# Patient Record
Sex: Male | Born: 1940 | Hispanic: No | Marital: Married | Smoking: Former smoker
Health system: Southern US, Community
[De-identification: ages and names within clinical notes are randomized; demographics above are authoritative.]

## PROBLEM LIST (undated history)

## (undated) DIAGNOSIS — M479 Spondylosis, unspecified: Secondary | ICD-10-CM

## (undated) DIAGNOSIS — K5900 Constipation, unspecified: Secondary | ICD-10-CM

## (undated) DIAGNOSIS — I10 Essential (primary) hypertension: Secondary | ICD-10-CM

## (undated) DIAGNOSIS — E7800 Pure hypercholesterolemia, unspecified: Secondary | ICD-10-CM

## (undated) DIAGNOSIS — Z9289 Personal history of other medical treatment: Secondary | ICD-10-CM

## (undated) DIAGNOSIS — R030 Elevated blood-pressure reading, without diagnosis of hypertension: Secondary | ICD-10-CM

## (undated) HISTORY — PX: OTHER SURGICAL HISTORY: SHX169

## (undated) HISTORY — DX: Pure hypercholesterolemia, unspecified: E78.00

## (undated) HISTORY — DX: Elevated blood-pressure reading, without diagnosis of hypertension: R03.0

## (undated) HISTORY — DX: Personal history of other medical treatment: Z92.89

---

## 2002-03-26 ENCOUNTER — Ambulatory Visit (HOSPITAL_COMMUNITY): Admission: RE | Admit: 2002-03-26 | Discharge: 2002-03-26 | Payer: Self-pay | Admitting: *Deleted

## 2005-08-14 ENCOUNTER — Ambulatory Visit (HOSPITAL_COMMUNITY): Admission: RE | Admit: 2005-08-14 | Discharge: 2005-08-14 | Payer: Self-pay | Admitting: Urology

## 2005-10-08 ENCOUNTER — Inpatient Hospital Stay (HOSPITAL_COMMUNITY): Admission: RE | Admit: 2005-10-08 | Discharge: 2005-10-09 | Payer: Self-pay | Admitting: Urology

## 2010-10-25 ENCOUNTER — Ambulatory Visit (HOSPITAL_BASED_OUTPATIENT_CLINIC_OR_DEPARTMENT_OTHER)
Admission: RE | Admit: 2010-10-25 | Discharge: 2010-10-25 | Disposition: A | Discharge status: Home / Self Care | Payer: Medicare Other | Source: Ambulatory Visit | Attending: Orthopedic Surgery | Admitting: Orthopedic Surgery

## 2010-10-25 ENCOUNTER — Other Ambulatory Visit: Payer: Self-pay | Admitting: Orthopedic Surgery

## 2010-10-25 DIAGNOSIS — Z01812 Encounter for preprocedural laboratory examination: Secondary | ICD-10-CM | POA: Insufficient documentation

## 2010-10-25 DIAGNOSIS — X58XXXA Exposure to other specified factors, initial encounter: Secondary | ICD-10-CM | POA: Insufficient documentation

## 2010-10-25 DIAGNOSIS — Y929 Unspecified place or not applicable: Secondary | ICD-10-CM | POA: Insufficient documentation

## 2010-10-25 DIAGNOSIS — IMO0002 Reserved for concepts with insufficient information to code with codable children: Secondary | ICD-10-CM | POA: Insufficient documentation

## 2010-10-25 DIAGNOSIS — D2110 Benign neoplasm of connective and other soft tissue of unspecified upper limb, including shoulder: Secondary | ICD-10-CM | POA: Insufficient documentation

## 2010-10-25 DIAGNOSIS — S65509A Unspecified injury of blood vessel of unspecified finger, initial encounter: Secondary | ICD-10-CM | POA: Insufficient documentation

## 2010-10-25 DIAGNOSIS — S61209A Unspecified open wound of unspecified finger without damage to nail, initial encounter: Secondary | ICD-10-CM | POA: Insufficient documentation

## 2010-10-25 LAB — POCT HEMOGLOBIN-HEMACUE: Hemoglobin: 15.4 g/dL (ref 13.0–17.0)

## 2010-12-05 NOTE — Op Note (Signed)
NAMEANTION, ANDRES                 ACCOUNT NO.:  [id number]  MEDICAL RECORD NO.:  [id number]  LOCATION:                                 FACILITY:  PHYSICIAN:  Cindee Salt, M.D.            DATE OF BIRTH:  DATE OF PROCEDURE:  10/25/2010 DATE OF DISCHARGE:                              OPERATIVE REPORT   PREOPERATIVE DIAGNOSIS:  Laceration digital artery nerve, right index finger.  POSTOPERATIVE DIAGNOSIS:  Laceration digital artery nerve, right index finger.  OPERATION:  Excision of neuroma, repair of radial digital nerve with conduit, repair of radial digital artery, right index finger.  SURGEON:  Cindee Salt, MD  ANESTHESIA:  General with local infiltration.  ANESTHESIOLOGIST:  Bedelia Person, MD  HISTORY:  The patient is a 70 year old male with a history of a laceration to the palmar aspect metacarpophalangeal joint of the right index finger.  He complains of numbness and tingling on the radial aspect of the digit.  He is admitted now for exploration, repair, possible conduit grafting.  Pre, peri, and postoperative course have been discussed along with risks and complications.  He is aware that there is no guarantee with surgery, possibility of infection, recurrence injury to arteries, nerves, tendons, incomplete relief of symptoms, dystrophy.  In the preoperative area, the patient is seen.  The extremity marked by both the patient and surgeon.  Antibiotic given.  DESCRIPTION OF PROCEDURE:  The patient was brought to the operating room where a general anesthetic was carried out without difficulty.  He was prepped using ChloraPrep, supine position with right arm free.  3-minute dry time was allowed.  Time-out taken confirming the patient and procedure.  The limb was exsanguinated with an Esmarch bandage. Tourniquet placed high and the arm was then plated to 250 mmHg.  A volar Brunner incision was made, carried down through subcutaneous tissue. Bleeders were electrocauterized  with bipolar.  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  The radial digital artery was noted to be lacerated.  The dorsal sensory branch of the nerve had branched, this was protected.  The ulnar digital nerve was intact. Flexor tendons appeared intact.  The operative microscope was brought into position.  The nerve was then traced distally and proximally identifying both stumps.  The neuroma was then excised by grasping this with a cardboard cover from a suture pack.  A 15-blade was used to transect the nerve multiple times to take this back with minimal dissection to fascicles.  This was done over a tongue depressor to protect the underlying structures.  The distal nerve was similarly cut back to normal fascicles removing any scar.  The ends of the artery were cut back to normal intima.  This was then irrigated, dilated, and a repair performed at the back wall first technique with interrupted 9-0 nylon sutures.  A significant gap was present in the nerve on resection of the neuroma despite mobilization of the nerve.  A 3-mm conduit was measured.  This was fit proximally well, distally left a gap in the conduit.  The conduit was then placed, cut short to minimize the gap and  conduit placement.  The proximal end was sutured in with a horizontal mattress suture.  This filled the conduit well.  Conduit was then irrigated with saline.  The distal end was then sutured in similarly with horizontal mattress suture bringing it into the conduit.  A fat pad plug was then placed distally to decrease the space present between the actual nerve and conduit.  The wound was copiously irrigated with saline.  The skin then closed with interrupted of 5-0 Vicryl Rapide sutures.  A sterile compressive dressing of dorsal splint forearm based was placed maintaining the finger in a flexed position.  On deflation of the tourniquet, all fingers immediately pinked, and he was taken to the recovery  room for observation in a satisfactory condition.  He will be discharged home to return to the [facility] [place] in 1 week on Vicodin.          ______________________________ Cindee Salt, M.D.     GK/MEDQ  D:  10/25/2010  T:  10/25/2010  Job:  [id number]  Electronically Signed by Cindee Salt M.D. on 12/05/2010 04:38:34 PM

## 2011-03-30 DIAGNOSIS — E785 Hyperlipidemia, unspecified: Secondary | ICD-10-CM | POA: Diagnosis not present

## 2011-03-30 DIAGNOSIS — Z125 Encounter for screening for malignant neoplasm of prostate: Secondary | ICD-10-CM | POA: Diagnosis not present

## 2011-03-30 DIAGNOSIS — R7301 Impaired fasting glucose: Secondary | ICD-10-CM | POA: Diagnosis not present

## 2011-04-06 DIAGNOSIS — G252 Other specified forms of tremor: Secondary | ICD-10-CM | POA: Diagnosis not present

## 2011-04-06 DIAGNOSIS — G250 Essential tremor: Secondary | ICD-10-CM | POA: Diagnosis not present

## 2011-04-06 DIAGNOSIS — M459 Ankylosing spondylitis of unspecified sites in spine: Secondary | ICD-10-CM | POA: Diagnosis not present

## 2011-04-06 DIAGNOSIS — E785 Hyperlipidemia, unspecified: Secondary | ICD-10-CM | POA: Diagnosis not present

## 2011-04-06 DIAGNOSIS — Z23 Encounter for immunization: Secondary | ICD-10-CM | POA: Diagnosis not present

## 2011-04-06 DIAGNOSIS — R7301 Impaired fasting glucose: Secondary | ICD-10-CM | POA: Diagnosis not present

## 2011-07-04 DIAGNOSIS — M1990 Unspecified osteoarthritis, unspecified site: Secondary | ICD-10-CM | POA: Diagnosis not present

## 2011-07-04 DIAGNOSIS — M25549 Pain in joints of unspecified hand: Secondary | ICD-10-CM | POA: Diagnosis not present

## 2011-07-04 DIAGNOSIS — M79609 Pain in unspecified limb: Secondary | ICD-10-CM | POA: Diagnosis not present

## 2011-07-04 DIAGNOSIS — M19079 Primary osteoarthritis, unspecified ankle and foot: Secondary | ICD-10-CM | POA: Diagnosis not present

## 2011-07-04 DIAGNOSIS — M19049 Primary osteoarthritis, unspecified hand: Secondary | ICD-10-CM | POA: Diagnosis not present

## 2011-07-04 DIAGNOSIS — M459 Ankylosing spondylitis of unspecified sites in spine: Secondary | ICD-10-CM | POA: Diagnosis not present

## 2011-10-04 DIAGNOSIS — Z961 Presence of intraocular lens: Secondary | ICD-10-CM | POA: Diagnosis not present

## 2011-10-04 DIAGNOSIS — R7301 Impaired fasting glucose: Secondary | ICD-10-CM | POA: Diagnosis not present

## 2011-10-04 DIAGNOSIS — E785 Hyperlipidemia, unspecified: Secondary | ICD-10-CM | POA: Diagnosis not present

## 2011-11-01 DIAGNOSIS — G252 Other specified forms of tremor: Secondary | ICD-10-CM | POA: Diagnosis not present

## 2011-11-01 DIAGNOSIS — G250 Essential tremor: Secondary | ICD-10-CM | POA: Diagnosis not present

## 2011-11-01 DIAGNOSIS — E785 Hyperlipidemia, unspecified: Secondary | ICD-10-CM | POA: Diagnosis not present

## 2011-11-01 DIAGNOSIS — R7301 Impaired fasting glucose: Secondary | ICD-10-CM | POA: Diagnosis not present

## 2011-11-01 DIAGNOSIS — N182 Chronic kidney disease, stage 2 (mild): Secondary | ICD-10-CM | POA: Diagnosis not present

## 2011-11-12 DIAGNOSIS — M159 Polyosteoarthritis, unspecified: Secondary | ICD-10-CM | POA: Diagnosis not present

## 2011-11-12 DIAGNOSIS — M459 Ankylosing spondylitis of unspecified sites in spine: Secondary | ICD-10-CM | POA: Diagnosis not present

## 2011-11-16 DIAGNOSIS — C61 Malignant neoplasm of prostate: Secondary | ICD-10-CM | POA: Diagnosis not present

## 2011-11-23 DIAGNOSIS — C61 Malignant neoplasm of prostate: Secondary | ICD-10-CM | POA: Diagnosis not present

## 2011-11-23 DIAGNOSIS — N529 Male erectile dysfunction, unspecified: Secondary | ICD-10-CM | POA: Diagnosis not present

## 2012-01-16 DIAGNOSIS — Z23 Encounter for immunization: Secondary | ICD-10-CM | POA: Diagnosis not present

## 2012-01-16 DIAGNOSIS — L821 Other seborrheic keratosis: Secondary | ICD-10-CM | POA: Diagnosis not present

## 2012-01-16 DIAGNOSIS — D485 Neoplasm of uncertain behavior of skin: Secondary | ICD-10-CM | POA: Diagnosis not present

## 2012-01-16 DIAGNOSIS — L723 Sebaceous cyst: Secondary | ICD-10-CM | POA: Diagnosis not present

## 2012-02-11 DIAGNOSIS — L570 Actinic keratosis: Secondary | ICD-10-CM | POA: Diagnosis not present

## 2012-02-11 DIAGNOSIS — D239 Other benign neoplasm of skin, unspecified: Secondary | ICD-10-CM | POA: Diagnosis not present

## 2012-02-11 DIAGNOSIS — L820 Inflamed seborrheic keratosis: Secondary | ICD-10-CM | POA: Diagnosis not present

## 2012-04-21 DIAGNOSIS — E785 Hyperlipidemia, unspecified: Secondary | ICD-10-CM | POA: Diagnosis not present

## 2012-04-21 DIAGNOSIS — R7301 Impaired fasting glucose: Secondary | ICD-10-CM | POA: Diagnosis not present

## 2012-04-21 DIAGNOSIS — Z125 Encounter for screening for malignant neoplasm of prostate: Secondary | ICD-10-CM | POA: Diagnosis not present

## 2012-04-29 DIAGNOSIS — G250 Essential tremor: Secondary | ICD-10-CM | POA: Diagnosis not present

## 2012-04-29 DIAGNOSIS — R7301 Impaired fasting glucose: Secondary | ICD-10-CM | POA: Diagnosis not present

## 2012-04-29 DIAGNOSIS — N182 Chronic kidney disease, stage 2 (mild): Secondary | ICD-10-CM | POA: Diagnosis not present

## 2012-04-29 DIAGNOSIS — E785 Hyperlipidemia, unspecified: Secondary | ICD-10-CM | POA: Diagnosis not present

## 2012-09-16 DIAGNOSIS — M19029 Primary osteoarthritis, unspecified elbow: Secondary | ICD-10-CM | POA: Diagnosis not present

## 2012-09-16 DIAGNOSIS — M25529 Pain in unspecified elbow: Secondary | ICD-10-CM | POA: Diagnosis not present

## 2012-09-24 DIAGNOSIS — M25529 Pain in unspecified elbow: Secondary | ICD-10-CM | POA: Diagnosis not present

## 2012-09-24 DIAGNOSIS — M459 Ankylosing spondylitis of unspecified sites in spine: Secondary | ICD-10-CM | POA: Diagnosis not present

## 2012-09-24 DIAGNOSIS — M159 Polyosteoarthritis, unspecified: Secondary | ICD-10-CM | POA: Diagnosis not present

## 2012-10-24 DIAGNOSIS — R7301 Impaired fasting glucose: Secondary | ICD-10-CM | POA: Diagnosis not present

## 2012-10-24 DIAGNOSIS — E785 Hyperlipidemia, unspecified: Secondary | ICD-10-CM | POA: Diagnosis not present

## 2012-11-06 DIAGNOSIS — Z961 Presence of intraocular lens: Secondary | ICD-10-CM | POA: Diagnosis not present

## 2012-11-06 DIAGNOSIS — R7301 Impaired fasting glucose: Secondary | ICD-10-CM | POA: Diagnosis not present

## 2012-11-06 DIAGNOSIS — H43819 Vitreous degeneration, unspecified eye: Secondary | ICD-10-CM | POA: Diagnosis not present

## 2012-11-06 DIAGNOSIS — H04129 Dry eye syndrome of unspecified lacrimal gland: Secondary | ICD-10-CM | POA: Diagnosis not present

## 2012-11-06 DIAGNOSIS — N182 Chronic kidney disease, stage 2 (mild): Secondary | ICD-10-CM | POA: Diagnosis not present

## 2012-11-06 DIAGNOSIS — G250 Essential tremor: Secondary | ICD-10-CM | POA: Diagnosis not present

## 2012-11-06 DIAGNOSIS — E785 Hyperlipidemia, unspecified: Secondary | ICD-10-CM | POA: Diagnosis not present

## 2012-11-19 DIAGNOSIS — M25529 Pain in unspecified elbow: Secondary | ICD-10-CM | POA: Diagnosis not present

## 2012-11-19 DIAGNOSIS — M19029 Primary osteoarthritis, unspecified elbow: Secondary | ICD-10-CM | POA: Diagnosis not present

## 2012-12-01 DIAGNOSIS — M25529 Pain in unspecified elbow: Secondary | ICD-10-CM | POA: Diagnosis not present

## 2012-12-08 DIAGNOSIS — M25529 Pain in unspecified elbow: Secondary | ICD-10-CM | POA: Diagnosis not present

## 2013-01-09 DIAGNOSIS — Z23 Encounter for immunization: Secondary | ICD-10-CM | POA: Diagnosis not present

## 2013-01-21 DIAGNOSIS — K219 Gastro-esophageal reflux disease without esophagitis: Secondary | ICD-10-CM | POA: Diagnosis not present

## 2013-01-21 DIAGNOSIS — Z1211 Encounter for screening for malignant neoplasm of colon: Secondary | ICD-10-CM | POA: Diagnosis not present

## 2013-02-10 DIAGNOSIS — L570 Actinic keratosis: Secondary | ICD-10-CM | POA: Diagnosis not present

## 2013-02-19 DIAGNOSIS — Z1211 Encounter for screening for malignant neoplasm of colon: Secondary | ICD-10-CM | POA: Diagnosis not present

## 2013-02-19 DIAGNOSIS — K6389 Other specified diseases of intestine: Secondary | ICD-10-CM | POA: Diagnosis not present

## 2013-02-19 DIAGNOSIS — K5000 Crohn's disease of small intestine without complications: Secondary | ICD-10-CM | POA: Diagnosis not present

## 2013-02-19 DIAGNOSIS — K5730 Diverticulosis of large intestine without perforation or abscess without bleeding: Secondary | ICD-10-CM | POA: Diagnosis not present

## 2013-02-19 DIAGNOSIS — K5289 Other specified noninfective gastroenteritis and colitis: Secondary | ICD-10-CM | POA: Diagnosis not present

## 2013-02-19 DIAGNOSIS — K649 Unspecified hemorrhoids: Secondary | ICD-10-CM | POA: Diagnosis not present

## 2013-03-10 DIAGNOSIS — H8110 Benign paroxysmal vertigo, unspecified ear: Secondary | ICD-10-CM | POA: Diagnosis not present

## 2013-04-03 DIAGNOSIS — R42 Dizziness and giddiness: Secondary | ICD-10-CM | POA: Diagnosis not present

## 2013-04-03 DIAGNOSIS — I69998 Other sequelae following unspecified cerebrovascular disease: Secondary | ICD-10-CM | POA: Diagnosis not present

## 2013-04-23 DIAGNOSIS — L570 Actinic keratosis: Secondary | ICD-10-CM | POA: Diagnosis not present

## 2013-06-25 DIAGNOSIS — E785 Hyperlipidemia, unspecified: Secondary | ICD-10-CM | POA: Diagnosis not present

## 2013-06-25 DIAGNOSIS — R7301 Impaired fasting glucose: Secondary | ICD-10-CM | POA: Diagnosis not present

## 2013-06-25 DIAGNOSIS — Z125 Encounter for screening for malignant neoplasm of prostate: Secondary | ICD-10-CM | POA: Diagnosis not present

## 2013-07-02 DIAGNOSIS — E785 Hyperlipidemia, unspecified: Secondary | ICD-10-CM | POA: Diagnosis not present

## 2013-07-02 DIAGNOSIS — R7301 Impaired fasting glucose: Secondary | ICD-10-CM | POA: Diagnosis not present

## 2013-07-02 DIAGNOSIS — N182 Chronic kidney disease, stage 2 (mild): Secondary | ICD-10-CM | POA: Diagnosis not present

## 2013-07-02 DIAGNOSIS — G250 Essential tremor: Secondary | ICD-10-CM | POA: Diagnosis not present

## 2013-09-24 DIAGNOSIS — M459 Ankylosing spondylitis of unspecified sites in spine: Secondary | ICD-10-CM | POA: Diagnosis not present

## 2013-09-24 DIAGNOSIS — M159 Polyosteoarthritis, unspecified: Secondary | ICD-10-CM | POA: Diagnosis not present

## 2013-09-24 DIAGNOSIS — M25529 Pain in unspecified elbow: Secondary | ICD-10-CM | POA: Diagnosis not present

## 2013-11-13 DIAGNOSIS — C61 Malignant neoplasm of prostate: Secondary | ICD-10-CM | POA: Diagnosis not present

## 2013-11-20 DIAGNOSIS — N529 Male erectile dysfunction, unspecified: Secondary | ICD-10-CM | POA: Diagnosis not present

## 2013-11-20 DIAGNOSIS — C61 Malignant neoplasm of prostate: Secondary | ICD-10-CM | POA: Diagnosis not present

## 2014-01-01 DIAGNOSIS — E785 Hyperlipidemia, unspecified: Secondary | ICD-10-CM | POA: Diagnosis not present

## 2014-01-01 DIAGNOSIS — R7301 Impaired fasting glucose: Secondary | ICD-10-CM | POA: Diagnosis not present

## 2014-01-08 DIAGNOSIS — E785 Hyperlipidemia, unspecified: Secondary | ICD-10-CM | POA: Diagnosis not present

## 2014-01-08 DIAGNOSIS — Z23 Encounter for immunization: Secondary | ICD-10-CM | POA: Diagnosis not present

## 2014-01-08 DIAGNOSIS — R7301 Impaired fasting glucose: Secondary | ICD-10-CM | POA: Diagnosis not present

## 2014-01-08 DIAGNOSIS — G250 Essential tremor: Secondary | ICD-10-CM | POA: Diagnosis not present

## 2014-01-08 DIAGNOSIS — N182 Chronic kidney disease, stage 2 (mild): Secondary | ICD-10-CM | POA: Diagnosis not present

## 2014-02-09 DIAGNOSIS — L821 Other seborrheic keratosis: Secondary | ICD-10-CM | POA: Diagnosis not present

## 2014-02-09 DIAGNOSIS — L570 Actinic keratosis: Secondary | ICD-10-CM | POA: Diagnosis not present

## 2014-03-03 DIAGNOSIS — L570 Actinic keratosis: Secondary | ICD-10-CM | POA: Diagnosis not present

## 2014-07-02 DIAGNOSIS — Z0000 Encounter for general adult medical examination without abnormal findings: Secondary | ICD-10-CM | POA: Diagnosis not present

## 2014-07-02 DIAGNOSIS — Z23 Encounter for immunization: Secondary | ICD-10-CM | POA: Diagnosis not present

## 2014-07-02 DIAGNOSIS — R7309 Other abnormal glucose: Secondary | ICD-10-CM | POA: Diagnosis not present

## 2014-07-02 DIAGNOSIS — Z125 Encounter for screening for malignant neoplasm of prostate: Secondary | ICD-10-CM | POA: Diagnosis not present

## 2014-07-02 DIAGNOSIS — E785 Hyperlipidemia, unspecified: Secondary | ICD-10-CM | POA: Diagnosis not present

## 2014-07-02 DIAGNOSIS — E663 Overweight: Secondary | ICD-10-CM | POA: Diagnosis not present

## 2014-07-02 DIAGNOSIS — R7301 Impaired fasting glucose: Secondary | ICD-10-CM | POA: Diagnosis not present

## 2014-07-09 DIAGNOSIS — R7309 Other abnormal glucose: Secondary | ICD-10-CM | POA: Diagnosis not present

## 2014-07-09 DIAGNOSIS — N182 Chronic kidney disease, stage 2 (mild): Secondary | ICD-10-CM | POA: Diagnosis not present

## 2014-07-09 DIAGNOSIS — E785 Hyperlipidemia, unspecified: Secondary | ICD-10-CM | POA: Diagnosis not present

## 2014-07-09 DIAGNOSIS — G250 Essential tremor: Secondary | ICD-10-CM | POA: Diagnosis not present

## 2014-09-13 DIAGNOSIS — M5136 Other intervertebral disc degeneration, lumbar region: Secondary | ICD-10-CM | POA: Diagnosis not present

## 2014-09-13 DIAGNOSIS — M1712 Unilateral primary osteoarthritis, left knee: Secondary | ICD-10-CM | POA: Diagnosis not present

## 2014-09-24 DIAGNOSIS — M150 Primary generalized (osteo)arthritis: Secondary | ICD-10-CM | POA: Diagnosis not present

## 2014-09-24 DIAGNOSIS — M450 Ankylosing spondylitis of multiple sites in spine: Secondary | ICD-10-CM | POA: Diagnosis not present

## 2014-12-29 DIAGNOSIS — Z23 Encounter for immunization: Secondary | ICD-10-CM | POA: Diagnosis not present

## 2015-01-07 DIAGNOSIS — E785 Hyperlipidemia, unspecified: Secondary | ICD-10-CM | POA: Diagnosis not present

## 2015-01-07 DIAGNOSIS — R7309 Other abnormal glucose: Secondary | ICD-10-CM | POA: Diagnosis not present

## 2015-01-07 DIAGNOSIS — Z8546 Personal history of malignant neoplasm of prostate: Secondary | ICD-10-CM | POA: Diagnosis not present

## 2015-01-12 DIAGNOSIS — L821 Other seborrheic keratosis: Secondary | ICD-10-CM | POA: Diagnosis not present

## 2015-01-12 DIAGNOSIS — D1801 Hemangioma of skin and subcutaneous tissue: Secondary | ICD-10-CM | POA: Diagnosis not present

## 2015-01-14 DIAGNOSIS — N182 Chronic kidney disease, stage 2 (mild): Secondary | ICD-10-CM | POA: Diagnosis not present

## 2015-01-14 DIAGNOSIS — E785 Hyperlipidemia, unspecified: Secondary | ICD-10-CM | POA: Diagnosis not present

## 2015-01-14 DIAGNOSIS — G250 Essential tremor: Secondary | ICD-10-CM | POA: Diagnosis not present

## 2015-01-14 DIAGNOSIS — R7309 Other abnormal glucose: Secondary | ICD-10-CM | POA: Diagnosis not present

## 2015-07-14 DIAGNOSIS — Z125 Encounter for screening for malignant neoplasm of prostate: Secondary | ICD-10-CM | POA: Diagnosis not present

## 2015-07-14 DIAGNOSIS — E785 Hyperlipidemia, unspecified: Secondary | ICD-10-CM | POA: Diagnosis not present

## 2015-07-14 DIAGNOSIS — R7309 Other abnormal glucose: Secondary | ICD-10-CM | POA: Diagnosis not present

## 2016-04-04 ENCOUNTER — Encounter (HOSPITAL_COMMUNITY): Payer: Self-pay

## 2016-04-04 ENCOUNTER — Emergency Department (HOSPITAL_COMMUNITY)
Admission: EM | Admit: 2016-04-04 | Discharge: 2016-04-04 | Disposition: A | Discharge status: Home / Self Care | Payer: Medicare Other | Attending: Emergency Medicine | Admitting: Emergency Medicine

## 2016-04-04 DIAGNOSIS — Z79899 Other long term (current) drug therapy: Secondary | ICD-10-CM | POA: Insufficient documentation

## 2016-04-04 DIAGNOSIS — R251 Tremor, unspecified: Secondary | ICD-10-CM | POA: Diagnosis present

## 2016-04-04 DIAGNOSIS — T50905A Adverse effect of unspecified drugs, medicaments and biological substances, initial encounter: Secondary | ICD-10-CM | POA: Diagnosis not present

## 2016-04-04 DIAGNOSIS — T7840XA Allergy, unspecified, initial encounter: Secondary | ICD-10-CM

## 2016-04-04 NOTE — ED Triage Notes (Signed)
Patient transported via [facility] due to a reaction to a Remicade transfusion he was receiving. This is his 3rd infusion of the medication. About 20-30  minutes into infusion he began having flushing and having shortness of breath. The transfusion was immediately discontinued and the patient was given 50mg  of Benadryl. Patient very hypertensive with systolic BP of over [id number]. EMS called and by the time they arrived the patient was asymptomatic. BP 166/92, 80HR, 94% RA.

## 2016-04-04 NOTE — ED Provider Notes (Signed)
[facility] DEPT Provider Note   CSN: [id number] Arrival date & time: 04/04/16  1551     History   Chief Complaint Chief Complaint  Patient presents with  . Allergic Reaction    HPI Marsel Bazen is a 76 y.o. male.  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 Amil Amen and started having flushing and the sensation of throat swelling and SOB. Denied N/V/D. This was his 3rd infusion and started approximately 20-30 minutes after the infusion started and quickly resolved once the infusion was stopped. He then became shaky all over and was hypertensive to 200s and given 50 benadryl Iv. EMS was called and by the time they arrived, he was asymptomatic. He states he feels normal at this time and his wife states he looks and is acting normal.   History reviewed. No pertinent past medical history.  There are no active problems to display for this patient.   History reviewed. No pertinent surgical history.     Home Medications    Prior to Admission medications   Medication Sig Start Date End Date Taking? Authorizing Provider  acetaminophen (TYLENOL) 500 MG tablet Take 500-1,000 mg by mouth every 6 (six) hours as needed for headache.   Yes Historical Provider, MD  Multiple Vitamins-Minerals (ONE-A-DAY MENS 50+ ADVANTAGE) TABS Take 1 tablet by mouth daily.   Yes Historical Provider, MD  naproxen (NAPROSYN) 500 MG tablet Take 125 mg by mouth daily as needed (for swollen joint pain).  03/07/16  Yes Historical Provider, MD  simvastatin (ZOCOR) 40 MG tablet Take 40 mg by mouth daily. 01/09/16  Yes Historical Provider, MD    Family History No family history on file.  Social History Social History  Substance Use Topics  . Smoking status: Not on file  . Smokeless tobacco: Not on file  . Alcohol use Not on file     Allergies   Remicade [infliximab]   Review of  Systems Review of Systems  Constitutional: Negative for chills and fever.  HENT: Negative for ear pain and sore throat.   Eyes: Negative for pain and visual disturbance.  Respiratory: Negative for cough and shortness of breath.   Cardiovascular: Negative for chest pain and palpitations.  Gastrointestinal: Negative for abdominal pain and vomiting.  Genitourinary: Negative for dysuria and hematuria.  Musculoskeletal: Negative for arthralgias and back pain.  Skin: Negative for color change and rash.  Neurological: Negative for seizures and syncope.  All other systems reviewed and are negative.    Physical Exam Updated Vital Signs BP 139/86   Pulse 76   Ht 6\' 2"  (1.88 m)   Wt 90.4 kg   SpO2 93%   BMI 25.60 kg/m   Physical Exam  Constitutional: He appears well-developed and well-nourished.  HENT:  Head: Normocephalic and atraumatic.  Eyes: Conjunctivae are normal.  Neck: Normal range of motion. Neck supple.  Cardiovascular: Normal rate and regular rhythm.   No murmur heard. Pulmonary/Chest: Effort normal and breath sounds normal. No accessory muscle usage. No tachypnea. No respiratory distress. He has no decreased breath sounds. He has no wheezes.  Abdominal: Soft. He exhibits no distension. There is no tenderness.  Musculoskeletal: He exhibits no edema.  Neurological: He is alert.  Skin: Skin is warm and dry. Capillary refill takes less than 2 seconds. No rash noted.  Psychiatric: He has a normal mood and affect.  Nursing note and vitals reviewed.  ED Treatments / Results  Labs (all labs ordered are listed, but only abnormal results are displayed) Labs Reviewed - No data to display  EKG  EKG Interpretation None       Radiology No results found.  Procedures Procedures (including critical care time)  Medications Ordered in ED Medications - No data to display   Initial Impression / Assessment and Plan / ED Course  I have reviewed the triage vital signs  and the nursing notes.  Pertinent labs & imaging results that were available during my care of the patient were reviewed by me and considered in my medical decision making (see chart for details).    76 year old male presenting after an allergic reaction to a Remicade infusion. He states that he became flushed and felt like his throat was swelling 20 minutes after the infusion started and it went away. He was given 50 mg IV Benadryl. When EMS arrived he was a synthetic. On arrival here he is afebrile and hemodynamically stable. His exam is unremarkable. He is still a symptomatically this time. No indication for further medicines such as steroids or epinephrine. Will observe for 3 hours to ensure no return of symptoms.  Patient has not had any return of symptoms. He has not had any nausea, vomiting, shortness of breath, sensation of throat swelling, flushing or hives. He is instructed to follow up with his primary doctor and with the rheumatologist to consider a different type medication. He was informed to return to the emergency department if he had return of his symptoms.  Patient care discussed and supervised by my attending, Dr. Ralene Bathe. Drucie Ip, MD   Final Clinical Impressions(s) / ED Diagnoses   Final diagnoses:  Allergic reaction, initial encounter    New Prescriptions New Prescriptions   No medications on file     Cala Kruckenberg [place] Josseline Reddin, MD 04/04/16 Bosie Helper    Quintella Reichert, MD 04/10/16 1002

## 2016-04-04 NOTE — Discharge Instructions (Signed)
Please return to the ED if you have the sensation of your throat swelling, shortness of breath or vomiting.

## 2016-08-11 ENCOUNTER — Encounter (HOSPITAL_COMMUNITY): Payer: Self-pay | Admitting: Emergency Medicine

## 2016-08-11 ENCOUNTER — Emergency Department (HOSPITAL_COMMUNITY)
Admission: EM | Admit: 2016-08-11 | Discharge: 2016-08-11 | Disposition: A | Discharge status: Home / Self Care | Payer: Medicare Other | Attending: Emergency Medicine | Admitting: Emergency Medicine

## 2016-08-11 ENCOUNTER — Emergency Department (HOSPITAL_COMMUNITY): Payer: Medicare Other

## 2016-08-11 DIAGNOSIS — Z79899 Other long term (current) drug therapy: Secondary | ICD-10-CM | POA: Diagnosis not present

## 2016-08-11 DIAGNOSIS — R1032 Left lower quadrant pain: Secondary | ICD-10-CM | POA: Diagnosis present

## 2016-08-11 DIAGNOSIS — I10 Essential (primary) hypertension: Secondary | ICD-10-CM | POA: Insufficient documentation

## 2016-08-11 DIAGNOSIS — R109 Unspecified abdominal pain: Secondary | ICD-10-CM

## 2016-08-11 HISTORY — DX: Essential (primary) hypertension: I10

## 2016-08-11 HISTORY — DX: Spondylosis, unspecified: M47.9

## 2016-08-11 LAB — COMPREHENSIVE METABOLIC PANEL
ALK PHOS: 131 U/L — AB (ref 38–126)
ALT: 28 U/L (ref 17–63)
AST: 28 U/L (ref 15–41)
Albumin: 4 g/dL (ref 3.5–5.0)
Anion gap: 11 (ref 5–15)
BUN: 14 mg/dL (ref 6–20)
CALCIUM: 8.8 mg/dL — AB (ref 8.9–10.3)
CO2: 22 mmol/L (ref 22–32)
CREATININE: 0.85 mg/dL (ref 0.61–1.24)
Chloride: 102 mmol/L (ref 101–111)
Glucose, Bld: 103 mg/dL — ABNORMAL HIGH (ref 65–99)
Potassium: 4.1 mmol/L (ref 3.5–5.1)
Sodium: 135 mmol/L (ref 135–145)
Total Bilirubin: 0.9 mg/dL (ref 0.3–1.2)
Total Protein: 7.5 g/dL (ref 6.5–8.1)

## 2016-08-11 LAB — CBC WITH DIFFERENTIAL/PLATELET
Basophils Absolute: 0 10*3/uL (ref 0.0–0.1)
Basophils Relative: 0 %
Eosinophils Absolute: 0.2 10*3/uL (ref 0.0–0.7)
Eosinophils Relative: 3 %
HEMATOCRIT: 48.3 % (ref 39.0–52.0)
HEMOGLOBIN: 16.8 g/dL (ref 13.0–17.0)
LYMPHS ABS: 2.4 10*3/uL (ref 0.7–4.0)
LYMPHS PCT: 25 %
MCH: 33.4 pg (ref 26.0–34.0)
MCHC: 34.8 g/dL (ref 30.0–36.0)
MCV: 96 fL (ref 78.0–100.0)
Monocytes Absolute: 1 10*3/uL (ref 0.1–1.0)
Monocytes Relative: 11 %
NEUTROS PCT: 61 %
Neutro Abs: 5.7 10*3/uL (ref 1.7–7.7)
Platelets: 315 10*3/uL (ref 150–400)
RBC: 5.03 MIL/uL (ref 4.22–5.81)
RDW: 14.3 % (ref 11.5–15.5)
WBC: 9.4 10*3/uL (ref 4.0–10.5)

## 2016-08-11 LAB — URINALYSIS, ROUTINE W REFLEX MICROSCOPIC
Bilirubin Urine: NEGATIVE
Glucose, UA: NEGATIVE mg/dL
Ketones, ur: 5 mg/dL — AB
Leukocytes, UA: NEGATIVE
NITRITE: NEGATIVE
Protein, ur: >=300 mg/dL — AB
SPECIFIC GRAVITY, URINE: 1.026 (ref 1.005–1.030)
pH: 5 (ref 5.0–8.0)

## 2016-08-11 MED ORDER — ONDANSETRON 4 MG PO TBDP: 4.0000 mg | ORAL_TABLET | Freq: Four times a day (QID) | ORAL | 0 refills | Status: DC | PRN

## 2016-08-11 MED ORDER — SODIUM CHLORIDE 0.9 % IV BOLUS (SEPSIS)
1000.0000 mL | Freq: Once | INTRAVENOUS | Status: AC
  Administered 2016-08-11: 1000 mL via INTRAVENOUS

## 2016-08-11 MED ORDER — DOCUSATE SODIUM 100 MG PO CAPS: 100.0000 mg | ORAL_CAPSULE | Freq: Two times a day (BID) | ORAL | 0 refills | Status: AC

## 2016-08-11 MED ORDER — ONDANSETRON HCL 4 MG/2ML IJ SOLN
4.0000 mg | Freq: Once | INTRAMUSCULAR | Status: AC
  Administered 2016-08-11: 4 mg via INTRAVENOUS
  Filled 2016-08-11: qty 2

## 2016-08-11 MED ORDER — MORPHINE SULFATE (PF) 4 MG/ML IV SOLN
4.0000 mg | Freq: Once | INTRAVENOUS | Status: AC
  Administered 2016-08-11: 4 mg via INTRAVENOUS
  Filled 2016-08-11: qty 1

## 2016-08-11 MED ORDER — HYDROCODONE-ACETAMINOPHEN 5-325 MG PO TABS: 1.0000 | ORAL_TABLET | Freq: Four times a day (QID) | ORAL | 0 refills | Status: DC | PRN

## 2016-08-11 NOTE — ED Provider Notes (Signed)
TIME SEEN: 2:52 AM  By signing my name below, I, Margit Banda, attest that this documentation has been prepared under the direction and in the presence of Giuseppina Quinones, Delice Bison, DO. Electronically Signed: Margit Banda, ED Scribe. 08/11/16. 2:57 AM.  CHIEF COMPLAINT: Flank Pain  HPI: Eduardo Burch is a 76 y.o. male with a PMHx of HTN and spondylosis, who presents to the Emergency Department complaining of constant mild to moderate sharp and achy left flank pain for the last two days. No radiation of pain. No aggravating or relieving factors. Not worse with movement or palpation. Pt reports he is unable to get comfortable because of pain. He has had prostate surgery. No back surgery. No injections in his back. Pt denies nausea, vomiting, fever, dysuria, hematuria, testicle pain, testicle swelling, blood in stool, black tarry stool, numbness, focal weakness, urinary retention and bladder or bowel incontinence. He reports he thinks this is a kidney stone. Has never had a kidney stone before.  ROS: See HPI Constitutional: no fever  Eyes: no drainage  ENT: no runny nose   Cardiovascular:  no chest pain  Resp: no SOB  GI: no vomiting GU: no dysuria Integumentary: no rash  Allergy: no hives  Musculoskeletal: no leg swelling  Neurological: no slurred speech ROS otherwise negative  PAST MEDICAL HISTORY/PAST SURGICAL HISTORY:  Past Medical History:  Diagnosis Date  . Hypertension   . Spondylosis     MEDICATIONS:  Prior to Admission medications   Medication Sig Start Date End Date Taking? Authorizing Provider  acetaminophen (TYLENOL) 500 MG tablet Take 500-1,000 mg by mouth every 6 (six) hours as needed for headache.    [provider]  Multiple Vitamins-Minerals (ONE-A-DAY MENS 50+ ADVANTAGE) TABS Take 1 tablet by mouth daily.    [provider]  naproxen (NAPROSYN) 500 MG tablet Take 125 mg by mouth daily as needed (for swollen joint pain).  03/07/16   [provider]   simvastatin (ZOCOR) 40 MG tablet Take 40 mg by mouth daily. 01/09/16   [provider]    ALLERGIES:  Allergies  Allergen Reactions  . Remicade [Infliximab] Anaphylaxis    SOCIAL HISTORY:  Social History  Substance Use Topics  . Smoking status: Not on file  . Smokeless tobacco: Not on file  . Alcohol use Not on file    FAMILY HISTORY: No family history on file.  EXAM: BP (!) 182/101 (BP Location: Left Arm)   Pulse 96   Temp 97.5 F (36.4 C) (Oral)   Resp 18   Ht 6\' 1"  (1.854 m)   Wt 199 lb (90.3 kg)   SpO2 96%   BMI 26.25 kg/m   CONSTITUTIONAL: Alert and oriented and responds appropriately to questions. Well-appearing; well-nourished; Elderly HEAD: Normocephalic EYES: Conjunctivae clear, pupils appear equal, EOMI ENT: normal nose; moist mucous membranes NECK: Supple, no meningismus, no nuchal rigidity, no LAD  CARD: RRR; S1 and S2 appreciated; no murmurs, no clicks, no rubs, no gallops RESP: Normal chest excursion without splinting or tachypnea; breath sounds clear and equal bilaterally; no wheezes, no rhonchi, no rales, no hypoxia or respiratory distress, speaking full sentences ABD/GI: Normal bowel sounds; non-distended; soft, non-tender, no rebound, no guarding, no peritoneal signs, no hepatosplenomegaly BACK:  The back appears normal and is non-tender to palpation, there is no CVA tenderness, no midline tenderness, step-off or deformity.  EXT: Normal ROM in all joints; non-tender to palpation; no edema; normal capillary refill; no cyanosis, no calf tenderness or swelling  SKIN: Normal color for age and race; warm; no rash NEURO: Moves all extremities equally, Sensation to light touch intact diffusely, cranial nerves II through XII intact, normal speech, normal gait, no saddle anesthesia PSYCH: The patient's mood and manner are appropriate. Grooming and personal hygiene are appropriate.  MEDICAL DECISION MAKING: Patient here with left lower back pain. Not  reproducible with palpation. No focal neurologic deficits. No back injury. He is concerned this could be a kidney stone. Discussed with him the differential includes kidney stones, pyelonephritis, UTI, musculoskeletal pain. We'll obtain labs, urine and CT of his abdomen and pelvis. We'll give IV fluids, pain and nausea medicine.  ED PROGRESS: Patient's labs unremarkable. Urine shows no obvious sign of infection or significant amount of blood. CT scan shows perinephric stranding but no hydronephrosis, ureterolithiasis, nephrolithiasis, obvious sign of pyelonephritis. Discuss with him that this could represent urinary tract infection versus a passed kidney stone. He states he did urinate a large amount just prior to arrival and now thinks that he could've passed a stone. He reports feeling much better. At this time I do not feel he needs to be started on antibiotics but I will send urine culture. We'll discharge with prescriptions of Vicodin and Zofran for symptom medical relief at home and have advised him to follow-up with his primary care physician. Discussed return precautions. Patient is comfortable with this plan.  At this time, I do not feel there is any life-threatening condition present. I have reviewed and discussed all results (EKG, imaging, lab, urine as appropriate) and exam findings with patient/family. I have reviewed nursing notes and appropriate previous records.  I feel the patient is safe to be discharged home without further emergent workup and can continue workup as an outpatient as needed. Discussed usual and customary return precautions. Patient/family verbalize understanding and are comfortable with this plan.  Outpatient follow-up has been provided if needed. All questions have been answered.  I personally performed the services described in this documentation, which was scribed in my presence. The recorded information has been reviewed and is accurate.     Jakota Manthei, Delice Bison,  DO 08/11/16 [phone]

## 2016-08-11 NOTE — ED Notes (Signed)
Patient transported to CT 

## 2016-08-11 NOTE — ED Triage Notes (Signed)
Pt reports bilateral flank pain (worse on right) starting 2 days ago. Denies urinary S/S, N/V.

## 2016-08-11 NOTE — Discharge Instructions (Signed)
You were seen in the emergency department for left back pain. This could be a passed kidney stone or an early urinary tract infection. We have sent a urine culture. If it grows bacteria, you will be contacted and started on antibiotics. If your pain continues, please follow-up with your primary care physician. If you develop fever 100.4 higher, vomiting and cannot stop, have increased pain, please return to the hospital.

## 2016-08-12 LAB — URINE CULTURE

## 2016-08-15 DIAGNOSIS — I10 Essential (primary) hypertension: Secondary | ICD-10-CM | POA: Insufficient documentation

## 2016-08-15 NOTE — Progress Notes (Signed)
Cardiology Office Note    Date:  08/16/2016   ID:  Eduardo Burch, DOB [birth year], MRN [id number]  PCP:  Lavone Orn, MD  Cardiologist: Eduardo Grooms, MD   Chief Complaint  Patient presents with  . Advice Only    Hypertension    History of Present Illness:  Eduardo Burch is a 76 y.o. male referred for consultation by Dr.Rupashree Varadarajan concerning elevated blood pressure, with no prior history of hypertension.  The patient is accompanied by his wife. I am his sister-in-law's cardiologists. He is referred because of labile blood pressure elevation, mostly noted when measurements are made on his home device. He is concerned that they fluctuate quite wildly with values as high as 185/100 mmHg. Some of the recordings have been made during episodes of back pain which is been an issue lately. The etiology is unclear.  He has ankylosing spondylitis and first became aware of blood pressure issues after an infusion of Remicade was associated with a reaction that included headache, erythema, and severe blood pressure elevation. This was during his last treatment in January. The medication has subsequently been discontinued. He was given Benadryl immediately. I don't have details on the exact interpretation of the response to the medication. His wife states "anaphylaxis". It would be unusual to have hypertension as part of an anaphylactic reaction. Emergency room note for the urgent visit ED after Remicade infusion:  "76yo M with a reported history of rheumatoid arthritis and ankylosing spondylitis who was receiving a remicade infusion at [facility] with Dr. Amil Amen and started having flushing and the sensation of throat swelling and SOB. Denied N/V/D. This was his 3rd infusion and started approximately 20-30 minutes after the infusion started and quickly resolved once the infusion was stopped. He then became shaky all over and was hypertensive to 200s and given 50 benadryl Iv. EMS  was called and by the time they arrived, he was asymptomatic. He states he feels normal at this time and his wife states he looks and is acting normal. "  The patient has a tremor. He and his wife admitted to daily drinking greater than 2 drinks but endorse discontinuation as of 2 weeks ago. Stopped smoking cigarettes greater than 30 years ago.  Past Medical History:  Diagnosis Date  . Elevated blood pressure reading   . 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 Hypercholesterolemia   . Hypertension   . Spondylosis     Past Surgical History:  Procedure Laterality Date  . PROATATECTOMY      Current Medications: Outpatient Medications Prior to Visit  Medication Sig Dispense Refill  . acetaminophen (TYLENOL) 500 MG tablet Take 500-1,000 mg by mouth every 6 (six) hours as needed for headache.    . docusate sodium (COLACE) 100 MG capsule Take 1 capsule (100 mg total) by mouth every 12 (twelve) hours. 60 capsule 0  . Glucosamine-Chondroit-Vit C-Mn (GLUCOSAMINE 1500 COMPLEX) CAPS Take 1 capsule by mouth daily.     . Multiple Vitamins-Minerals (ONE-A-DAY MENS 50+ ADVANTAGE) TABS Take 1 tablet by mouth daily.    . naproxen (NAPROSYN) 500 MG tablet Take 125 mg by mouth daily as needed (for swollen joint pain).     . ondansetron (ZOFRAN ODT) 4 MG disintegrating tablet Take 1 tablet (4 mg total) by mouth every 6 (six) hours as needed for nausea or vomiting. 20 tablet 0  . simvastatin (ZOCOR) 40 MG tablet Take 40 mg by mouth daily.    Marland Kitchen  HYDROcodone-acetaminophen (NORCO/VICODIN) 5-325 MG tablet Take 1-2 tablets by mouth every 6 (six) hours as needed. (Patient not taking: Reported on 08/16/2016) 15 tablet 0   No facility-administered medications prior to visit.      Allergies:   Remicade [infliximab]   Social History   Social History  . Marital status: Married    Spouse name: N/A  . Number of children: N/A  . Years of education: N/A   Social History Main Topics  .  Smoking status: Former Smoker    Types: Cigarettes  . Smokeless tobacco: Never Used  . Alcohol use None  . Drug use: Unknown  . Sexual activity: Not Asked   Other Topics Concern  . None   Social History Narrative  . None     Family History:  The patient's family history includes Lung cancer in his mother; Spondylitis in his father.   ROS:   Please see the history of present illness.    Recent daily alcohol use. Long-standing history of spondylitis with the family history of such (father). Lower back discomfort that he equates with spondylitis and/or a kidney problem.  All other systems reviewed and are negative.   PHYSICAL EXAM:   VS:  BP 140/86 (BP Location: Right Arm)   Pulse (!) 106   Ht 6\' 2"  (1.88 m)   Wt 197 lb 1.9 oz (89.4 kg)   BMI 25.31 kg/m    GEN: Well nourished, well developed, in no acute distress . Generally anxious appearing HEENT: normal  Neck: no JVD, carotid bruits, or masses Cardiac: Tachycardia with RRR; no murmurs, rubs, or gallops,no edema . Respiratory:  clear to auscultation bilaterally, normal work of breathing GI: soft, nontender, nondistended, + BS MS: no deformity or atrophy  Skin: warm and dry, no rash Neuro:  Alert and Oriented x 3, Strength and sensation are intact. Noticeable tremor. Psych: euthymic mood, full affect  Wt Readings from Last 3 Encounters:  08/16/16 197 lb 1.9 oz (89.4 kg)  08/11/16 199 lb (90.3 kg)  04/04/16 199 lb 6.4 oz (90.4 kg)      Studies/Labs Reviewed:   EKG:  EKG  Sinus tachycardia, left axis deviation, Q waves in V2 through the 5. Probably insignificant.  Recent Labs: 08/11/2016: ALT 28; BUN 14; Creatinine, Ser 0.85; Hemoglobin 16.8; Platelets 315; Potassium 4.1; Sodium 135   Lipid Panel No results found for: CHOL, TRIG, HDL, CHOLHDL, VLDL, LDLCALC, LDLDIRECT  Additional studies/ records that were reviewed today include:  Reviewed records from [place] with recent hemoglobin of 16.7,  Constipation has been  an issue. Recent urinalysis revealed trace blood in the urine. A troponin I was 0.04.   ASSESSMENT:    1. Hypertension, essential   2. Tachycardia   3. Ankylosing spondylitis, unspecified site of spine ([place])   4. Prostate cancer ([place])      PLAN:  In order of problems listed above:  1. Elevated blood pressure although not significantly elevated today. Lability of pressures could be related to situational problems at the time the recordings are done. I've instructed him to continue to monitor blood pressures one to 2 hours after awakening and in the evening. Supply blood pressure recordings to once. 2-D Doppler echocardiogram will be done to assess integrity of the heart and to rule out left ventricular hypertrophy. No specific therapy is warranted at this time. I did discuss reducing salt in the diet, eliminating alcohol, and not over measuring the blood pressure. 2. Not sure why he has tachycardia. A TSH  will be done. The echo also help to exclude the possibility of systolic dysfunction that is unrecognized. No murmurs present and I therefore doubt the possibility of aortic valve disease related to his ankylosing spondylitis. 3. Not discussed 4. Not discussed    Patient appears to be quite anxious and along with his tremor gives me some concern about the possibility of alcohol overuse. Have recommended continued blood pressure recordings, abstinence from alcohol, 2-D Doppler echocardiogram, TSH, and further clinical follow-up based upon findings from the data base the we are establishing. May need therapy for blood pressure. Beta blocker therapy may be reasonable given his rapid heart rate. No specific therapy is started at this time.  Medication Adjustments/Labs and Tests Ordered: Current medicines are reviewed at length with the patient today.  Concerns regarding medicines are outlined above.  Medication changes, Labs and Tests ordered today are listed in the Patient Instructions  below. Patient Instructions  Medication Instructions:  None  Labwork: TSH and CMET today  Testing/Procedures: Your physician has requested that you have an echocardiogram. Echocardiography is a painless test that uses sound waves to create images of your heart. It provides your doctor with information about the size and shape of your heart and how well your heart's chambers and valves are working. This procedure takes approximately one hour. There are no restrictions for this procedure.   Follow-Up: Your physician recommends that you schedule a follow-up appointment as needed with Dr. Tamala Julian.    Any Other Special Instructions Will Be Listed Below (If Applicable).  Monitor your blood pressure twice daily for 7-10 days and call with those BP readings.  We would like for your blood pressure to be 140/90 or less.     If you need a refill on your cardiac medications before your next appointment, please call your pharmacy.      Signed, Eduardo Grooms, MD  08/16/2016 12:30 PM    [facility] [address], [place], [place]  [address] Phone: [phone]; Fax: [phone]

## 2016-08-16 ENCOUNTER — Encounter (INDEPENDENT_AMBULATORY_CARE_PROVIDER_SITE_OTHER): Payer: Self-pay

## 2016-08-16 ENCOUNTER — Encounter: Payer: Self-pay | Admitting: Interventional Cardiology

## 2016-08-16 ENCOUNTER — Ambulatory Visit (INDEPENDENT_AMBULATORY_CARE_PROVIDER_SITE_OTHER): Payer: Medicare Other | Admitting: Interventional Cardiology

## 2016-08-16 VITALS — BP 140/86 | HR 106 | Ht 74.0 in | Wt 197.1 lb

## 2016-08-16 DIAGNOSIS — I10 Essential (primary) hypertension: Secondary | ICD-10-CM | POA: Diagnosis not present

## 2016-08-16 DIAGNOSIS — C61 Malignant neoplasm of prostate: Secondary | ICD-10-CM | POA: Diagnosis not present

## 2016-08-16 DIAGNOSIS — R000 Tachycardia, unspecified: Secondary | ICD-10-CM | POA: Diagnosis not present

## 2016-08-16 DIAGNOSIS — M459 Ankylosing spondylitis of unspecified sites in spine: Secondary | ICD-10-CM

## 2016-08-16 LAB — COMPREHENSIVE METABOLIC PANEL
A/G RATIO: 1.6 (ref 1.2–2.2)
ALT: 40 IU/L (ref 0–44)
AST: 32 IU/L (ref 0–40)
Albumin: 4.6 g/dL (ref 3.5–4.8)
Alkaline Phosphatase: 148 IU/L — ABNORMAL HIGH (ref 39–117)
BILIRUBIN TOTAL: 0.7 mg/dL (ref 0.0–1.2)
BUN / CREAT RATIO: 18 (ref 10–24)
BUN: 20 mg/dL (ref 8–27)
CALCIUM: 9.5 mg/dL (ref 8.6–10.2)
CHLORIDE: 98 mmol/L (ref 96–106)
CO2: 21 mmol/L (ref 20–29)
Creatinine, Ser: 1.14 mg/dL (ref 0.76–1.27)
GFR, EST AFRICAN AMERICAN: 71 mL/min/{1.73_m2} (ref >59)
GFR, EST NON AFRICAN AMERICAN: 62 mL/min/{1.73_m2} (ref >59)
GLOBULIN, TOTAL: 2.8 g/dL (ref 1.5–4.5)
Glucose: 127 mg/dL — ABNORMAL HIGH (ref 65–99)
POTASSIUM: 4.6 mmol/L (ref 3.5–5.2)
SODIUM: 139 mmol/L (ref 134–144)
TOTAL PROTEIN: 7.4 g/dL (ref 6.0–8.5)

## 2016-08-16 LAB — TSH: TSH: 2.53 u[IU]/mL (ref 0.450–4.500)

## 2016-08-16 NOTE — Patient Instructions (Signed)
Medication Instructions:  None  Labwork: TSH and CMET today  Testing/Procedures: Your physician has requested that you have an echocardiogram. Echocardiography is a painless test that uses sound waves to create images of your heart. It provides your doctor with information about the size and shape of your heart and how well your heart's chambers and valves are working. This procedure takes approximately one hour. There are no restrictions for this procedure.   Follow-Up: Your physician recommends that you schedule a follow-up appointment as needed with Dr. Tamala Julian.    Any Other Special Instructions Will Be Listed Below (If Applicable).  Monitor your blood pressure twice daily for 7-10 days and call with those BP readings.  We would like for your blood pressure to be 140/90 or less.     If you need a refill on your cardiac medications before your next appointment, please call your pharmacy.

## 2016-08-20 ENCOUNTER — Other Ambulatory Visit (HOSPITAL_COMMUNITY): Payer: Self-pay | Admitting: Internal Medicine

## 2016-08-20 DIAGNOSIS — C61 Malignant neoplasm of prostate: Secondary | ICD-10-CM

## 2016-08-20 DIAGNOSIS — M545 Low back pain: Secondary | ICD-10-CM

## 2016-08-23 ENCOUNTER — Encounter (HOSPITAL_COMMUNITY)
Admission: RE | Admit: 2016-08-23 | Discharge: 2016-08-23 | Disposition: A | Discharge status: Home / Self Care | Payer: Medicare Other | Source: Ambulatory Visit | Attending: Internal Medicine | Admitting: Internal Medicine

## 2016-08-23 DIAGNOSIS — C61 Malignant neoplasm of prostate: Secondary | ICD-10-CM | POA: Diagnosis present

## 2016-08-23 DIAGNOSIS — M545 Low back pain: Secondary | ICD-10-CM | POA: Diagnosis not present

## 2016-08-23 MED ORDER — TECHNETIUM TC 99M MEDRONATE IV KIT
25.0000 | PACK | Freq: Once | INTRAVENOUS | Status: AC | PRN
  Administered 2016-08-23: 25 via INTRAVENOUS

## 2016-08-24 ENCOUNTER — Ambulatory Visit (HOSPITAL_COMMUNITY): Payer: Medicare Other | Attending: Cardiovascular Disease

## 2016-08-24 ENCOUNTER — Other Ambulatory Visit: Payer: Self-pay

## 2016-08-24 DIAGNOSIS — R000 Tachycardia, unspecified: Secondary | ICD-10-CM | POA: Diagnosis not present

## 2016-08-24 DIAGNOSIS — I351 Nonrheumatic aortic (valve) insufficiency: Secondary | ICD-10-CM | POA: Insufficient documentation

## 2016-08-27 ENCOUNTER — Telehealth: Payer: Self-pay | Admitting: Interventional Cardiology

## 2016-08-27 NOTE — Telephone Encounter (Signed)
Walk In pt Form-BP Readings dropped off placed in [facility] doc box/KM

## 2016-08-28 ENCOUNTER — Telehealth: Payer: Self-pay | Admitting: Interventional Cardiology

## 2016-08-28 NOTE — Telephone Encounter (Signed)
°  Follow Up ° ° °Pt is returning call from yesterday. Please call. °

## 2016-08-29 ENCOUNTER — Telehealth: Payer: Self-pay | Admitting: Interventional Cardiology

## 2016-08-29 MED ORDER — METOPROLOL SUCCINATE ER 25 MG PO TB24: 25.0000 mg | ORAL_TABLET | Freq: Every day | ORAL | 3 refills | Status: DC

## 2016-08-29 NOTE — Telephone Encounter (Signed)
Spoke with pt and went over echo results and recommendations per Dr. Tamala Julian.  Pt verbalized understanding and was in agreement with this plan.  Scheduled pt to see Dr. Tamala Julian 10/12/16.

## 2016-08-29 NOTE — Telephone Encounter (Signed)
°  New Prob   Pt has some questions regarding metoprolol succinate (TOPROL XL) 25 MG 24 hr tablet. Requesting to speak to a nurse.

## 2016-08-29 NOTE — Telephone Encounter (Signed)
Follow up   Pt calling again for the [employer]. Please call

## 2016-08-29 NOTE — Telephone Encounter (Signed)
Follow up  ° ° ° °Pt is returning call to Jennifer. °

## 2016-08-29 NOTE — Telephone Encounter (Signed)
Spoke with pt and he was concerned about starting Metoprolol because of the side effects.  Spoke with pt about side effects and importance of taking the medication.  Pt concerned about anaphylactic reaction and being out of town.  Pt states that he will be in town the next couple of days.  Informed pt to go ahead and start the Metoprolol and to call if he has any kind of reaction.  Advised to go to ER if anaphylactic reaction.  Pt verbalized understanding and was in agreement with this plan.

## 2016-08-31 ENCOUNTER — Other Ambulatory Visit: Payer: Self-pay | Admitting: Internal Medicine

## 2016-08-31 DIAGNOSIS — R935 Abnormal findings on diagnostic imaging of other abdominal regions, including retroperitoneum: Secondary | ICD-10-CM

## 2016-08-31 DIAGNOSIS — S32020A Wedge compression fracture of second lumbar vertebra, initial encounter for closed fracture: Secondary | ICD-10-CM

## 2016-09-07 ENCOUNTER — Ambulatory Visit
Admission: RE | Admit: 2016-09-07 | Discharge: 2016-09-07 | Disposition: A | Discharge status: Home / Self Care | Payer: Medicare Other | Source: Ambulatory Visit | Attending: Internal Medicine | Admitting: Internal Medicine

## 2016-09-07 DIAGNOSIS — S32020A Wedge compression fracture of second lumbar vertebra, initial encounter for closed fracture: Secondary | ICD-10-CM

## 2016-09-10 ENCOUNTER — Other Ambulatory Visit (HOSPITAL_COMMUNITY): Payer: Self-pay | Admitting: Internal Medicine

## 2016-09-10 DIAGNOSIS — R935 Abnormal findings on diagnostic imaging of other abdominal regions, including retroperitoneum: Secondary | ICD-10-CM

## 2016-09-12 ENCOUNTER — Ambulatory Visit (HOSPITAL_COMMUNITY)
Admission: RE | Admit: 2016-09-12 | Discharge: 2016-09-12 | Disposition: A | Discharge status: Home / Self Care | Payer: Medicare Other | Source: Ambulatory Visit | Attending: Internal Medicine | Admitting: Internal Medicine

## 2016-09-12 DIAGNOSIS — Z9079 Acquired absence of other genital organ(s): Secondary | ICD-10-CM | POA: Insufficient documentation

## 2016-09-12 DIAGNOSIS — M881 Osteitis deformans of vertebrae: Secondary | ICD-10-CM | POA: Diagnosis not present

## 2016-09-12 DIAGNOSIS — K5730 Diverticulosis of large intestine without perforation or abscess without bleeding: Secondary | ICD-10-CM | POA: Insufficient documentation

## 2016-09-12 DIAGNOSIS — M8568 Other cyst of bone, other site: Secondary | ICD-10-CM | POA: Diagnosis not present

## 2016-09-12 DIAGNOSIS — R935 Abnormal findings on diagnostic imaging of other abdominal regions, including retroperitoneum: Secondary | ICD-10-CM

## 2016-09-12 MED ORDER — GADOBENATE DIMEGLUMINE 529 MG/ML IV SOLN
20.0000 mL | Freq: Once | INTRAVENOUS | Status: AC | PRN
  Administered 2016-09-12: 18 mL via INTRAVENOUS

## 2016-09-15 ENCOUNTER — Other Ambulatory Visit: Payer: Medicare Other

## 2016-09-24 ENCOUNTER — Other Ambulatory Visit: Payer: Self-pay | Admitting: Internal Medicine

## 2016-09-24 ENCOUNTER — Ambulatory Visit
Admission: RE | Admit: 2016-09-24 | Discharge: 2016-09-24 | Disposition: A | Discharge status: Home / Self Care | Payer: Medicare Other | Source: Ambulatory Visit | Attending: Internal Medicine | Admitting: Internal Medicine

## 2016-09-24 DIAGNOSIS — R29898 Other symptoms and signs involving the musculoskeletal system: Secondary | ICD-10-CM

## 2016-09-24 DIAGNOSIS — M545 Low back pain: Secondary | ICD-10-CM

## 2016-09-24 MED ORDER — GADOBENATE DIMEGLUMINE 529 MG/ML IV SOLN
15.0000 mL | Freq: Once | INTRAVENOUS | Status: AC | PRN
  Administered 2016-09-24: 15 mL via INTRAVENOUS

## 2016-09-28 ENCOUNTER — Inpatient Hospital Stay (HOSPITAL_COMMUNITY)
Admission: EM | Admit: 2016-09-28 | Discharge: 2016-10-03 | DRG: 095 | Disposition: A | Discharge status: Home / Self Care | Payer: Medicare Other | Attending: Internal Medicine | Admitting: Internal Medicine

## 2016-09-28 ENCOUNTER — Other Ambulatory Visit (HOSPITAL_COMMUNITY): Payer: Self-pay

## 2016-09-28 ENCOUNTER — Emergency Department (HOSPITAL_COMMUNITY): Payer: Medicare Other

## 2016-09-28 ENCOUNTER — Encounter (HOSPITAL_COMMUNITY): Payer: Self-pay | Admitting: Family Medicine

## 2016-09-28 DIAGNOSIS — E7800 Pure hypercholesterolemia, unspecified: Secondary | ICD-10-CM | POA: Diagnosis present

## 2016-09-28 DIAGNOSIS — E119 Type 2 diabetes mellitus without complications: Secondary | ICD-10-CM | POA: Diagnosis present

## 2016-09-28 DIAGNOSIS — Z801 Family history of malignant neoplasm of trachea, bronchus and lung: Secondary | ICD-10-CM

## 2016-09-28 DIAGNOSIS — E871 Hypo-osmolality and hyponatremia: Secondary | ICD-10-CM

## 2016-09-28 DIAGNOSIS — K5903 Drug induced constipation: Secondary | ICD-10-CM | POA: Diagnosis present

## 2016-09-28 DIAGNOSIS — G610 Guillain-Barre syndrome: Secondary | ICD-10-CM

## 2016-09-28 DIAGNOSIS — E785 Hyperlipidemia, unspecified: Secondary | ICD-10-CM | POA: Diagnosis present

## 2016-09-28 DIAGNOSIS — Z8546 Personal history of malignant neoplasm of prostate: Secondary | ICD-10-CM | POA: Diagnosis not present

## 2016-09-28 DIAGNOSIS — Z888 Allergy status to other drugs, medicaments and biological substances status: Secondary | ICD-10-CM

## 2016-09-28 DIAGNOSIS — K5909 Other constipation: Secondary | ICD-10-CM | POA: Diagnosis present

## 2016-09-28 DIAGNOSIS — R29898 Other symptoms and signs involving the musculoskeletal system: Secondary | ICD-10-CM | POA: Diagnosis not present

## 2016-09-28 DIAGNOSIS — I10 Essential (primary) hypertension: Secondary | ICD-10-CM | POA: Diagnosis present

## 2016-09-28 DIAGNOSIS — G373 Acute transverse myelitis in demyelinating disease of central nervous system: Secondary | ICD-10-CM

## 2016-09-28 DIAGNOSIS — M479 Spondylosis, unspecified: Secondary | ICD-10-CM | POA: Diagnosis present

## 2016-09-28 DIAGNOSIS — R251 Tremor, unspecified: Secondary | ICD-10-CM | POA: Diagnosis present

## 2016-09-28 DIAGNOSIS — R531 Weakness: Secondary | ICD-10-CM

## 2016-09-28 DIAGNOSIS — M069 Rheumatoid arthritis, unspecified: Secondary | ICD-10-CM | POA: Diagnosis present

## 2016-09-28 DIAGNOSIS — Z87891 Personal history of nicotine dependence: Secondary | ICD-10-CM

## 2016-09-28 DIAGNOSIS — M889 Osteitis deformans of unspecified bone: Secondary | ICD-10-CM | POA: Diagnosis present

## 2016-09-28 DIAGNOSIS — K5900 Constipation, unspecified: Secondary | ICD-10-CM | POA: Diagnosis not present

## 2016-09-28 HISTORY — DX: Constipation, unspecified: K59.00

## 2016-09-28 LAB — CBC
HEMATOCRIT: 48.8 % (ref 39.0–52.0)
HEMOGLOBIN: 16.9 g/dL (ref 13.0–17.0)
MCH: 32.3 pg (ref 26.0–34.0)
MCHC: 34.6 g/dL (ref 30.0–36.0)
MCV: 93.3 fL (ref 78.0–100.0)
Platelets: 451 10*3/uL — ABNORMAL HIGH (ref 150–400)
RBC: 5.23 MIL/uL (ref 4.22–5.81)
RDW: 13.7 % (ref 11.5–15.5)
WBC: 8.1 10*3/uL (ref 4.0–10.5)

## 2016-09-28 LAB — CSF CELL COUNT WITH DIFFERENTIAL
RBC Count, CSF: 2 /mm3 — ABNORMAL HIGH
Tube #: 3
WBC, CSF: 3 /mm3 (ref 0–5)

## 2016-09-28 LAB — BASIC METABOLIC PANEL WITH GFR
Anion gap: 8 (ref 5–15)
BUN: 23 mg/dL — ABNORMAL HIGH (ref 6–20)
CO2: 24 mmol/L (ref 22–32)
Calcium: 8.9 mg/dL (ref 8.9–10.3)
Chloride: 106 mmol/L (ref 101–111)
Creatinine, Ser: 0.82 mg/dL (ref 0.61–1.24)
GFR calc Af Amer: >60 mL/min
GFR calc non Af Amer: >60 mL/min
Glucose, Bld: 96 mg/dL (ref 65–99)
Potassium: 4.5 mmol/L (ref 3.5–5.1)
Sodium: 138 mmol/L (ref 135–145)

## 2016-09-28 LAB — GLUCOSE, CSF: Glucose, CSF: 55 mg/dL (ref 40–70)

## 2016-09-28 LAB — PROTEIN, CSF: Total  Protein, CSF: 215 mg/dL — ABNORMAL HIGH (ref 15–45)

## 2016-09-28 LAB — PROTIME-INR
INR: 0.94
Prothrombin Time: 12.5 s (ref 11.4–15.2)

## 2016-09-28 MED ORDER — HYDRALAZINE HCL 20 MG/ML IJ SOLN: 10.0000 mg | Freq: Three times a day (TID) | INTRAMUSCULAR | Status: DC | PRN

## 2016-09-28 MED ORDER — IMMUNE GLOBULIN (HUMAN) 5 GM/50ML IV SOLN
400.0000 mg/kg | INTRAVENOUS | Status: AC
  Administered 2016-09-28 – 2016-10-02 (×5): 35 g via INTRAVENOUS
  Filled 2016-09-28 (×5): qty 50

## 2016-09-28 MED ORDER — DOCUSATE SODIUM 100 MG PO CAPS
100.0000 mg | ORAL_CAPSULE | Freq: Two times a day (BID) | ORAL | Status: DC
  Administered 2016-09-28 – 2016-10-03 (×8): 100 mg via ORAL
  Filled 2016-09-28 (×8): qty 1

## 2016-09-28 MED ORDER — LIDOCAINE-EPINEPHRINE (PF) 2 %-1:200000 IJ SOLN
20.0000 mL | Freq: Once | INTRAMUSCULAR | Status: AC
  Administered 2016-09-28: 20 mL

## 2016-09-28 MED ORDER — METOPROLOL SUCCINATE ER 25 MG PO TB24
25.0000 mg | ORAL_TABLET | Freq: Every day | ORAL | Status: DC
  Administered 2016-09-29 – 2016-10-03 (×5): 25 mg via ORAL
  Filled 2016-09-28 (×5): qty 1

## 2016-09-28 MED ORDER — LIDOCAINE HCL (PF) 1 % IJ SOLN
INTRAMUSCULAR | Status: AC
  Filled 2016-09-28: qty 5

## 2016-09-28 MED ORDER — ONDANSETRON 4 MG PO TBDP: 4.0000 mg | ORAL_TABLET | Freq: Four times a day (QID) | ORAL | Status: DC | PRN

## 2016-09-28 MED ORDER — HEPARIN SODIUM (PORCINE) 5000 UNIT/ML IJ SOLN
5000.0000 [IU] | Freq: Three times a day (TID) | INTRAMUSCULAR | Status: DC
Start: 2016-09-28 — End: 2016-10-03
  Administered 2016-09-28 – 2016-10-02 (×11): 5000 [IU] via SUBCUTANEOUS
  Filled 2016-09-28 (×12): qty 1

## 2016-09-28 MED ORDER — LIDOCAINE HCL (PF) 1 % IJ SOLN
5.0000 mL | Freq: Once | INTRAMUSCULAR | Status: AC
  Administered 2016-09-28: 15 mL via INTRADERMAL

## 2016-09-28 MED ORDER — LIDOCAINE HCL (PF) 1 % IJ SOLN
INTRAMUSCULAR | Status: AC
  Filled 2016-09-28: qty 10

## 2016-09-28 MED ORDER — ACETAMINOPHEN 650 MG RE SUPP: 650.0000 mg | Freq: Four times a day (QID) | RECTAL | Status: DC | PRN

## 2016-09-28 MED ORDER — ACETAMINOPHEN 325 MG PO TABS
650.0000 mg | ORAL_TABLET | Freq: Four times a day (QID) | ORAL | Status: DC | PRN
  Administered 2016-09-28 – 2016-10-03 (×8): 650 mg via ORAL
  Filled 2016-09-28 (×8): qty 2

## 2016-09-28 MED ORDER — ACETAMINOPHEN 325 MG PO TABS
650.0000 mg | ORAL_TABLET | Freq: Four times a day (QID) | ORAL | Status: DC | PRN
  Administered 2016-09-28: 650 mg via ORAL
  Filled 2016-09-28: qty 2

## 2016-09-28 NOTE — ED Notes (Addendum)
Called lab to inquire whats needed from added labs .Marland Kitchen They are researching and will call back

## 2016-09-28 NOTE — ED Triage Notes (Signed)
Pt sent by MD Jola Baptist for concern for [name]. Pt reports weakness and loss of balance for 4 weeks, leg pain bilaterally for 6 weeks. Oxycodone for pain not effective.

## 2016-09-28 NOTE — ED Provider Notes (Signed)
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 On exam the patient doesn't fact have right greater than left lower extremity weakness but bilateral lower shotty weakness makes it difficult for him to walk. He has decreased if any absent reflexes at the patellar tendons. He has normal speech, he does have a mild tremor.  I was asked by the neurologist to perform the lumbar puncture for further clarification and diagnostic purposes, unfortunately was unsuccessful and he was sent for fluoroscopy. The patient will be admitted to the hospital for further evaluation.   Physical Exam  BP (!) 172/95   Pulse 77   Temp 97.6 F (36.4 C) (Oral)   Resp 17   SpO2 99%   Physical Exam  ED Course  .Lumbar Puncture Date/Time: 09/28/2016 11:10 AM Performed by: Noemi Chapel Authorized by: Noemi Chapel   Consent:    Consent obtained:  Written   Consent given by:  Patient   Risks discussed:  Bleeding, infection, pain, headache and repeat procedure   Alternatives discussed:  No treatment, delayed treatment and alternative treatment Pre-procedure details:    Procedure purpose:  Diagnostic   Preparation: Patient was prepped and draped in usual sterile fashion   Anesthesia (see MAR for exact dosages):    Anesthesia method:  Local infiltration   Local anesthetic:  Lidocaine 1% WITH epi Procedure details:    Lumbar space:  L3-L4 interspace   Patient position:  Sitting   Needle gauge:  20   Needle type:  Spinal needle - Quincke tip   Needle length (in):  3.5   Ultrasound guidance: no     Number of attempts:  2   Total volume (ml):  0 Post-procedure:    Puncture site:  Adhesive bandage applied and direct pressure applied   Patient tolerance of procedure:  Tolerated well, no immediate complications Comments:     No fluid obtained - sent for fluoroguided LP    Medical screening  examination/treatment/procedure(s) were conducted as a shared visit with non-physician practitioner(s) and myself.  I personally evaluated the patient during the encounter.  Clinical Impression:   Final diagnoses:  Weakness             Noemi Chapel, MD 09/28/16 1555

## 2016-09-28 NOTE — Progress Notes (Signed)
Eduardo Burch is a 76 y.o. male patient admitted from ED awake, alert - oriented  X 4 - no acute distress noted.  VSS - Blood pressure (!) 187/85, pulse 75, temperature 98.1 F (36.7 C), temperature source Oral, resp. rate 16, height 6' (1.829 m), weight 83.8 kg (184 lb 11.2 oz), SpO2 98 %.    IV in place, occlusive dsg intact without redness.  Orientation to room, and floor completed with information packet given to patient/family.  Patient declined safety video at this time.  Admission INP armband ID verified with patient/family, and in place.   SR up x 2, fall assessment complete, with patient and family able to verbalize understanding of risk associated with falls, and verbalized understanding to call nsg before up out of bed.  Call light within reach, patient able to voice, and demonstrate understanding.  Skin, clean-dry- intact without evidence of bruising, or skin tears.   No evidence of skin break down noted on exam.     Will cont to eval and treat per MD orders.  Luci Bank, RN 09/28/2016 4:56 PM

## 2016-09-28 NOTE — ED Notes (Signed)
Returned to RM 19 from IR. Pt laying flat. Mae x4 freely. Only complaint is hunger. Updated on poc and agrees

## 2016-09-28 NOTE — Consult Note (Addendum)
Neurology Consultation  Reason for Consult: progressive lower extremity tingling and numbness weakness and back pain Referring Physician: Dr. Sabra Heck  CC: progressive weakness in the legs, numbness of legs, back pain-ongoing for 6 weeks  History is obtained from:patient and his wife  HPI: Eduardo Burch is a 75 y.o. male who has a past medical history of hypertension hypercholesterolemia, ankylosing spondylitis who presented to the emergency room upon referral from his primary care physician for evaluation of progressive leg weakness. His problems started 6 weeks ago when he had severe low back pain. He went for evaluation of this and was given pain medication including opiates. Following this low back pain, he started developing some numbness of his buttocks followed by numbness of his outer buttocks and thigh followed bynumbness and weakness of his upper legs. He started noticing that he is having difficulty walking up the stairs because of leg weakness. The problems had been becoming progressively worse. His primary care hadan appointment set up for him to see a neurologistat [place] at some later time but his problems weren't resolving and were getting worse, hence he was recommended to come to the emergency room. Another reason why he was asked to come to the ER was that he felt that for the last few days to week, his voice had been becoming raspy. He denies any preceding illnesses prior to this low backache. No stomach infections. No diarrhea. No upper respiratory infections. Denies any fevers chills shortness of breath nausea vomiting. Denies any bruisability bleeding clotting. Denies any endocrine problems.  Also reports that he has some tremor in his hands and his neck. This has been ongoing for past couple of years. Upon asking if the tremor has any correlation or improvement or worsening with alcohol intake, he did say that he has noticed that if he drinks a glass of wine or alcohol  his tremor gets better. He has in the past been told that this might be benign essential tremor at some point but has been on no treatment for this. He brought tup because of concern for parkinsonism because he had a tremor.Denies any resting pill-rolling type tremor or changes in handwriting.denies any other neurological complaints  ROS: A 14 point ROS was performed and is negative except as noted in the HPI.  Past Medical History:  Diagnosis Date  . Elevated blood pressure reading   . 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 Hypercholesterolemia   . Hypertension   . Spondylosis      Family History  Problem Relation Age of Onset  . Lung cancer Mother   . Spondylitis Father    Social History:   reports that he has quit smoking. His smoking use included Cigarettes. He has never used smokeless tobacco. His alcohol and drug histories are not on file.   Current Facility-Administered Medications:  .  lidocaine (PF) (XYLOCAINE) 1 % injection 5 mL, 5 mL, Intradermal, Once, Noemi Chapel, MD .  lidocaine (PF) (XYLOCAINE) 1 % injection, , , ,   Current Outpatient Prescriptions:  .  acetaminophen (TYLENOL) 500 MG tablet, Take 500-1,000 mg by mouth every 6 (six) hours as needed for headache., Disp: , Rfl:  .  docusate sodium (COLACE) 100 MG capsule, Take 1 capsule (100 mg total) by mouth every 12 (twelve) hours., Disp: 60 capsule, Rfl: 0 .  Glucosamine-Chondroit-Vit C-Mn (GLUCOSAMINE 1500 COMPLEX) CAPS, Take 1 capsule by mouth daily. , Disp: , Rfl:  .  metoprolol succinate (TOPROL  XL) 25 MG 24 hr tablet, Take 1 tablet (25 mg total) by mouth daily., Disp: 90 tablet, Rfl: 3 .  Multiple Vitamins-Minerals (ONE-A-DAY MENS 50+ ADVANTAGE) TABS, Take 1 tablet by mouth daily., Disp: , Rfl:  .  naproxen (NAPROSYN) 500 MG tablet, Take 125 mg by mouth daily as needed (for swollen joint pain). , Disp: , Rfl:  .  ondansetron (ZOFRAN ODT) 4 MG disintegrating tablet, Take 1 tablet (4 mg  total) by mouth every 6 (six) hours as needed for nausea or vomiting., Disp: 20 tablet, Rfl: 0 .  oxyCODONE-acetaminophen (PERCOCET/ROXICET) 5-325 MG tablet, Take 1 tablet by mouth every 6 (six) hours as needed for pain., Disp: , Rfl: 0 .  simvastatin (ZOCOR) 40 MG tablet, Take 40 mg by mouth daily., Disp: , Rfl:    Exam: Current vital signs: BP (!) 179/85 (BP Location: Right Arm)   Pulse 75   Temp 97.6 F (36.4 C) (Oral)   Resp 17   SpO2 100%  Vital signs in last 24 hours: Temp:  [97.6 F (36.4 C)] 97.6 F (36.4 C) (07/27 0901) Pulse Rate:  [75-100] 75 (07/27 0901) Resp:  [17-18] 17 (07/27 0901) BP: (124-179)/(79-85) 179/85 (07/27 0901) SpO2:  [95 %-100 %] 100 % (07/27 0916)  GENERAL: Awake, alert in NAD HEENT: - Normocephalic and atraumatic, dry mm, no LN++, no Thyromegally LUNGS - Clear to auscultation bilaterally with no wheezes CV - S1S2 RRR, no m/r/g, equal pulses bilaterally. ABDOMEN - Soft, nontender, nondistended with normoactive BS Ext: warm, well perfused, intact peripheral pulses, __ edema  NEURO:  Mental Status: AA&Ox3  Language: speech is ___clear__.  Naming, repetition, fluency, and comprehension intact. Cranial Nerves: PERRL__3__mm/brisk. EOMI, visual fields full, no facial asymmetry, facial sensation intact, hearing intact, tongue/uvula/soft palate midline, normal sternocleidomastoid and trapezius muscle strength. No evidence of tongue atrophy or fibrillations Motor: symmetric 4/5 strength in both lower extremities, 5/5 strength in the upper extremities.. On outstretched arms there is noticeable coarse action tremor. Slightly worse on the left than on the right. Tone: is normal and bulk is normal. No cogwheeling Sensation- markedly decreased sensation to light touch, vibration and proprioception in the lower extremities-worse on the right. Intact sensation of the upper extremities bilaterally Coordination: FTN intact bilaterally, no ataxia in BLE Deep tendon  reflexes could not be elicited in the upper or lower extremities. Gait- deferred  Labs CBC    Component Value Date/Time   WBC 8.1 09/28/2016 0620   RBC 5.23 09/28/2016 0620   HGB 16.9 09/28/2016 0620   HCT 48.8 09/28/2016 0620   PLT 451 (H) 09/28/2016 0620   MCV 93.3 09/28/2016 0620   MCH 32.3 09/28/2016 0620   MCHC 34.6 09/28/2016 0620   RDW 13.7 09/28/2016 0620   LYMPHSABS 2.4 08/11/2016 0301   MONOABS 1.0 08/11/2016 0301   EOSABS 0.2 08/11/2016 0301   BASOSABS 0.0 08/11/2016 0301    CMP     Component Value Date/Time   NA 138 09/28/2016 0620   NA 139 08/16/2016 1106   K 4.5 09/28/2016 0620   CL 106 09/28/2016 0620   CO2 24 09/28/2016 0620   GLUCOSE 96 09/28/2016 0620   BUN 23 (H) 09/28/2016 0620   BUN 20 08/16/2016 1106   CREATININE 0.82 09/28/2016 0620   CALCIUM 8.9 09/28/2016 0620   PROT 7.4 08/16/2016 1106   ALBUMIN 4.6 08/16/2016 1106   AST 32 08/16/2016 1106   ALT 40 08/16/2016 1106   ALKPHOS 148 (H) 08/16/2016 1106   BILITOT  0.7 08/16/2016 1106   GFRNONAA >60 09/28/2016 0620   GFRAA >60 09/28/2016 0620   CMP     Component Value Date/Time   NA 138 09/28/2016 0620   NA 139 08/16/2016 1106   K 4.5 09/28/2016 0620   CL 106 09/28/2016 0620   CO2 24 09/28/2016 0620   GLUCOSE 96 09/28/2016 0620   BUN 23 (H) 09/28/2016 0620   BUN 20 08/16/2016 1106   CREATININE 0.82 09/28/2016 0620   CALCIUM 8.9 09/28/2016 0620   PROT 7.4 08/16/2016 1106   ALBUMIN 4.6 08/16/2016 1106   AST 32 08/16/2016 1106   ALT 40 08/16/2016 1106   ALKPHOS 148 (H) 08/16/2016 1106   BILITOT 0.7 08/16/2016 1106   GFRNONAA >60 09/28/2016 0620   GFRAA >60 09/28/2016 0086   Imaging - I have reviewed the images obtained. 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 Lumbar spine spondylosis. noncontrast CT of the head IMPRESSION: Negative for acute hemorrhage. Mild atrophy with asymmetric enlargement of the  left lateral ventricle temporal horn. Left temporal horn enlargement is of uncertain etiology.  Outside labs done recently showed a ESR and CPK. Mild LFT derangement, otherwise normal CMP  Assessment: 76 year old ma presenting for evaluation of progressive leg weakness along with paresthesias of the lower extremities that started about 6 weeks ago preceded by an accompanied by moderate low back pain,who on exam has no cranial nerve deficits, has symmetr bilateral lower extremity weakness, sensory loss on the right lower extremity worse than left lower extremity and areflexia. Top of the differential list would be Guillain-Barr syndrome- AIDP (acute inflammatory demyelinating polyneuropathy) or AMSAN (acute motor and sensory axonal neuropathy).  Given lack of cranial neuropathies and prolonged course - could also  Be CIDP (chronic inflammatory demyelinating polyneuropathy)  Impression: Evaluate for GBS/CIDP/AMSAN Less likely myopathy.  Recommendations: Obtain a spinal tap. Check for protein, cells, CSFGram stain and culture. Discussed the plan with the emergency room doctors. Once the results are available, if there is albumin cytological dissociation, would recommend admission and treatment with 5 days of IVIG. Would recommend checking urinalysis as well. Will eventually need outpatient follow-up with neurology and EMG nerve conduction studies at that point. We will follow   Amie Portland, MD [facility] [phone]  If 7pm to 7am, please call on call as listed on AMION.   ADDENDUM CSF results reviewed. Prt 215, cells 1. Clear Albumino-cytological dissociation.  Admit for IVIG administration. 2g/kg IVIG divided in 5 daily doses. Check NIFs and FVCs q4h. Check antiGM1 antibodies. Will order. CSF Igm IGGG and IgG index. Oligoclonal bands.

## 2016-09-28 NOTE — H&P (Signed)
[facility] History and Physical  Pius Byrom VEL:[id number] DOB: 04-22-1940 DOA: 09/28/2016  Referring physician:  PCP: Lavone Orn, MD   Chief Complaint: "I do started to wobble."  HPI: Eduardo Burch is a 76 y.o. male  with past medical history of prostate cancers diabetes prostatectomy, hypertension, spondylosis presents emergency room with chief complaint of lower extremity weakness. Patient was sent over by his primary care doctor. Patient has had progressively worsening lower extremity weakness over the last 6 weeks. No trauma prior to this. Denies any recent illness prior to this. No past experiences similar to this. Patient liveed a healthy and active lifestyle prior to the last 6 weeks with accompanying weakness. No new medications.  She had about patient's MRI done on the 23rd that was negative.  Review of Systems:  As per HPI otherwise 10 point review of systems negative.    Past Medical History:  Diagnosis Date  . Elevated blood pressure reading   . 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 Hypercholesterolemia   . Hypertension   . Spondylosis    Past Surgical History:  Procedure Laterality Date  . PROATATECTOMY     Social History:  reports that he has quit smoking. His smoking use included Cigarettes. He has never used smokeless tobacco. His alcohol and drug histories are not on file.  Allergies  Allergen Reactions  . Remicade [Infliximab] Anaphylaxis    Family History  Problem Relation Age of Onset  . Lung cancer Mother   . Spondylitis Father      Prior to Admission medications   Medication Sig Start Date End Date Taking? Authorizing Provider  acetaminophen (TYLENOL) 500 MG tablet Take 500-1,000 mg by mouth every 6 (six) hours as needed for headache.   Yes [provider]  docusate sodium (COLACE) 100 MG capsule Take 1 capsule (100 mg total) by mouth every 12 (twelve) hours. 08/11/16  Yes Ward, Delice Bison, DO    Glucosamine-Chondroit-Vit C-Mn (GLUCOSAMINE 1500 COMPLEX) CAPS Take 1 capsule by mouth daily.    Yes [provider]  metoprolol succinate (TOPROL XL) 25 MG 24 hr tablet Take 1 tablet (25 mg total) by mouth daily. 08/29/16  Yes Belva Crome, MD  Multiple Vitamins-Minerals (ONE-A-DAY MENS 50+ ADVANTAGE) TABS Take 1 tablet by mouth daily.   Yes [provider]  naproxen (NAPROSYN) 500 MG tablet Take 125 mg by mouth daily as needed (for swollen joint pain).  03/07/16  Yes [provider]  ondansetron (ZOFRAN ODT) 4 MG disintegrating tablet Take 1 tablet (4 mg total) by mouth every 6 (six) hours as needed for nausea or vomiting. 08/11/16  Yes Ward, Delice Bison, DO  oxyCODONE-acetaminophen (PERCOCET/ROXICET) 5-325 MG tablet Take 1 tablet by mouth every 6 (six) hours as needed for pain. 09/24/16  Yes [provider]  simvastatin (ZOCOR) 40 MG tablet Take 40 mg by mouth daily. 01/09/16  Yes [provider]   Physical Exam: Vitals:   09/28/16 1500 09/28/16 1634 09/28/16 1635 09/28/16 1659  BP: (!) 172/86  (!) 187/85 (!) 178/88  Pulse: 77  75   Resp: (!) 22  16   Temp:   98.1 F (36.7 C)   TempSrc:   Oral   SpO2: 98%  98%   Weight:  83.8 kg (184 lb 11.2 oz)    Height:  6' (1.829 m)      Wt Readings from Last 3 Encounters:  09/28/16 83.8 kg (184 lb 11.2 oz)  08/16/16 89.4 kg (197 lb 1.9 oz)  08/11/16 90.3 kg (199 lb)    General:  Appears calm and comfortable; alert and oriented 3 Eyes:  PERRL, EOMI, normal lids, iris ENT:  grossly normal hearing, lips & tongue Neck:  no LAD, masses or thyromegaly Cardiovascular:  RRR, no m/r/g. No LE edema.  Respiratory:  CTA bilaterally, no w/r/r. Normal respiratory effort. Abdomen:  soft, ntnd Skin:  no rash or induration seen on limited exam Musculoskeletal:  grossly normal tone BUE/BLE, right lower extremity weaker than left, left lower extremity has worsened sedation from the knee to the tip of  toes Psychiatric:  grossly normal mood and affect, speech fluent and appropriate Neurologic:  CN 2-12 grossly intact, moves all extremities in coordinated fashion.          Labs on Admission:  Basic Metabolic Panel:  Recent Labs Lab 09/28/16 0620  NA 138  K 4.5  CL 106  CO2 24  GLUCOSE 96  BUN 23*  CREATININE 0.82  CALCIUM 8.9   Liver Function Tests: No results for input(s): AST, ALT, ALKPHOS, BILITOT, PROT, ALBUMIN in the last 168 hours. No results for input(s): LIPASE, AMYLASE in the last 168 hours. No results for input(s): AMMONIA in the last 168 hours. CBC:  Recent Labs Lab 09/28/16 0620  WBC 8.1  HGB 16.9  HCT 48.8  MCV 93.3  PLT 451*   Cardiac Enzymes: No results for input(s): CKTOTAL, CKMB, CKMBINDEX, TROPONINI in the last 168 hours.  BNP (last 3 results) No results for input(s): BNP in the last 8760 hours.  ProBNP (last 3 results) No results for input(s): PROBNP in the last 8760 hours.   Serum creatinine: 0.82 mg/dL 09/28/16 0620 Estimated creatinine clearance: 82.8 mL/min  CBG: No results for input(s): GLUCAP in the last 168 hours.  Radiological Exams on Admission: Ct Head Wo Contrast  Result Date: 09/28/2016 CLINICAL DATA:  Lower extremity weakness. Concern for Guillain-Barre. EXAM: CT HEAD WITHOUT CONTRAST TECHNIQUE: Contiguous axial images were obtained from the base of the skull through the vertex without intravenous contrast. COMPARISON:  None. FINDINGS: Brain: No evidence for acute hemorrhage, mass lesion, midline shift or large infarct. Evidence for mild atrophy with some low density in the white matter suggesting chronic changes. Left temporal horn of the left lateral ventricle is asymmetrically enlarged compared to the right side. Vascular: No hyperdense vessel or unexpected calcification. Skull: Normal. Negative for fracture or focal lesion. Sinuses/Orbits: Mucosal disease with bone thickening in the right maxillary sinus. Findings are  suggestive for paranasal sinus disease. Other: None. IMPRESSION: Negative for acute hemorrhage. Mild atrophy with asymmetric enlargement of the left lateral ventricle temporal horn. Left temporal horn enlargement is of uncertain etiology. Electronically Signed   By: Markus Daft M.D.   On: 09/28/2016 11:27   Dg Lumbar Puncture Fluoro Guide  Result Date: 09/28/2016 CLINICAL DATA:  progressive leg weakness, concern for guillain barre EXAM: DIAGNOSTIC LUMBAR PUNCTURE UNDER FLUOROSCOPIC GUIDANCE FLUOROSCOPY TIME:  Fluoroscopy Time:  3  Min  42  Sec Radiation Exposure Index (if provided by the fluoroscopic device): na Number of Acquired Spot Images: 3 PROCEDURE: Informed consent was obtained from the patient prior to the procedure, including potential complications of headache, allergy, and pain. With the patient prone, the lower back was prepped with Betadine. 1% Lidocaine was used for local anesthesia. Lumbar puncture was performed at the L3-4 level using a 22 gauge needle with return of clear CSF with an opening pressure of 16 cm water.  Eight ml of CSF were obtained for laboratory studies. The patient tolerated the procedure well and there were no apparent complications. IMPRESSION: Technically successful lumbar puncture Electronically Signed   By: Kerby Moors M.D.   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

## 2016-09-28 NOTE — Progress Notes (Signed)
LP shows albuminocytologic dissociation. IVIG 0.4 g qd x 5 days has been ordered. Discussed with Hospitalist service.   Electronically signed: Dr. Kerney Elbe

## 2016-09-28 NOTE — ED Notes (Signed)
Transported to IR for lumbar puncture

## 2016-09-28 NOTE — ED Notes (Signed)
Dr Sabra Heck attempted LP at bedside without success. Pt tolerated well and understands the next step is IR for procedure.

## 2016-09-28 NOTE — ED Provider Notes (Signed)
[facility] DEPT Provider Note   CSN: [id number] Arrival date & time: 09/28/16  0549     History   Chief Complaint Chief Complaint  Patient presents with  . Weakness    sent by MD for possible Eduardo Burch     HPI Eduardo Burch is a 76 y.o. male.  The history is provided by the patient and medical records.  Weakness    76 year old male with history of hypertension, history of prostate cancer, hyperlipidemia, hypertension, ankylosing spondylitis, rheumatoid arthritis, presenting to the ED for weakness. Patient reports over the last 4-6 weeks he has had progressively worsening weakness of his legs.  States initially it was just catching his balance every now and then, but now he has significant impairments when trying to walk. Reports he has difficulty moving his legs one in front of another and has to consciously think about taking his next steps. States his balance is severely impaired and he has to catch himself with every step. He has not had significant falls.  He denies any weakness of the arms. He does report his voice has been sounding somewhat raspy lately and he has had trouble eating and swallowing pills as he feels that things get "caught" in his throat. He denies any trouble swallowing, no drooling. He denies any feelings of throat swelling or closing. He denies any chest pain or shortness of breath. He has not noticed any weakness in his arms. He denies any preceding illness to his leg weakness. No tick bites.  Not currently on any immune suppressants for his rheumatoid arthritis. He has not had any recent fever or chills. Patient has already had MRI of his lumbar spine and hip without known explanation for his leg weakness.  No bowel or bladder incontinence.  Past Medical History:  Diagnosis Date  . Elevated blood pressure reading   . 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 Hypercholesterolemia   . Hypertension   . Spondylosis     Patient Active  Problem List   Diagnosis Date Noted  . Tachycardia 08/16/2016  . Hypertension, essential 08/15/2016    Past Surgical History:  Procedure Laterality Date  . PROATATECTOMY         Home Medications    Prior to Admission medications   Medication Sig Start Date End Date Taking? Authorizing Provider  acetaminophen (TYLENOL) 500 MG tablet Take 500-1,000 mg by mouth every 6 (six) hours as needed for headache.    [provider]  docusate sodium (COLACE) 100 MG capsule Take 1 capsule (100 mg total) by mouth every 12 (twelve) hours. 08/11/16   Ward, Delice Bison, DO  Glucosamine-Chondroit-Vit C-Mn (GLUCOSAMINE 1500 COMPLEX) CAPS Take 1 capsule by mouth daily.     [provider]  metoprolol succinate (TOPROL XL) 25 MG 24 hr tablet Take 1 tablet (25 mg total) by mouth daily. 08/29/16   Belva Crome, MD  Multiple Vitamins-Minerals (ONE-A-DAY MENS 50+ ADVANTAGE) TABS Take 1 tablet by mouth daily.    [provider]  naproxen (NAPROSYN) 500 MG tablet Take 125 mg by mouth daily as needed (for swollen joint pain).  03/07/16   [provider]  ondansetron (ZOFRAN ODT) 4 MG disintegrating tablet Take 1 tablet (4 mg total) by mouth every 6 (six) hours as needed for nausea or vomiting. 08/11/16   Ward, Delice Bison, DO  simvastatin (ZOCOR) 40 MG tablet Take 40 mg by mouth daily. 01/09/16   [provider]  Family History Family History  Problem Relation Age of Onset  . Lung cancer Mother   . Spondylitis Father     Social History Social History  Substance Use Topics  . Smoking status: Former Smoker    Types: Cigarettes  . Smokeless tobacco: Never Used  . Alcohol use Not on file     Allergies   Remicade [infliximab]   Review of Systems Review of Systems  Neurological: Positive for weakness.  All other systems reviewed and are negative.    Physical Exam Updated Vital Signs BP 124/79 (BP Location: Left Arm)   Pulse 100   Temp 97.6 F (36.4 C)  (Oral)   Resp 18   SpO2 95%   Physical Exam  Constitutional: He is oriented to person, place, and time. He appears well-developed and well-nourished.  HENT:  Head: Normocephalic and atraumatic.  Mouth/Throat: Oropharynx is clear and moist.  Voice is raspy, no swelling or edema noted, no oral lesions  Eyes: Pupils are equal, round, and reactive to light. Conjunctivae and EOM are normal.  Neck: Normal range of motion.  Cardiovascular: Normal rate, regular rhythm and normal heart sounds.   Pulmonary/Chest: Effort normal and breath sounds normal. No respiratory distress. He has no wheezes.  Abdominal: Soft. Bowel sounds are normal. There is no tenderness. There is no rebound.  Musculoskeletal: Normal range of motion.  Neurological: He is alert and oriented to person, place, and time.  AAOx3, weakness of the right leg compared with left, tremors noted when trying to lift it off the bed; arms appear to have normal strength but does have resting tremors of both arms as well as neck (reports present prior to onset of weakness); reflexes are hyporeflexive in the legs; appear normal in the arms; speech is clear and goal oriented; no facial droop  Skin: Skin is warm and dry.  Psychiatric: He has a normal mood and affect.  Nursing note and vitals reviewed.    ED Treatments / Results  Labs (all labs ordered are listed, but only abnormal results are displayed) Labs Reviewed  CBC - Abnormal; Notable for the following:       Result Value   Platelets 451 (*)    All other components within normal limits  BASIC METABOLIC PANEL - Abnormal; Notable for the following:    BUN 23 (*)    All other components within normal limits  PROTEIN, CSF - Abnormal; Notable for the following:    Total  Protein, CSF 215 (*)    All other components within normal limits  CSF CELL COUNT WITH DIFFERENTIAL - Abnormal; Notable for the following:    RBC Count, CSF 2 (*)    All other components within normal limits  CSF  CULTURE  ANAEROBIC CULTURE  FUNGUS CULTURE WITH STAIN  PROTIME-INR  GLUCOSE, CSF  HERPES SIMPLEX VIRUS(HSV) DNA BY PCR  CBC  CREATININE, SERUM  IGG CSF INDEX    EKG  EKG Interpretation None       Radiology No results found.  Procedures Procedures (including critical care time)  Medications Ordered in ED Medications - No data to display   Initial Impression / Assessment and Plan / ED Course  I have reviewed the triage vital signs and the nursing notes.  Pertinent labs & imaging results that were available during my care of the patient were reviewed by me and considered in my medical decision making (see chart for details).  76 year old male here with progressive bilateral lower extremity leg weakness over  the past several weeks. He has had an MRI of his hip and spine which did not reveal any acute etiology. He denies any recent illness or tick bites. Has a history of ankylosing spondylitis as well as rheumatoid arthritis, not currently on any immunomodulators.  PCP referred here with concern for Guillain Barr.  On exam patient does have weakness of the legs, more pronounced on the right. His reflexes are severely diminished. He does have some tremors of the hands as well as the neck and raspy voice. His physical exam findings are clinically concerning for Guillain Barr. Screening labs were sent from triage, overall reassuring.  Will discuss with neurology.  Case discussed with neurology who has evaluated patient. Agrees that Eduardo Burch most likely diagnosis. LP was attempted here in ED but unsuccessful. This was done under fluoroscopy. CSF protein elevated at 215, again concerning for [facility].  Patient will likely need to be started on IVIG, Dr. Rory Percy aware of results and will come down and reassess patient, further others to follow.  Patient has been admitted to medicine service for ongoing care.  Final Clinical Impressions(s) / ED Diagnoses   Final diagnoses:    Weakness    New Prescriptions New Prescriptions   No medications on file     Larene Pickett, PA-C 09/28/16 1514    Noemi Chapel, MD 09/28/16 [phone]

## 2016-09-29 DIAGNOSIS — K5900 Constipation, unspecified: Secondary | ICD-10-CM

## 2016-09-29 DIAGNOSIS — I10 Essential (primary) hypertension: Secondary | ICD-10-CM

## 2016-09-29 LAB — BASIC METABOLIC PANEL
Anion gap: 6 (ref 5–15)
BUN: 19 mg/dL (ref 6–20)
CHLORIDE: 104 mmol/L (ref 101–111)
CO2: 25 mmol/L (ref 22–32)
CREATININE: 0.66 mg/dL (ref 0.61–1.24)
Calcium: 8.3 mg/dL — ABNORMAL LOW (ref 8.9–10.3)
GFR calc Af Amer: >60 mL/min (ref >60)
GFR calc non Af Amer: >60 mL/min (ref >60)
Glucose, Bld: 97 mg/dL (ref 65–99)
POTASSIUM: 4.1 mmol/L (ref 3.5–5.1)
SODIUM: 135 mmol/L (ref 135–145)

## 2016-09-29 LAB — CBC
HEMATOCRIT: 47.9 % (ref 39.0–52.0)
Hemoglobin: 15.9 g/dL (ref 13.0–17.0)
MCH: 31.4 pg (ref 26.0–34.0)
MCHC: 33.2 g/dL (ref 30.0–36.0)
MCV: 94.5 fL (ref 78.0–100.0)
PLATELETS: 380 10*3/uL (ref 150–400)
RBC: 5.07 MIL/uL (ref 4.22–5.81)
RDW: 13.8 % (ref 11.5–15.5)
WBC: 8.7 10*3/uL (ref 4.0–10.5)

## 2016-09-29 MED ORDER — ASPIRIN 81 MG PO CHEW
81.0000 mg | CHEWABLE_TABLET | Freq: Every day | ORAL | Status: DC
  Administered 2016-09-29 – 2016-10-03 (×5): 81 mg via ORAL
  Filled 2016-09-29 (×5): qty 1

## 2016-09-29 NOTE — Progress Notes (Signed)
PROGRESS NOTE    Eduardo Burch  WYO:[id number] DOB: [birth year] DOA: 09/28/2016 PCP: Lavone Orn, MD   Brief Narrative: Eduardo Burch is a 76 y.o. male with a history of prostate cancer status post prostatectomy abdominal hypertension, spondylosis. 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 Neurology on board and have started IVIG.   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 CSF culture with no growth over last 24 hours. -Neurology recommendations: IVIG for 5 days; every 4 hour NIF and vital capacity; aspirin started in the setting of IVIG -Pending labs: All legal clonal bands, IgG CSF index, fungal culture CSF, aerobic culture CSF, HSV PCR CSF  Essential hypertension Blood pressure improved from yesterday. -Continue metoprolol  Constipation -Enema -Mag citrate   DVT prophylaxis: Heparin Code Status: Full code Family Communication: None at bedside Disposition Plan: Discharge in 5 days s/p IVIG and neurology clearance   Consultants:   Neurology  Procedures:   IVIG (7/27>>7/31  Antimicrobials:  None    Subjective: Patient reports no noticeable weakness. Urge to have a bowel movement.  Objective: Vitals:   09/29/16 0207 09/29/16 0435 09/29/16 0534 09/29/16 0827  BP: (!) 186/95  (!) 166/75 (!) 108/91  Pulse: 90  88 98  Resp:   17   Temp: 98.1 F (36.7 C)  98.2 F (36.8 C)   TempSrc: Oral  Oral   SpO2: 99%  99%   Weight:  80.6 kg (177 lb 9.6 oz)    Height:        Intake/Output Summary (Last 24 hours) at 09/29/16 1028 Last data filed at 09/29/16 7741  Gross per 24 hour  Intake              320 ml  Output                0 ml  Net              320 ml   Filed Weights   09/28/16 1634 09/29/16 0435  Weight: 83.8 kg (184 lb 11.2 oz) 80.6 kg (177 lb 9.6 oz)    Examination:  General exam: Appears calm  and comfortable Respiratory system: Clear to auscultation. Respiratory effort normal. Cardiovascular system: S1 & S2 heard, RRR. No murmurs, rubs, gallops or clicks. Gastrointestinal system: Abdomen is nondistended, soft and nontender. No organomegaly or masses felt. Normal bowel sounds heard. Central nervous system: Alert and oriented. 4-5 strength in bilateral lower extremities. 5 out of 5 strength in bilateral upper extremities. Extremities: No edema. No calf tenderness Skin: No cyanosis. No rashes Psychiatry: Judgement and insight appear normal. Mood & affect appropriate.     Data Reviewed: I have personally reviewed following labs and imaging studies  CBC:  Recent Labs Lab 09/28/16 0620 09/29/16 0436  WBC 8.1 8.7  HGB 16.9 15.9  HCT 48.8 47.9  MCV 93.3 94.5  PLT 451* 287   Basic Metabolic Panel:  Recent Labs Lab 09/28/16 0620 09/29/16 0400  NA 138 135  K 4.5 4.1  CL 106 104  CO2 24 25  GLUCOSE 96 97  BUN 23* 19  CREATININE 0.82 0.66  CALCIUM 8.9 8.3*   GFR: Estimated Creatinine Clearance: 84.9 mL/min (by C-G formula based on SCr of 0.66 mg/dL). Liver Function Tests: No results for input(s): AST, ALT, ALKPHOS, BILITOT, PROT, ALBUMIN in the last 168 hours. No results for input(s):  LIPASE, AMYLASE in the last 168 hours. No results for input(s): AMMONIA in the last 168 hours. Coagulation Profile:  Recent Labs Lab 09/28/16 1009  INR 0.94   Cardiac Enzymes: No results for input(s): CKTOTAL, CKMB, CKMBINDEX, TROPONINI in the last 168 hours. BNP (last 3 results) No results for input(s): PROBNP in the last 8760 hours. HbA1C: No results for input(s): HGBA1C in the last 72 hours. CBG: No results for input(s): GLUCAP in the last 168 hours. Lipid Profile: No results for input(s): CHOL, HDL, LDLCALC, TRIG, CHOLHDL, LDLDIRECT in the last 72 hours. Thyroid Function Tests: No results for input(s): TSH, T4TOTAL, FREET4, T3FREE, THYROIDAB in the last 72  hours. Anemia Panel: No results for input(s): VITAMINB12, FOLATE, FERRITIN, TIBC, IRON, RETICCTPCT in the last 72 hours. Sepsis Labs: No results for input(s): PROCALCITON, LATICACIDVEN in the last 168 hours.  Recent Results (from the past 240 hour(s))  CSF culture     Status: None (Preliminary result)   Collection Time: 09/28/16 12:50 PM  Result Value Ref Range Status   Specimen Description CSF  Final   Special Requests NONE  Final   Gram Stain   Final    WBC PRESENT, PREDOMINANTLY MONONUCLEAR NO ORGANISMS SEEN CYTOSPIN SMEAR    Culture NO GROWTH < 24 HOURS  Final   Report Status PENDING  Incomplete         Radiology Studies: Ct Head Wo Contrast  Result Date: 09/28/2016 CLINICAL DATA:  Lower extremity weakness. Concern for Guillain-Barre. EXAM: CT HEAD WITHOUT CONTRAST TECHNIQUE: Contiguous axial images were obtained from the base of the skull through the vertex without intravenous contrast. COMPARISON:  None. FINDINGS: Brain: No evidence for acute hemorrhage, mass lesion, midline shift or large infarct. Evidence for mild atrophy with some low density in the white matter suggesting chronic changes. Left temporal horn of the left lateral ventricle is asymmetrically enlarged compared to the right side. Vascular: No hyperdense vessel or unexpected calcification. Skull: Normal. Negative for fracture or focal lesion. Sinuses/Orbits: Mucosal disease with bone thickening in the right maxillary sinus. Findings are suggestive for paranasal sinus disease. Other: None. IMPRESSION: Negative for acute hemorrhage. Mild atrophy with asymmetric enlargement of the left lateral ventricle temporal horn. Left temporal horn enlargement is of uncertain etiology. Electronically Signed   By: Markus Daft M.D.   On: 09/28/2016 11:27   Dg Lumbar Puncture Fluoro Guide  Result Date: 09/28/2016 CLINICAL DATA:  progressive leg weakness, concern for guillain barre EXAM: DIAGNOSTIC LUMBAR PUNCTURE UNDER FLUOROSCOPIC  GUIDANCE FLUOROSCOPY TIME:  Fluoroscopy Time:  3  Min  42  Sec Radiation Exposure Index (if provided by the fluoroscopic device): na Number of Acquired Spot Images: 3 PROCEDURE: Informed consent was obtained from the patient prior to the procedure, including potential complications of headache, allergy, and pain. With the patient prone, the lower back was prepped with Betadine. 1% Lidocaine was used for local anesthesia. Lumbar puncture was performed at the L3-4 level using a 22 gauge needle with return of clear CSF with an opening pressure of 16 cm water. Eight ml of CSF were obtained for laboratory studies. The patient tolerated the procedure well and there were no apparent complications. IMPRESSION: Technically successful lumbar puncture Electronically Signed   By: Kerby Moors M.D.   On: 09/28/2016 13:28        Scheduled Meds: . aspirin  81 mg Oral Daily  . docusate sodium  100 mg Oral Q12H  . heparin  5,000 Units Subcutaneous Q8H  . Immune Globulin  10%  400 mg/kg Intravenous Q24 Hr x 5  . metoprolol succinate  25 mg Oral Daily   Continuous Infusions:   LOS: 1 day     Cordelia Poche, MD [facility] 09/29/2016, 10:28 AM Pager: [phone]  If 7PM-7AM, please contact night-coverage [email or web address] Password [facility] 09/29/2016, 10:28 AM

## 2016-09-29 NOTE — Progress Notes (Signed)
*  Late entry* Pt achieved >-40 on NIF with excellent effort. VC was unavailable at time of procedure. Pt stating he has a headache and wants to try and sleep tonight. RT will check back to see if pt is awake for scheduled NIF/VC. RT will continue to monitor.

## 2016-09-29 NOTE — Progress Notes (Signed)
Subjective: Mr. Fredin is laying comfortably in bed on my arrival. Wife at bedside. Complains only of constipation pain.  Current Pertinent Medications: IVIG 400mg /kg daily x 5 days - on day 2 Aspirin 81mg  daily  Pertinent Labs/Diagnostics: CSF: Protein 215; WBC: 3   Physical Examination: Vitals:   09/29/16 0534 09/29/16 0827  BP: (!) 166/75 (!) 108/91  Pulse: 88 98  Resp: 17   Temp: 98.2 F (36.8 C)     General: WDWN male.  HEENT:  Normocephalic, no lesions, without obvious abnormality.  Normal external eye and conjunctiva.  Normal external ears. Normal external nose, mucus membranes and septum.  Normal pharynx. Cardiovascular: S1, S2 normal, pulses palpable throughout   Pulmonary: chest clear, unlabored Abdomen: Soft, non-tender but uncomfortable Extremities: no joint deformities, effusion, or inflammation Musculoskeletal: no joint tenderness, deformity or swelling Skin: warm and dry, no hyperpigmentation, vitiligo, or suspicious lesions  Neurological Examination:  CN: Pupils are equal and round and symmetrically reactive. EOMI without nystagmus. Facial sensation is intact to light touch. Face is symmetric at rest with normal strength and mobility. Hearing is intact to conversational voice. Palate elevates symmetrically and uvula is midline. Voice is normal in tone, pitch and quality. Bilateral SCM and trapezii are 5/5. Tongue is midline with normal bulk and mobility.  Motor: Normal bulk, tone, and strength. 5/5 BUE; 4/5 throughout BLE. No drift. Tone and bulk: normal throughout; no atrophy noted. Course action tremor remains on the BUE. Sensation: Diminished to light touch in the BLE. DTRs: Absent throughout  Coordination: Finger-to-nose and heel-to-shin are without ataxia   Assessment: Pleasant 76 year old male with is six week course of worsening bilateral leg weakness that began with lumbago. Recent LP showed albuminocytologic dissociation. Lack of cranial nerve involvement  with the long course of disease favors a GB variant such as AMSAN, AIDP or CIDP. He needs outpatient EMG/NCS to confirm.  Recommendations: 1) Continue IVIG x 5 days 2) Continue 81mg  aspirin daily 3) Constipation treatment PRN 4) We will continue to follow  Solon Augusta PA-C [facility] [phone]  09/29/2016, 9:41 AM  NEUROHOSPITALIST ADDENDUM Pt. Seen and examined. Got first dose IVIG overngiht. Will complete 5D IVIG. His PCP Dr. Laurann Montana was updated via phone and will set up outpatient f/u with a neurologist, preferably neuromuscular specialist in the area. Spoke and discussed with the patient and his wife in detail and discussed the plan. Also communicated the plan to Dr. Teryl Lucy.  -- Amie Portland, MD [facility] [phone]  If 7pm to 7am, please call on call as listed on AMION.

## 2016-09-29 NOTE — Progress Notes (Signed)
nif and vc performed per MD order NIf greater than -40 cm H20  VC 0.65L with great effort.

## 2016-09-29 NOTE — Evaluation (Signed)
Physical Therapy Evaluation Patient Details Name: Eduardo Burch MRN: [id number] DOB: February 10, 1941 Today's Date: 09/29/2016   History of Present Illness  Eduardo Burch is a 76 y.o. male with a history of prostate cancer status post prostatectomy abdominal hypertension, spondylosis. 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 Neurology on board and have started IVIG.  Clinical Impression  Patient presents with problems listed below.  Will benefit from acute PT to maximize functional independence prior to discharge.  Patient on day 2 of 5 days of IVIG.  Will continue to assess patient as changes occur during course of treatment.  At this point, patient with decreased LE strength, impacting balance and gait.  Recommend OP PT for f/u therapy for mobility, strengthening, and balance.    Follow Up Recommendations Outpatient PT;Supervision for mobility/OOB (Will continue to assess for f/u needs as pt progresses)    Equipment Recommendations  Kasandra Knudsen (Will continue to assess)    Recommendations for Other Services       Precautions / Restrictions Precautions Precautions: Fall Restrictions Weight Bearing Restrictions: No      Mobility  Bed Mobility Overal bed mobility: Modified Independent             General bed mobility comments: Increased time.  Patient assists RLE onto bed to return to supine.  Transfers Overall transfer level: Modified independent Equipment used: None Transfers: Sit to/from Stand Sit to Stand: Modified independent (Device/Increase time)            Ambulation/Gait Ambulation/Gait assistance: Min guard Ambulation Distance (Feet): 48 Feet Assistive device: None Gait Pattern/deviations: Step-through pattern;Decreased stride length;Shuffle Gait velocity: decreased Gait velocity interpretation: Below normal speed for age/gender General Gait Details: Patient with slow steady gait on level surfaces.  Assist for  safety/balance.  Stairs            Wheelchair Mobility    Modified Rankin (Stroke Patients Only)       Balance Overall balance assessment: Needs assistance Sitting-balance support: No upper extremity supported;Feet supported Sitting balance-Leahy Scale: Good     Standing balance support: No upper extremity supported Standing balance-Leahy Scale: Good Standing balance comment: Good with gait on level surfaces.   Single Leg Stance - Right Leg: 0 Single Leg Stance - Left Leg: 1     Rhomberg - Eyes Opened: 23 Rhomberg - Eyes Closed: 0                 Pertinent Vitals/Pain Pain Assessment: No/denies pain    Home Living Family/patient expects to be discharged to:: Private residence Living Arrangements: Spouse/significant other Available Help at Discharge: Family;Available 24 hours/day Type of Home: House Home Access: Stairs to enter Entrance Stairs-Rails: Psychiatric nurse of Steps: 5 Home Layout: Two level;Able to live on main level with bedroom/bathroom Home Equipment: Shower seat      Prior Function Level of Independence: Independent         Comments: Patient works part-time at his company (desk job).     Hand Dominance   Dominant Hand: Right    Extremity/Trunk Assessment   Upper Extremity Assessment Upper Extremity Assessment: Overall WFL for tasks assessed    Lower Extremity Assessment Lower Extremity Assessment: RLE deficits/detail;LLE deficits/detail RLE Deficits / Details: Hip flex 3-/5, knee extension 3/5, DF 4-/5 RLE Coordination: decreased gross motor LLE Deficits / Details: Hip flex 3/5, knee extension 4-/5, DF 4/5 LLE Coordination: decreased gross motor    Cervical / Trunk Assessment Cervical / Trunk Assessment: Normal  Communication  Communication: HOH  Cognition Arousal/Alertness: Awake/alert Behavior During Therapy: WFL for tasks assessed/performed Overall Cognitive Status: Within Functional Limits for  tasks assessed                                        General Comments      Exercises     Assessment/Plan    PT Assessment Patient needs continued PT services  PT Problem List Decreased strength;Decreased activity tolerance;Decreased balance;Decreased mobility;Decreased coordination;Decreased knowledge of use of DME       PT Treatment Interventions DME instruction;Gait training;Stair training;Functional mobility training;Therapeutic activities;Therapeutic exercise;Balance training;Patient/family education    PT Goals (Current goals can be found in the Care Plan section)  Acute Rehab PT Goals Patient Stated Goal: To get stronger PT Goal Formulation: With patient Time For Goal Achievement: 10/06/16 Potential to Achieve Goals: Good    Frequency Min 3X/week   Barriers to discharge        Co-evaluation               AM-PAC PT "6 Clicks" Daily Activity  Outcome Measure Difficulty turning over in bed (including adjusting bedclothes, sheets and blankets)?: None Difficulty moving from lying on back to sitting on the side of the bed? : A Little Difficulty sitting down on and standing up from a chair with arms (e.g., wheelchair, bedside commode, etc,.)?: None Help needed moving to and from a bed to chair (including a wheelchair)?: A Little Help needed walking in hospital room?: A Little Help needed climbing 3-5 steps with a railing? : A Little 6 Click Score: 20    End of Session   Activity Tolerance: Patient limited by fatigue Patient left: in bed;with call bell/phone within reach Nurse Communication: Mobility status PT Visit Diagnosis: Unsteadiness on feet (R26.81);Other abnormalities of gait and mobility (R26.89);Muscle weakness (generalized) (M62.81)    Time: 7425-9563 PT Time Calculation (min) (ACUTE ONLY): 16 min   Charges:   PT Evaluation $PT Eval Moderate Complexity: 1 Procedure     PT G Codes:        Carita Pian. Sanjuana Kava, [facility] Acute  Rehab Services Pager [place] 09/29/2016, 9:42 PM

## 2016-09-30 ENCOUNTER — Encounter (HOSPITAL_COMMUNITY): Payer: Self-pay

## 2016-09-30 MED ORDER — TRAMADOL HCL 50 MG PO TABS
50.0000 mg | ORAL_TABLET | Freq: Four times a day (QID) | ORAL | Status: DC | PRN
  Administered 2016-09-30 – 2016-10-03 (×8): 50 mg via ORAL
  Filled 2016-09-30 (×8): qty 1

## 2016-09-30 MED ORDER — LISINOPRIL 5 MG PO TABS
5.0000 mg | ORAL_TABLET | Freq: Every day | ORAL | Status: DC
  Administered 2016-10-01 – 2016-10-02 (×2): 5 mg via ORAL
  Filled 2016-09-30 (×2): qty 1

## 2016-09-30 MED ORDER — TRAMADOL HCL 50 MG PO TABS
50.0000 mg | ORAL_TABLET | Freq: Two times a day (BID) | ORAL | Status: DC
  Administered 2016-09-30: 50 mg via ORAL
  Filled 2016-09-30: qty 1

## 2016-09-30 NOTE — Progress Notes (Signed)
Physical Therapy Treatment Patient Details Name: Eduardo Burch MRN: [id number] DOB: 09/02/1940 Today's Date: 09/30/2016    History of Present Illness Eduardo Burch is a 76 y.o. male with a history of prostate cancer status post prostatectomy abdominal hypertension, spondylosis. 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 Neurology on board and have started IVIG.    PT Comments    Patient continues to be unsteady with gait during longer distances due to weakness/fatigue. Required UE support for high level balance activities - tandem walking, sidestepping, braiding.  Patient able to perform LE exercises in sitting and standing with 1 sitting rest break.   Continue to recommend OP PT at d/c for balance training.   Follow Up Recommendations  Outpatient PT;Supervision for mobility/OOB     Equipment Recommendations  Kasandra Knudsen (Will continue to assess need)    Recommendations for Other Services       Precautions / Restrictions Precautions Precautions: Fall Restrictions Weight Bearing Restrictions: No    Mobility  Bed Mobility Overal bed mobility: Modified Independent             General bed mobility comments: Increased time  Transfers Overall transfer level: Modified independent Equipment used: None Transfers: Sit to/from Stand Sit to Stand: Modified independent (Device/Increase time)            Ambulation/Gait Ambulation/Gait assistance: Min guard Ambulation Distance (Feet): 200 Feet (200' x 2 with sitting rest break) Assistive device: None Gait Pattern/deviations: Step-through pattern;Decreased stride length;Drifts right/left     General Gait Details: Patient with steady gait initially.  After approx 80', patient begins to ambulate more quickly, using momentum to help with balance, but unsafe.  Encouraged patient to ambulate in more controlled manner.   Stairs            Wheelchair Mobility    Modified Rankin (Stroke  Patients Only)       Balance             Standing balance-Leahy Scale: Good Standing balance comment: Good with gait on level surfaces and short distances.       Tandem Stance - Right Leg: 12 Tandem Stance - Left Leg: 14     High level balance activites: Side stepping;Braiding;Direction changes;Turns;Head turns High Level Balance Comments: Patient requiring UE support to perform sidestepping and braiding.  With head turns, patient loses balance in both directions - holds onto rail for UE support.            Cognition Arousal/Alertness: Awake/alert Behavior During Therapy: WFL for tasks assessed/performed Overall Cognitive Status: Within Functional Limits for tasks assessed                                        Exercises Total Joint Exercises Hip ABduction/ADduction: AROM;Strengthening;Both;20 reps;Standing Long Arc Quad: AROM;Strengthening;Both;20 reps;Seated Standing Hip Extension: AROM;Strengthening;Both;20 reps;Standing General Exercises - Lower Extremity Hip Flexion/Marching: AROM;Strengthening;Both;20 reps;Seated Toe Raises: AROM;Strengthening;Both;20 reps;Standing Heel Raises: AROM;Strengthening;Both;20 reps;Standing Mini-Sqauts: AROM;Strengthening;Both;20 reps;Standing Other Exercises Other Exercises: Mini-squats with Lt foot forward; AROM; Strengthening; Right; 20 reps; Standing Other Exercises: Mini-squats with Rt foot forward; AROM; Strengthening; Left; 20 reps; Standing    General Comments General comments (skin integrity, edema, etc.): HR increased from 90 bpm to 112 bpm during exercises/gait.  SaO2 remained at 97% during session.      Pertinent Vitals/Pain Pain Assessment: No/denies pain    Home Living  Prior Function            PT Goals (current goals can now be found in the care plan section) Acute Rehab PT Goals Patient Stated Goal: To get stronger Progress towards PT goals: Progressing toward  goals    Frequency    Min 3X/week      PT Plan Current plan remains appropriate    Co-evaluation              AM-PAC PT "6 Clicks" Daily Activity  Outcome Measure  Difficulty turning over in bed (including adjusting bedclothes, sheets and blankets)?: None Difficulty moving from lying on back to sitting on the side of the bed? : None Difficulty sitting down on and standing up from a chair with arms (e.g., wheelchair, bedside commode, etc,.)?: None Help needed moving to and from a bed to chair (including a wheelchair)?: A Little Help needed walking in hospital room?: A Little Help needed climbing 3-5 steps with a railing? : A Little 6 Click Score: 21    End of Session Equipment Utilized During Treatment: Gait belt Activity Tolerance: Patient tolerated treatment well;Patient limited by fatigue Patient left: in bed;with call bell/phone within reach Nurse Communication: Mobility status PT Visit Diagnosis: Unsteadiness on feet (R26.81);Other abnormalities of gait and mobility (R26.89);Muscle weakness (generalized) (M62.81)     Time: 5400-8676 PT Time Calculation (min) (ACUTE ONLY): 32 min  Charges:  $Gait Training: 8-22 mins $Therapeutic Exercise: 8-22 mins                    G Codes:       Carita Pian. Sanjuana Kava, [facility] Acute Rehab Services Pager [place] 09/30/2016, 1:15 PM

## 2016-09-30 NOTE — Progress Notes (Addendum)
Neurology progress note  Subjective: Patient seen and examined. Complains of back pain, similar to what he had when he was constipated. Got enema yesterday and had a bowel movement but feels constipated again.  He brought out some concerns which were addressed with the charge nurse this morning: #The IV team overnight for his steroid infusion seems to be unclear about either the dose with a rate of administration. He seemed to be upset and anxious about that fact. Have addressed this with the chart Was going to address this with the appropriate teams. #He also would like to get active physical therapy quality remains in-house. This was also related to the charge nurse and will be communicated to the PT team.  Objective: Vitals:   09/30/16 0631 09/30/16 0834  BP: (!) 166/90 (!) 164/88  Pulse: 88 88  Resp: 18   Temp: (!) 97.5 F (36.4 C)    General: WDWN male.  HEENT:  Normocephalic, no lesions, without obvious abnormality.  Normal external eye and conjunctiva.  Normal external ears. Normal external nose, mucus membranes and septum.  Normal pharynx. Cardiovascular: S1, S2 normal, pulses palpable throughout   Pulmonary: chest clear, unlabored Abdomen: Soft, non-tender but uncomfortable Extremities: no joint deformities, effusion, or inflammation Musculoskeletal: no joint tenderness, deformity or swelling Skin: warm and dry, no hyperpigmentation, vitiligo, or suspicious lesions Neurological Examination: OB:SJGGEZ are equal and round and symmetrically reactive. EOMI without nystagmus. Facial sensation is intact to light touch. Face is symmetric at rest with normal strength and mobility. Hearing is intact to conversational voice. Palate elevates symmetrically and uvula is midline. Voice is normal in tone, pitch and quality. Bilateral SCM and trapezii are 5/5. Tongue is midline with normal bulk and mobility.  Motor:Normal bulk, tone, and strength. 5/5 BUE; 4/5 throughout BLE With the right  lower extremity slightly weaker than the left.No drift. Tone and bulk: normal throughout; no atrophy noted. Course action tremor remains on the BUE. Sensation: Diminished to light touch in the BLE. DTRs:Absent throughout, toes downgoing Coordination: Finger-to-nose and heel-to-shin are without ataxia  CBC    Component Value Date/Time   WBC 8.7 09/29/2016 0436   RBC 5.07 09/29/2016 0436   HGB 15.9 09/29/2016 0436   HCT 47.9 09/29/2016 0436   PLT 380 09/29/2016 0436   MCV 94.5 09/29/2016 0436   MCH 31.4 09/29/2016 0436   MCHC 33.2 09/29/2016 0436   RDW 13.8 09/29/2016 0436   LYMPHSABS 2.4 08/11/2016 0301   MONOABS 1.0 08/11/2016 0301   EOSABS 0.2 08/11/2016 0301   BASOSABS 0.0 08/11/2016 0301   CMP     Component Value Date/Time   NA 135 09/29/2016 0400   NA 139 08/16/2016 1106   K 4.1 09/29/2016 0400   CL 104 09/29/2016 0400   CO2 25 09/29/2016 0400   GLUCOSE 97 09/29/2016 0400   BUN 19 09/29/2016 0400   BUN 20 08/16/2016 1106   CREATININE 0.66 09/29/2016 0400   CALCIUM 8.3 (L) 09/29/2016 0400   PROT 7.4 08/16/2016 1106   ALBUMIN 4.6 08/16/2016 1106   AST 32 08/16/2016 1106   ALT 40 08/16/2016 1106   ALKPHOS 148 (H) 08/16/2016 1106   BILITOT 0.7 08/16/2016 1106   GFRNONAA >60 09/29/2016 0400   GFRAA >60 09/29/2016 0400   MRI lumbar spine done recently shows no enhancement of the nerve roots. Resp. Metrics: NIF and VC obtained per order.  NIF was >-40 VC was 3.65 L with great effort.  Assessment  76 year old man with nearly six-week course  of worsening bilateral leg weakness following back pain. Exam consistent with asymmetric bil;ateral LE weakness, areflexia and mild sensory loss in the LE. No cranial nerve impairment. Spinal tap confirmed albumin cytological dissociation. Symptoms most consistent with Guillain-Barr syndrome. Will need further workup as outpatient to figure out if this is one of the other variants such as acute motor sensory or  CIDP.  Recommendations -Continue IVIG for a total of 5 days. Did discuss that he might need PLEX if symptoms do not improve at all or might need additional IVIG. May also need IVIG as outpatient. -Aspirin 81 mg daily -Continued monitoring of NIF and FVCs -Treat constipation when necessary per primary team. -I ordered for him Tramadol 50mg  PO 1 for back pain. We'll try to avoid using opiates for pain but his pain level was high and he was not very responsive to Tylenol overnight and was in a lot of discomfort, hence this dose. Can continue tylenol PRN going forward. -Aggressive PT OT -All his questions and his wife's questions were answered. -Will need follow-up with neuromuscular specialist as outpatient. They are interested in following up at [facility]. His PCP Dr. Laurann Montana will refer to the outpatient neurologist.  Neurology will continue to follow. Please call with questions.  Amie Portland, MD [facility] [phone]  If 7pm to 7am, please call on call as listed on AMION.

## 2016-09-30 NOTE — Progress Notes (Signed)
NIF and VC obtained per order.  NIF was >-40 VC was 3.65 L with great effort.

## 2016-09-30 NOTE — Progress Notes (Signed)
NIF >-40 VC 3.9L

## 2016-09-30 NOTE — Progress Notes (Signed)
PROGRESS NOTE    [name]  GUR:[id number] DOB: Nov 14, 1940 DOA: 09/28/2016 PCP: Lavone Orn, MD   Brief Narrative: Eduardo Burch is a 76 y.o. male with a history of prostate cancer status post prostatectomy abdominal hypertension, spondylosis. 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 Neurology on board and have started IVIG.   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 CSF culture with no growth over last 24 hours. -Neurology recommendations: IVIG for 5 days; every 4 hour NIF and vital capacity; aspirin started in the setting of IVIG -Pending labs: oligoclonal bands, IgG CSF index, fungal culture CSF, aerobic culture CSF, HSV PCR CSF  Essential hypertension Blood pressure improved from yesterday. -Continue metoprolol -start lisinopril  Constipation Resolved with enema.   DVT prophylaxis: Heparin Code Status: Full code Family Communication: None at bedside Disposition Plan: Discharge in 4 days s/p IVIG and neurology clearance   Consultants:   Neurology  Procedures:   IVIG (7/27>>7/31  Antimicrobials:  None    Subjective: Strength improved in lower extremities  Objective: Vitals:   09/30/16 0320 09/30/16 0500 09/30/16 0631 09/30/16 0834  BP: (!) 169/90  (!) 166/90 (!) 164/88  Pulse: 87  88 88  Resp: 18  18   Temp: 98.3 F (36.8 C)  (!) 97.5 F (36.4 C)   TempSrc:   Oral   SpO2: 98%  99%   Weight:  80.7 kg (177 lb 14.4 oz)    Height:       No intake or output data in the 24 hours ending 09/30/16 1055 Filed Weights   09/28/16 1634 09/29/16 0435 09/30/16 0500  Weight: 83.8 kg (184 lb 11.2 oz) 80.6 kg (177 lb 9.6 oz) 80.7 kg (177 lb 14.4 oz)    Examination:  General exam: Appears calm and comfortable Respiratory system: Clear to auscultation. Respiratory effort normal. Cardiovascular system:  Regular rate and rhythm. Normal S1 and S2. No heart murmurs present. No extra heart sounds Gastrointestinal system: Soft, non-tender, non-distended, no guarding, no rebound, no masses felt Central nervous system: Alert and oriented. 4/5 strength in bilateral lower extremities. 5 out of 5 strength in bilateral upper extremities. Extremities: No edema. No calf tenderness Skin: No cyanosis. No rashes Psychiatry: Judgement and insight appear normal. Mood & affect appropriate.     Data Reviewed: I have personally reviewed following labs and imaging studies  CBC:  Recent Labs Lab 09/28/16 0620 09/29/16 0436  WBC 8.1 8.7  HGB 16.9 15.9  HCT 48.8 47.9  MCV 93.3 94.5  PLT 451* 283   Basic Metabolic Panel:  Recent Labs Lab 09/28/16 0620 09/29/16 0400  NA 138 135  K 4.5 4.1  CL 106 104  CO2 24 25  GLUCOSE 96 97  BUN 23* 19  CREATININE 0.82 0.66  CALCIUM 8.9 8.3*   GFR: Estimated Creatinine Clearance: 84.9 mL/min (by C-G formula based on SCr of 0.66 mg/dL). Liver Function Tests: No results for input(s): AST, ALT, ALKPHOS, BILITOT, PROT, ALBUMIN in the last 168 hours. No results for input(s): LIPASE, AMYLASE in the last 168 hours. No results for input(s): AMMONIA in the last 168 hours. Coagulation Profile:  Recent Labs Lab 09/28/16 1009  INR 0.94   Cardiac Enzymes: No results for input(s): CKTOTAL, CKMB, CKMBINDEX, TROPONINI in the last 168 hours. BNP (last 3 results) No results for input(s): PROBNP in the last 8760 hours. HbA1C:  No results for input(s): HGBA1C in the last 72 hours. CBG: No results for input(s): GLUCAP in the last 168 hours. Lipid Profile: No results for input(s): CHOL, HDL, LDLCALC, TRIG, CHOLHDL, LDLDIRECT in the last 72 hours. Thyroid Function Tests: No results for input(s): TSH, T4TOTAL, FREET4, T3FREE, THYROIDAB in the last 72 hours. Anemia Panel: No results for input(s): VITAMINB12, FOLATE, FERRITIN, TIBC, IRON, RETICCTPCT in the last 72  hours. Sepsis Labs: No results for input(s): PROCALCITON, LATICACIDVEN in the last 168 hours.  Recent Results (from the past 240 hour(s))  CSF culture     Status: None (Preliminary result)   Collection Time: 09/28/16 12:50 PM  Result Value Ref Range Status   Specimen Description CSF  Final   Special Requests NONE  Final   Gram Stain   Final    WBC PRESENT, PREDOMINANTLY MONONUCLEAR NO ORGANISMS SEEN CYTOSPIN SMEAR    Culture NO GROWTH 2 DAYS  Final   Report Status PENDING  Incomplete  Anaerobic culture     Status: None (Preliminary result)   Collection Time: 09/28/16 12:50 PM  Result Value Ref Range Status   Specimen Description CSF  Final   Special Requests NONE  Final   Culture   Final    NO ANAEROBES ISOLATED; CULTURE IN PROGRESS FOR 5 DAYS   Report Status PENDING  Incomplete         Radiology Studies: Ct Head Wo Contrast  Result Date: 09/28/2016 CLINICAL DATA:  Lower extremity weakness. Concern for Guillain-Barre. EXAM: CT HEAD WITHOUT CONTRAST TECHNIQUE: Contiguous axial images were obtained from the base of the skull through the vertex without intravenous contrast. COMPARISON:  None. FINDINGS: Brain: No evidence for acute hemorrhage, mass lesion, midline shift or large infarct. Evidence for mild atrophy with some low density in the white matter suggesting chronic changes. Left temporal horn of the left lateral ventricle is asymmetrically enlarged compared to the right side. Vascular: No hyperdense vessel or unexpected calcification. Skull: Normal. Negative for fracture or focal lesion. Sinuses/Orbits: Mucosal disease with bone thickening in the right maxillary sinus. Findings are suggestive for paranasal sinus disease. Other: None. IMPRESSION: Negative for acute hemorrhage. Mild atrophy with asymmetric enlargement of the left lateral ventricle temporal horn. Left temporal horn enlargement is of uncertain etiology. Electronically Signed   By: Markus Daft M.D.   On: 09/28/2016  11:27   Dg Lumbar Puncture Fluoro Guide  Result Date: 09/28/2016 CLINICAL DATA:  progressive leg weakness, concern for guillain barre EXAM: DIAGNOSTIC LUMBAR PUNCTURE UNDER FLUOROSCOPIC GUIDANCE FLUOROSCOPY TIME:  Fluoroscopy Time:  3  Min  42  Sec Radiation Exposure Index (if provided by the fluoroscopic device): na Number of Acquired Spot Images: 3 PROCEDURE: Informed consent was obtained from the patient prior to the procedure, including potential complications of headache, allergy, and pain. With the patient prone, the lower back was prepped with Betadine. 1% Lidocaine was used for local anesthesia. Lumbar puncture was performed at the L3-4 level using a 22 gauge needle with return of clear CSF with an opening pressure of 16 cm water. Eight ml of CSF were obtained for laboratory studies. The patient tolerated the procedure well and there were no apparent complications. IMPRESSION: Technically successful lumbar puncture Electronically Signed   By: Kerby Moors M.D.   On: 09/28/2016 13:28        Scheduled Meds: . aspirin  81 mg Oral Daily  . docusate sodium  100 mg Oral Q12H  . heparin  5,000 Units Subcutaneous Q8H  .  Immune Globulin 10%  400 mg/kg Intravenous Q24 Hr x 5  . metoprolol succinate  25 mg Oral Daily   Continuous Infusions:   LOS: 2 days     Cordelia Poche, MD [facility] 09/30/2016, 10:55 AM Pager: [phone]  If 7PM-7AM, please contact night-coverage [email or web address] Password [facility] 09/30/2016, 10:55 AM

## 2016-09-30 NOTE — Progress Notes (Signed)
NIF > -40 VC  3.75 L

## 2016-10-01 ENCOUNTER — Encounter (HOSPITAL_COMMUNITY): Payer: Self-pay

## 2016-10-01 LAB — IGG CSF INDEX
ALBUMIN CSF-MCNC: 181 mg/dL — AB (ref 11–48)
ALBUMIN: 3.5 g/dL (ref 3.5–4.8)
CSF IgG Index: 0.7 (ref 0.0–0.7)
IGG (IMMUNOGLOBIN G), SERUM: 706 mg/dL (ref 700–1600)
IGG CSF: 26.6 mg/dL — AB (ref 0.0–8.6)
IGG/ALB RATIO, CSF: 0.15 (ref 0.00–0.25)

## 2016-10-01 LAB — CSF CULTURE: CULTURE: NO GROWTH

## 2016-10-01 LAB — MISC LABCORP TEST (SEND OUT): Labcorp test code: 9985

## 2016-10-01 LAB — HERPES SIMPLEX VIRUS(HSV) DNA BY PCR
HSV 1 DNA: NEGATIVE
HSV 2 DNA: NEGATIVE

## 2016-10-01 NOTE — Progress Notes (Signed)
VC 3.5 and NIF > -40 patient had excellent effort

## 2016-10-01 NOTE — Progress Notes (Signed)
PROGRESS NOTE    Eduardo Burch  KGM:[id number] DOB: 10/18/1940 DOA: 09/28/2016 PCP: Lavone Orn, MD   Brief Narrative: Eduardo Burch is a 76 y.o. male with a history of prostate cancer status post prostatectomy abdominal hypertension, spondylosis. 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 Neurology on board and have started IVIG.   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 CSF culture with no growth over last 24 hours. -Neurology recommendations: IVIG for 5 days; NIF and vital capacity; aspirin started in the setting of IVIG -Pending labs: oligoclonal bands, IgG CSF index, fungal culture CSF, aerobic culture CSF, HSV PCR CSF -Physical therapy: Outpatient PT on discharge  Essential hypertension Blood pressure improved from yesterday. -Continue metoprolol -continue lisinopril  Constipation Resolved with enema.   DVT prophylaxis: Heparin Code Status: Full code Family Communication: None at bedside Disposition Plan: Discharge in 2 days s/p IVIG and neurology clearance   Consultants:   Neurology  Procedures:   IVIG (7/27>>7/31  Antimicrobials:  None    Subjective: Walking with PT. Weakness persistent from yesterday.  Objective: Vitals:   10/01/16 0007 10/01/16 0040 10/01/16 0616 10/01/16 0915  BP: (!) 169/90 (!) 162/85 (!) 159/94   Pulse: 81 85 85 94  Resp: 20 18 17    Temp: 98.2 F (36.8 C) 98.5 F (36.9 C) 97.6 F (36.4 C)   TempSrc: Oral Oral Oral   SpO2: 98% 98% 99%   Weight:   80.5 kg (177 lb 8 oz)   Height:        Intake/Output Summary (Last 24 hours) at 10/01/16 1003 Last data filed at 10/01/16 0515  Gross per 24 hour  Intake                0 ml  Output               70 ml  Net              -70 ml   Filed Weights   09/29/16 0435 09/30/16 0500 10/01/16 0616  Weight: 80.6 kg (177 lb 9.6  oz) 80.7 kg (177 lb 14.4 oz) 80.5 kg (177 lb 8 oz)    Examination:  General exam: Appears calm and comfortable Respiratory system: Respiratory effort normal. Central nervous system: Alert and oriented. Skin: No cyanosis. No rashes Psychiatry: Judgement and insight appear normal. Mood & affect appropriate.     Data Reviewed: I have personally reviewed following labs and imaging studies  CBC:  Recent Labs Lab 09/28/16 0620 09/29/16 0436  WBC 8.1 8.7  HGB 16.9 15.9  HCT 48.8 47.9  MCV 93.3 94.5  PLT 451* 644   Basic Metabolic Panel:  Recent Labs Lab 09/28/16 0620 09/29/16 0400  NA 138 135  K 4.5 4.1  CL 106 104  CO2 24 25  GLUCOSE 96 97  BUN 23* 19  CREATININE 0.82 0.66  CALCIUM 8.9 8.3*   GFR: Estimated Creatinine Clearance: 84.9 mL/min (by C-G formula based on SCr of 0.66 mg/dL). Liver Function Tests: No results for input(s): AST, ALT, ALKPHOS, BILITOT, PROT, ALBUMIN in the last 168 hours. No results for input(s): LIPASE, AMYLASE in the last 168 hours. No results for input(s): AMMONIA in the last 168 hours. Coagulation Profile:  Recent Labs Lab 09/28/16 1009  INR 0.94   Cardiac Enzymes: No results for input(s): CKTOTAL, CKMB, CKMBINDEX, TROPONINI in the last  168 hours. BNP (last 3 results) No results for input(s): PROBNP in the last 8760 hours. HbA1C: No results for input(s): HGBA1C in the last 72 hours. CBG: No results for input(s): GLUCAP in the last 168 hours. Lipid Profile: No results for input(s): CHOL, HDL, LDLCALC, TRIG, CHOLHDL, LDLDIRECT in the last 72 hours. Thyroid Function Tests: No results for input(s): TSH, T4TOTAL, FREET4, T3FREE, THYROIDAB in the last 72 hours. Anemia Panel: No results for input(s): VITAMINB12, FOLATE, FERRITIN, TIBC, IRON, RETICCTPCT in the last 72 hours. Sepsis Labs: No results for input(s): PROCALCITON, LATICACIDVEN in the last 168 hours.  Recent Results (from the past 240 hour(s))  CSF culture     Status:  None (Preliminary result)   Collection Time: 09/28/16 12:50 PM  Result Value Ref Range Status   Specimen Description CSF  Final   Special Requests NONE  Final   Gram Stain   Final    WBC PRESENT, PREDOMINANTLY MONONUCLEAR NO ORGANISMS SEEN CYTOSPIN SMEAR    Culture NO GROWTH 2 DAYS  Final   Report Status PENDING  Incomplete  Anaerobic culture     Status: None (Preliminary result)   Collection Time: 09/28/16 12:50 PM  Result Value Ref Range Status   Specimen Description CSF  Final   Special Requests NONE  Final   Culture   Final    NO ANAEROBES ISOLATED; CULTURE IN PROGRESS FOR 5 DAYS   Report Status PENDING  Incomplete         Radiology Studies: No results found.      Scheduled Meds: . aspirin  81 mg Oral Daily  . docusate sodium  100 mg Oral Q12H  . heparin  5,000 Units Subcutaneous Q8H  . Immune Globulin 10%  400 mg/kg Intravenous Q24 Hr x 5  . lisinopril  5 mg Oral Daily  . metoprolol succinate  25 mg Oral Daily   Continuous Infusions:   LOS: 3 days     Cordelia Poche, MD [facility] 10/01/2016, 10:03 AM Pager: [phone]  If 7PM-7AM, please contact night-coverage [email or web address] Password [email or web address] 10/01/2016, 10:03 AM

## 2016-10-01 NOTE — Progress Notes (Signed)
Physical Therapy Treatment Patient Details Name: Eduardo Burch MRN: [id number] DOB: 02/23/1941 Today's Date: 10/01/2016    History of Present Illness Eduardo Burch is a 76 y.o. male with a history of prostate cancer status post prostatectomy abdominal hypertension, spondylosis. 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 Neurology on board and have started IVIG.    PT Comments    Upon arrival pt presents slightly irritated, but agreeable to mobilize with PT. Pt with decreased safety awareness and declines guarding by PT during ambulation. Pt however requires min guard A as pt staggers in hallway and reaches out to use hand rail for stability. Pt able to complete a flight of stairs today; however safety is of concern as pt has decreased safety awareness and decrease awareness of balance impairments. Pt also presents with bil LE pain and weakness with inability to complete some seated exercises such as SLR. Will continue to follow acutely for improved balance and strength. Pt declines use of cane today; however at end of session agrees he would try to use it next session.   Vitals:  HR pre ambulation 94 HR post ambulation 104  SpO2 in high 90s throughout on RA    Follow Up Recommendations  Outpatient PT;Supervision for mobility/OOB     Equipment Recommendations  Cane    Recommendations for Other Services       Precautions / Restrictions Precautions Precautions: Fall Restrictions Weight Bearing Restrictions: No    Mobility  Bed Mobility Overal bed mobility: Modified Independent             General bed mobility comments: Increased time  Transfers Overall transfer level: Needs assistance   Transfers: Sit to/from Stand Sit to Stand: Supervision         General transfer comment: Supervision for safety. Pt with decreased safety awareness.   Ambulation/Gait Ambulation/Gait assistance: Min guard Ambulation Distance (Feet): 200  Feet Assistive device: None Gait Pattern/deviations: Step-through pattern;Decreased stride length;Staggering left;Staggering right Gait velocity: decreased Gait velocity interpretation: Below normal speed for age/gender General Gait Details: Pt ambulated without AD; however presented with staggering gait and often reaching out to hold onto hand rails in hallway. Pt refuses to try to use a cane for stabillity.    Stairs Stairs: Yes   Stair Management: One rail Left Number of Stairs: 12 General stair comments: Min guard for safety. Pt with decreased safety awareness and required VCs to slow down and visual cues to make sure he was leaving enough room for both feet and not crossing feet.   Wheelchair Mobility    Modified Rankin (Stroke Patients Only)       Balance Overall balance assessment: Needs assistance Sitting-balance support: No upper extremity supported;Feet supported Sitting balance-Leahy Scale: Good     Standing balance support: No upper extremity supported;During functional activity Standing balance-Leahy Scale: Fair Standing balance comment: Pt able to ambulate without reliance on AD; however today he required intermittent use of rails in hallway to keep stable.                             Cognition Arousal/Alertness: Awake/alert Behavior During Therapy: WFL for tasks assessed/performed Overall Cognitive Status: Within Functional Limits for tasks assessed                                 General Comments: Pt irritated at times during session when  PT tried to guard patient for safety, firmly mentioning "I got it" while stumbling in hall and reaching out for rails for stability. Pt became defensive when PT asked if he would like to try using a cane for ambulation to increae stability.       Exercises      General Comments        Pertinent Vitals/Pain Pain Assessment: 0-10 Pain Score: 5  Pain Location: lower back  Pain Descriptors /  Indicators: Discomfort Pain Intervention(s): Limited activity within patient's tolerance;Monitored during session;Repositioned    Home Living                      Prior Function            PT Goals (current goals can now be found in the care plan section) Acute Rehab PT Goals Patient Stated Goal: To get stronger PT Goal Formulation: With patient Time For Goal Achievement: 10/15/16 Potential to Achieve Goals: Good Progress towards PT goals: Progressing toward goals    Frequency    Min 3X/week      PT Plan Current plan remains appropriate    Co-evaluation              AM-PAC PT "6 Clicks" Daily Activity  Outcome Measure  Difficulty turning over in bed (including adjusting bedclothes, sheets and blankets)?: None Difficulty moving from lying on back to sitting on the side of the bed? : None Difficulty sitting down on and standing up from a chair with arms (e.g., wheelchair, bedside commode, etc,.)?: None Help needed moving to and from a bed to chair (including a wheelchair)?: A Little Help needed walking in hospital room?: A Little Help needed climbing 3-5 steps with a railing? : A Little 6 Click Score: 21    End of Session Equipment Utilized During Treatment: Gait belt Activity Tolerance: Patient tolerated treatment well Patient left: in chair;with call bell/phone within reach   PT Visit Diagnosis: Unsteadiness on feet (R26.81);Muscle weakness (generalized) (M62.81);Pain;Difficulty in walking, not elsewhere classified (R26.2);Other abnormalities of gait and mobility (R26.89) Pain - part of body:  (back )     Time: 7628-3151 PT Time Calculation (min) (ACUTE ONLY): 18 min  Charges:  $Gait Training: 8-22 mins                    G Codes:       Elberta Leatherwood, SPT Acute Rehab [place] 10/01/2016, 9:32 AM

## 2016-10-01 NOTE — Progress Notes (Signed)
NIF > -40, VC 3.1L.  Excellent pt effort.

## 2016-10-01 NOTE — Progress Notes (Signed)
Subjective: Patient has no complaints. Still feels off balance when walking but stronger in his legs and some increase in sensation.   Exam: Vitals:   10/01/16 0616 10/01/16 0915  BP: (!) 159/94   Pulse: 85 94  Resp: 17   Temp: 97.6 F (36.4 C)     HEENT-  Normocephalic, no lesions, without obvious abnormality.  Normal external eye and conjunctiva.  Normal TM's bilaterally.  Normal auditory canals and external ears. Normal external nose, mucus membranes and septum.  Normal pharynx.    Neuro:  CN: Pupils are equal and round. They are symmetrically reactive from 3-->2 mm. EOMI without nystagmus. Facial sensation is intact to light touch. Face is symmetric at rest with normal strength and mobility. Hearing is intact to conversational voice. Palate elevates symmetrically and uvula is midline. Voice is normal in tone, pitch and quality. Bilateral SCM and trapezii are 5/5. Tongue is midline with normal bulk and mobility.  Motor: Normal bulk, tone, and strength. 5/5 throughout. No drift. Only has 45 degrees knee extension in sitting position but full strength.  Sensation: Intact to light touch in UE with 50 decreased sensation in LE compared to UE DTRs: 1+, symmetric UE and no KJ or AJ Toes downgoing bilaterally. No pathologic reflexes.  Coordination: Finger-to-nose  are without dysmetria Gait:    Pertinent Labs/Diagnostics: NIF > -40, VC 3.1L.  Excellent pt effort  Etta Quill PA-C [facility] [phone]  Impression:  76 year old man with nearly six-week course of worsening bilateral leg weakness following back pain. Exam consistent with asymmetric bil;ateral LE weakness, areflexia and mild sensory loss in the LE. No cranial nerve impairment. Spinal tap confirmed albumin cytological dissociation. Symptoms most consistent with Guillain-Barr syndrome. Will need further workup as outpatient to figure out if this is one of the other variants such as acute motor sensory or  CIDP or transverse myelitis.   Recommendations -Continue IVIG for a total of 5 days this is day 4.Patient is responding to IVIG and should be able complete treatment tomorrow.  -Aspirin 81 mg daily -Continued monitoring of NIF and FVCs -Treat constipation when necessary per primary team. -Tramadol 50mg  PO 1 for back pain. We'll try to avoid using opiates for pain but his pain level was high and he was not very responsive to Tylenol overnight and was in a lot of discomfort, hence this dose. Can continue tylenol PRN going forward. -Aggressive PT OT -Will need follow-up with neuromuscular specialist as outpatient. They are interested in following up at [facility] His PCP Dr. Laurann Montana will refer to the outpatient neurologist.  Neurology will continue to follow. Please call with questions.  10/01/2016, 1:18 PM   I examined the patient, reviewed the case and agree with above plan. Please call if any questions.

## 2016-10-02 ENCOUNTER — Inpatient Hospital Stay (HOSPITAL_COMMUNITY): Payer: Medicare Other

## 2016-10-02 DIAGNOSIS — R531 Weakness: Secondary | ICD-10-CM

## 2016-10-02 MED ORDER — LORAZEPAM 1 MG PO TABS
1.0000 mg | ORAL_TABLET | Freq: Once | ORAL | Status: AC | PRN
  Administered 2016-10-02: 1 mg via ORAL
  Filled 2016-10-02: qty 1

## 2016-10-02 MED ORDER — GADOBENATE DIMEGLUMINE 529 MG/ML IV SOLN
20.0000 mL | Freq: Once | INTRAVENOUS | Status: AC | PRN
  Administered 2016-10-02: 20 mL via INTRAVENOUS

## 2016-10-02 MED ORDER — LISINOPRIL 10 MG PO TABS
10.0000 mg | ORAL_TABLET | Freq: Every day | ORAL | Status: DC
  Administered 2016-10-03: 10 mg via ORAL
  Filled 2016-10-02: qty 1

## 2016-10-02 NOTE — Progress Notes (Signed)
PROGRESS NOTE    Eduardo Burch  ZHY:[id number] DOB: 03-Oct-1940 DOA: 09/28/2016 PCP: Lavone Orn, MD   Brief Narrative: Eduardo Burch is a 76 y.o. male with a history of prostate cancer status post prostatectomy abdominal hypertension, spondylosis. 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 Neurology on board and recommended 5 days of IVIG. MRI thoracic spine ordered. 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 CSF culture with no growth over last 24 hours. -Neurology recommendations: IVIG for 5 days; NIF and vital capacity; aspirin started in the setting of IVIG, MRI thoracic spine -Pending labs: oligoclonal bands -Physical therapy: Outpatient PT on discharge  Essential hypertension Blood pressure improved from yesterday. -Continue metoprolol -Increase lisinopril (started this admission) to 10mg  daily  Constipation Resolved with enema.   DVT prophylaxis: Heparin Code Status: Full code Family Communication: None at bedside Disposition Plan: Discharge in 1 days s/p IVIG and neurology clearance   Consultants:   Neurology  Procedures:   IVIG (7/27>>7/31  Antimicrobials:  None    Subjective: Weakness improving.  Objective: Vitals:   10/02/16 0000 10/02/16 0025 10/02/16 0535 10/02/16 0626  BP: (!) 152/81 (!) 157/81 (!) 172/91   Pulse: 80 76 89   Resp: 17 18 18    Temp: 98.2 F (36.8 C) 98.2 F (36.8 C) 98 F (36.7 C)   TempSrc: Oral Oral Oral   SpO2: 98% 98% 98%   Weight:    82.1 kg (181 lb 1.6 oz)  Height:       No intake or output data in the 24 hours ending 10/02/16 1108 Filed Weights   09/30/16 0500 10/01/16 0616 10/02/16 0626  Weight: 80.7 kg (177 lb 14.4 oz) 80.5 kg (177 lb 8 oz) 82.1 kg (181 lb 1.6  oz)    Examination:  General exam: Appears calm and comfortable Respiratory system: Respiratory effort normal. Central nervous system: Alert and oriented. Skin: No cyanosis. No rashes Psychiatry: Judgement and insight appear normal. Mood & affect appropriate.     Data Reviewed: I have personally reviewed following labs and imaging studies  CBC:  Recent Labs Lab 09/28/16 0620 09/29/16 0436  WBC 8.1 8.7  HGB 16.9 15.9  HCT 48.8 47.9  MCV 93.3 94.5  PLT 451* 295   Basic Metabolic Panel:  Recent Labs Lab 09/28/16 0620 09/29/16 0400  NA 138 135  K 4.5 4.1  CL 106 104  CO2 24 25  GLUCOSE 96 97  BUN 23* 19  CREATININE 0.82 0.66  CALCIUM 8.9 8.3*   GFR: Estimated Creatinine Clearance: 84.9 mL/min (by C-G formula based on SCr of 0.66 mg/dL). Liver Function Tests:  Recent Labs Lab 09/28/16 1648  ALBUMIN 3.5   No results for input(s): LIPASE, AMYLASE in the last 168 hours. No results for input(s): AMMONIA in the last 168 hours. Coagulation Profile:  Recent Labs Lab 09/28/16 1009  INR 0.94   Cardiac Enzymes: No results for input(s): CKTOTAL, CKMB, CKMBINDEX, TROPONINI in the last 168 hours. BNP (last 3 results) No results for input(s): PROBNP in the last 8760 hours. HbA1C: No results for input(s): HGBA1C in the last 72 hours. CBG: No results for input(s): GLUCAP in the last 168 hours. Lipid Profile: No results for input(s): CHOL, HDL, LDLCALC, TRIG, CHOLHDL, LDLDIRECT in the last 72 hours.  Thyroid Function Tests: No results for input(s): TSH, T4TOTAL, FREET4, T3FREE, THYROIDAB in the last 72 hours. Anemia Panel: No results for input(s): VITAMINB12, FOLATE, FERRITIN, TIBC, IRON, RETICCTPCT in the last 72 hours. Sepsis Labs: No results for input(s): PROCALCITON, LATICACIDVEN in the last 168 hours.  Recent Results (from the past 240 hour(s))  CSF culture     Status: None   Collection Time: 09/28/16 12:50 PM  Result Value Ref Range Status   Specimen  Description CSF  Final   Special Requests NONE  Final   Gram Stain   Final    WBC PRESENT, PREDOMINANTLY MONONUCLEAR NO ORGANISMS SEEN CYTOSPIN SMEAR    Culture NO GROWTH  Final   Report Status 10/01/2016 FINAL  Final  Anaerobic culture     Status: None (Preliminary result)   Collection Time: 09/28/16 12:50 PM  Result Value Ref Range Status   Specimen Description CSF  Final   Special Requests NONE  Final   Culture   Final    NO ANAEROBES ISOLATED; CULTURE IN PROGRESS FOR 5 DAYS   Report Status PENDING  Incomplete  Fungus Culture With Stain     Status: None (Preliminary result)   Collection Time: 09/28/16 12:50 PM  Result Value Ref Range Status   Fungus Stain Final report  Final    Comment: (NOTE) Performed At: [facility] [place], [place] [id number] Lindon Romp MD MQ:[id number]    Fungus (Mycology) Culture PENDING  Incomplete   Fungal Source CSF  Final  Fungus Culture Result     Status: None   Collection Time: 09/28/16 12:50 PM  Result Value Ref Range Status   Result 1 Comment  Final    Comment: (NOTE) KOH/Calcofluor preparation:  no fungus observed. Performed At: [facility] [address] [place], [place] [id number] Lindon Romp MD XO:[id number]          Radiology Studies: No results found.      Scheduled Meds: . aspirin  81 mg Oral Daily  . docusate sodium  100 mg Oral Q12H  . heparin  5,000 Units Subcutaneous Q8H  . Immune Globulin 10%  400 mg/kg Intravenous Q24 Hr x 5  . lisinopril  5 mg Oral Daily  . metoprolol succinate  25 mg Oral Daily   Continuous Infusions:   LOS: 4 days     Cordelia Poche, MD [facility] 10/02/2016, 11:08 AM Pager: [phone]  If 7PM-7AM, please contact night-coverage [email or web address] Password [email or web address] 10/02/2016, 11:08 AM

## 2016-10-02 NOTE — Progress Notes (Signed)
Subjective: Patient continues to feel much improved with some improvement with his balance. Again still has some decreased sensation in his lower extremities. Patient does not have a level but states that he did have a level at his abdominal region when initial symptoms started. The significant decrease sensation and weakness was from his abdominal region down to his knees but not below his knees.  Exam: Vitals:   10/02/16 0025 10/02/16 0535  BP: (!) 157/81 (!) 172/91  Pulse: 76 89  Resp: 18 18  Temp: 98.2 F (36.8 C) 98 F (36.7 C)    HEENT-  Normocephalic, no lesions, without obvious abnormality.  Normal external eye and conjunctiva.  Normal TM's bilaterally.  Normal auditory canals and external ears. Normal external nose, mucus membranes and septum.  Normal pharynx.    Neuro: LH:TDSKAJ are equal and round. They are symmetrically reactive from 3-->2 mm. EOMI without nystagmus. Facial sensation is intact to light touch. Face is symmetric at rest with normal strength and mobility. Hearing is intact to conversational voice. Palate elevates symmetrically and uvula is midline. Voice is normal in tone, pitch and quality. Bilateral SCM and trapezii are 5/5. Tongue is midline with normal bulk and mobility.  Motor:Normal bulk, tone, and strength. 5/5 throughout.No drift. Only has 45 degrees knee extension in sitting position but full strength.  Sensation: Intact to light touch in UE with 50 decreased sensation in LE compared to UE DTRs:1+, symmetric UE and no KJ or AJ Toes downgoing bilaterally. No pathologic reflexes.  Coordination: Finger-to-nose  are without dysmetria Gait:     Pertinent Labs/Diagnostics: We'll obtain MRI thoracic spine with and without contrast to evaluate for possible transverse myelitis as this is an atypical presentation for a IDP.      Impression:  76 year old man with nearly six-week course of worsening bilateral leg weakness following back pain. Exam  consistent with asymmetric bil;ateral LE weakness, areflexia and mild sensory loss in the LE. No cranial nerve impairment. Spinal tap confirmed albumin cytological dissociation. Symptoms most consistent with Guillain-Barr syndrome. Will need further workup as outpatient to figure out if this is one of the other variants such as acute motor sensory or CIDP or transverse myelitis. Obtaining MRI thoracic spine today  Recommendations -Continue IVIG for a total of 5 days this is day 5. Patient is responding to IVIG and should be able complete treatment today.  -Aspirin 81 mg daily -Continued monitoring of NIF and FVCs -Treat constipation when necessary per primary team. -Tramadol 57m PO 1 for back pain. We'll try to avoid using opiates for pain but his pain level was high and he was not very responsive to Tylenol overnight and was in a lot of discomfort, hence this dose. Can continue tylenol PRN going forward. -Aggressive PT OT--both inpatient and outpatient -Will need follow-up with neuromuscular specialist as outpatient. [facility]CP Dr. GLaurann Montanawill refer to the outpatient neurologist.  Neurology will continue to follow. Please call with questions.  DEtta QuillPA-C [facility] [phone]/31/2018, 9:21 AM

## 2016-10-02 NOTE — Care Management Important Message (Signed)
Important Message  Patient Details  Name: Eduardo Burch MRN: [id number] Date of Birth: 1941-01-06   Medicare Important Message Given:  Yes    [name] May 10/02/2016, 10:16 AM

## 2016-10-02 NOTE — Progress Notes (Signed)
Pt is not eager or receptive about doing these "breathing test" he states. He is not understanding why he needs to do this. RRT educated patient on his presentation with lower chimney weakness with concern for Gullain-Barre' syndrome (per MD Cordelia Poche note) .   Pt did the pulmonary mechanics with good effort.  Results are the following  NIF: >-40 VC: 3.4L/min

## 2016-10-02 NOTE — Progress Notes (Signed)
VC 3 L/min and NIF -40 cmh20.  Excellent effort

## 2016-10-03 DIAGNOSIS — G610 Guillain-Barre syndrome: Principal | ICD-10-CM

## 2016-10-03 DIAGNOSIS — E785 Hyperlipidemia, unspecified: Secondary | ICD-10-CM

## 2016-10-03 DIAGNOSIS — E871 Hypo-osmolality and hyponatremia: Secondary | ICD-10-CM

## 2016-10-03 DIAGNOSIS — Z8546 Personal history of malignant neoplasm of prostate: Secondary | ICD-10-CM

## 2016-10-03 LAB — CBC WITH DIFFERENTIAL/PLATELET
BASOS ABS: 0 10*3/uL (ref 0.0–0.1)
BASOS PCT: 1 %
EOS ABS: 0.1 10*3/uL (ref 0.0–0.7)
EOS PCT: 1 %
HCT: 43.1 % (ref 39.0–52.0)
Hemoglobin: 14.7 g/dL (ref 13.0–17.0)
LYMPHS PCT: 19 %
Lymphs Abs: 1.1 10*3/uL (ref 0.7–4.0)
MCH: 31.3 pg (ref 26.0–34.0)
MCHC: 34.1 g/dL (ref 30.0–36.0)
MCV: 91.9 fL (ref 78.0–100.0)
MONO ABS: 1 10*3/uL (ref 0.1–1.0)
Monocytes Relative: 18 %
Neutro Abs: 3.4 10*3/uL (ref 1.7–7.7)
Neutrophils Relative %: 61 %
PLATELETS: 352 10*3/uL (ref 150–400)
RBC: 4.69 MIL/uL (ref 4.22–5.81)
RDW: 13.9 % (ref 11.5–15.5)
WBC: 5.6 10*3/uL (ref 4.0–10.5)

## 2016-10-03 LAB — COMPREHENSIVE METABOLIC PANEL
ALT: 46 U/L (ref 17–63)
AST: 44 U/L — AB (ref 15–41)
Albumin: 2.6 g/dL — ABNORMAL LOW (ref 3.5–5.0)
Alkaline Phosphatase: 71 U/L (ref 38–126)
Anion gap: 6 (ref 5–15)
BUN: 20 mg/dL (ref 6–20)
CO2: 24 mmol/L (ref 22–32)
CREATININE: 0.61 mg/dL (ref 0.61–1.24)
Calcium: 8.4 mg/dL — ABNORMAL LOW (ref 8.9–10.3)
Chloride: 104 mmol/L (ref 101–111)
GFR calc Af Amer: >60 mL/min (ref >60)
Glucose, Bld: 84 mg/dL (ref 65–99)
POTASSIUM: 4.4 mmol/L (ref 3.5–5.1)
SODIUM: 134 mmol/L — AB (ref 135–145)
TOTAL PROTEIN: 8.5 g/dL — AB (ref 6.5–8.1)
Total Bilirubin: 0.6 mg/dL (ref 0.3–1.2)

## 2016-10-03 LAB — ANAEROBIC CULTURE

## 2016-10-03 LAB — PHOSPHORUS: Phosphorus: 3.7 mg/dL (ref 2.5–4.6)

## 2016-10-03 LAB — OLIGOCLONAL BANDS, CSF + SERM

## 2016-10-03 LAB — MAGNESIUM: MAGNESIUM: 2.1 mg/dL (ref 1.7–2.4)

## 2016-10-03 MED ORDER — ASPIRIN 81 MG PO CHEW: 81.0000 mg | CHEWABLE_TABLET | Freq: Every day | ORAL | 0 refills | Status: AC

## 2016-10-03 MED ORDER — TRAMADOL HCL 50 MG PO TABS: 50.0000 mg | ORAL_TABLET | Freq: Four times a day (QID) | ORAL | 0 refills | Status: DC | PRN

## 2016-10-03 MED ORDER — UNABLE TO FIND: 0 refills | Status: DC

## 2016-10-03 MED ORDER — LISINOPRIL 10 MG PO TABS: 10.0000 mg | ORAL_TABLET | Freq: Every day | ORAL | 0 refills | Status: DC

## 2016-10-03 NOTE — Progress Notes (Signed)
Subjective: Patient continues to feel much improved with some improvement with his balance.   Exam: Vitals:   10/03/16 0009 10/03/16 0509  BP: (!) 146/76 (!) 154/75  Pulse: 78 79  Resp: 18 17  Temp: 98.5 F (36.9 C) 98.4 F (36.9 C)    HEENT-  Normocephalic, no lesions, without obvious abnormality.  Normal external eye and conjunctiva.  Normal TM's bilaterally.  Normal auditory canals and external ears. Normal external nose, mucus membranes and septum.  Normal pharynx.    Neuro: QA:STMHDQ are equal and round. They are symmetrically reactive from 3-->2 mm. EOMI without nystagmus. Facial sensation is intact to light touch. Face is symmetric at rest with normal strength and mobility. Hearing is intact to conversational voice. Palate elevates symmetrically and uvula is midline. Voice is normal in tone, pitch and quality. Bilateral SCM and trapezii are 5/5. Tongue is midline with normal bulk and mobility.  Motor:Normal bulk, tone, and strength. 5/5 throughout.No drift. Only has 45 degrees knee extension in sitting position but full strength.  Sensation: Intact to light touch in UE with 50 decreased sensation in LE compared to UE DTRs:1+, symmetric UE and no KJ or AJ Toes downgoing bilaterally. No pathologic reflexes.  Coordination: Finger-to-nose  are without dysmetria Gait:     Pertinent Labs/Diagnostics: MRI showed no spinal pathology      Impression:  76 year old man with nearly six-week course of worsening bilateral leg weakness following back pain. Exam consistent with asymmetric bil;ateral LE weakness, areflexia and mild sensory loss in the LE. No cranial nerve impairment. Spinal tap confirmed albumin cytological dissociation. Symptoms most consistent with Guillain-Barr syndrome. Will need further workup as outpatient to figure out if this is one of the other variants such as acute motor sensory or CIDP.  Recommendations   -Aspirin 81 mg daily -Aggressive PT  OT--both inpatient and outpatient -Will need follow-up with neuromuscular specialist as outpatient. They are interested in following up at [facility]. His PCP Dr. Laurann Montana will refer to the outpatient neurologist.  Neurology S/O  Etta Quill PA-C [facility] [phone] 10/03/2016, 9:34 AM

## 2016-10-03 NOTE — Progress Notes (Signed)
Nsg Discharge Note  Admit Date:  09/28/2016 Discharge date: 10/03/2016   [facility] to be D/C'd Home per MD order.  AVS completed.  Copy for chart, and copy for patient signed, and dated. Patient/caregiver able to verbalize understanding.  Discharge Medication: Allergies as of 10/03/2016      Reactions   Remicade [infliximab] Anaphylaxis      Medication List    STOP taking these medications   naproxen 500 MG tablet Commonly known as:  NAPROSYN   oxyCODONE-acetaminophen 5-325 MG tablet Commonly known as:  PERCOCET/ROXICET     TAKE these medications   acetaminophen 500 MG tablet Commonly known as:  TYLENOL Take 500-1,000 mg by mouth every 6 (six) hours as needed for headache.   aspirin 81 MG chewable tablet Chew 1 tablet (81 mg total) by mouth daily.   docusate sodium 100 MG capsule Commonly known as:  COLACE Take 1 capsule (100 mg total) by mouth every 12 (twelve) hours.   GLUCOSAMINE 1500 COMPLEX Caps Take 1 capsule by mouth daily.   lisinopril 10 MG tablet Commonly known as:  PRINIVIL,ZESTRIL Take 1 tablet (10 mg total) by mouth daily.   metoprolol succinate 25 MG 24 hr tablet Commonly known as:  TOPROL XL Take 1 tablet (25 mg total) by mouth daily.   ondansetron 4 MG disintegrating tablet Commonly known as:  ZOFRAN ODT Take 1 tablet (4 mg total) by mouth every 6 (six) hours as needed for nausea or vomiting.   ONE-A-DAY MENS 50+ ADVANTAGE Tabs Take 1 tablet by mouth daily.   simvastatin 40 MG tablet Commonly known as:  ZOCOR Take 40 mg by mouth daily.   traMADol 50 MG tablet Commonly known as:  ULTRAM Take 1 tablet (50 mg total) by mouth every 6 (six) hours as needed for moderate pain or severe pain.   UNABLE TO FIND OUTPATIENT PHYSICAL THERAPY AND OCCUPATIONAL THERAPY  Dx: Guillain-Barre  Evaluation and Treat       Discharge Assessment: Vitals:   10/03/16 0009 10/03/16 0509  BP: (!) 146/76 (!) 154/75  Pulse: 78 79  Resp: 18 17  Temp: 98.5 F  (36.9 C) 98.4 F (36.9 C)   Skin clean, dry and intact without evidence of skin break down, no evidence of skin tears noted. IV catheter discontinued intact. Site without signs and symptoms of complications - no redness or edema noted at insertion site, patient denies c/o pain - only slight tenderness at site.  Dressing with slight pressure applied.  D/c Instructions-Education: Discharge instructions given to patient/family with verbalized understanding. D/c education completed with patient/family including follow up instructions, medication list, d/c activities limitations if indicated, with other d/c instructions as indicated by MD - patient able to verbalize understanding, all questions fully answered. Patient instructed to return to ED, call 911, or call MD for any changes in condition.  Patient escorted via [place], and D/C home via private auto.  Salley Slaughter, RN 10/03/2016 1:38 PM

## 2016-10-03 NOTE — Discharge Summary (Signed)
Physician Discharge Summary  Eduardo Burch KGU:[id number] DOB: [birth year] DOA: 09/28/2016  PCP: Lavone Orn, MD  Admit date: 09/28/2016 Discharge date: 10/03/2016  Admitted From: Home Disposition:  Home with Outpatient PT/OT  Recommendations for Outpatient Follow-up:  1. Follow up with PCP in 1-2 weeks 2. Follow up with Neurology/Neuromuscular Specialist within 1 week 3. Follow up with Oncology as an outpatient for Follow up of Hx of Prostate Cancer  4. Please obtain CMP/CBC, Mag, Phos in one week 5. Please follow up on the following pending results: Oligoclonal Bands; Repeat MRI of Throacic Spine in 3-6 Months   Home Health: No recommended outpatient PT Equipment/Devices: Cane    Discharge Condition: Stable CODE STATUS: FULL CODE Diet recommendation: Heart Healthy Diet  Brief/Interim Summary: Eduardo Burch is a 76 y.o. male with a history of prostate cancer status post prostatectomy abdominal hypertension, spondylosis who presented with lower extremity weakness with concern for Guillain-Barr syndrome. Neurology was consulted and recommended 5 days of IVIG. 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 HSV CSF negative, CSF cultures negative (fungal culture pending), IgG index 0.7, IgG CSF elevated and patient had spinal tap confirming albumin cytological dissociation. He improved and Neurology had no further recommendations except that he will need further workup as an outpatient to evaluate if it is one of the variants of Guillain-Barre such as Acute Motor Sensory or CIDP. Patient will also need to see a Neuromuscular specialist and will have PCP refer to Neuromuscular Specialist at [facility] as an outpatient. Patient was told that if his SOB worsened, Weakness worsened, or if he had difficulty urinating and defecating, that he would need to  come to the ED for evaluation. Patient was deemed stable to D/C home and will have outpatient PT/OT and follow up with PCP and Neuromuscular specialist as an outpatient.   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 CSF culture with no growth over last 24 hours but spinal tap confirmed albumin cytological dissociation.  -Neurology recommended : IVIG for 5 days; NIF and vital capacity; Aspirin started in the setting of IVIG,  -MRI thoracic spine showed Multiple scattered hemangiomas throughout the thoracic spine. Two lesions in the T6 and T11 vertebral bodies demonstrate hypointensity on T1, and are favored to represent atypical hemangiomas over osseous metastatic disease given recent negative bone scan. Correlation with PSA is recommended. Also consider short-term follow-up MRI in 3-6 months. -Pending Labs Oligoclonal bands -Physical therapy: Outpatient PT on discharge -Follow up with Neuromuscular Specialist at D/C and aggressive PT; PCP to refer patient to [place] Hypertension -Blood pressure improved from yesterday. -Continue metoprolol -C/w Lisinopril 10mg  daily at D/C  Constipation -Resolved with enema. -Changed Oxycodone-Acetaminophen to po Tramadol -Bowel Regimen  With Docusate q12h as an outpatient  -Follow up with PCP  HLD -C/w Simvastatin 40 mg po qDaily  Hx of Prostate Cancer -Follow up with PCP and Oncology as an outpatient  -Repeat PSA as an outpatient  -Recent Bone Scan showed Asymmetric uptake within the posterior left pelvis, L5, and S1 compatible with Paget's disease. The CT appearance is less consistent with metastatic disease. Anterior superior endplate fracture  at L2 demonstrates  uptake. Other degenerative uptake is present without evidence for metastatic disease.  Hyponatremia -Mild -Patients Na+ was 134 -Repeat CMP as an outpatient with PCP  Discharge Instructions  Discharge Instructions    Call MD for:  difficulty breathing, headache or visual disturbances    Complete by:  As directed    Call MD for:  extreme fatigue    Complete by:  As directed    Call MD for:  hives    Complete by:  As directed    Call MD for:  persistant dizziness or light-headedness    Complete by:  As directed    Call MD for:  persistant nausea and vomiting    Complete by:  As directed    Call MD for:  redness, tenderness, or signs of infection (pain, swelling, redness, odor or green/yellow discharge around incision site)    Complete by:  As directed    Call MD for:  severe uncontrolled pain    Complete by:  As directed    Call MD for:  temperature >100.4    Complete by:  As directed    Diet - low sodium heart healthy    Complete by:  As directed    Discharge instructions    Complete by:  As directed    Follow up with PCP and outpatient Neuromuscular Specialist. Take all medications as prescribed. If symptoms change or worsen including SOB, increased weakness, or inability to use the bathroom please return to the ED for evaluation.   Increase activity slowly    Complete by:  As directed      Allergies as of 10/03/2016      Reactions   Remicade [infliximab] Anaphylaxis      Medication List    STOP taking these medications   naproxen 500 MG tablet Commonly known as:  NAPROSYN   oxyCODONE-acetaminophen 5-325 MG tablet Commonly known as:  PERCOCET/ROXICET     TAKE these medications   acetaminophen 500 MG tablet Commonly known as:  TYLENOL Take 500-1,000 mg by mouth every 6 (six) hours as needed for headache.   aspirin 81 MG chewable tablet Chew 1 tablet (81 mg total) by mouth daily.   docusate sodium 100 MG capsule Commonly known as:  COLACE Take 1 capsule (100 mg  total) by mouth every 12 (twelve) hours.   GLUCOSAMINE 1500 COMPLEX Caps Take 1 capsule by mouth daily.   lisinopril 10 MG tablet Commonly known as:  PRINIVIL,ZESTRIL Take 1 tablet (10 mg total) by mouth daily.   metoprolol succinate 25 MG 24 hr tablet Commonly known as:  TOPROL XL Take 1 tablet (25 mg total) by mouth daily.   ondansetron 4 MG disintegrating tablet Commonly known as:  ZOFRAN ODT Take 1 tablet (4 mg total) by mouth every 6 (six) hours as needed for nausea or vomiting.   ONE-A-DAY MENS 50+ ADVANTAGE Tabs Take 1 tablet by mouth daily.   simvastatin 40 MG tablet Commonly known as:  ZOCOR Take 40 mg by mouth daily.   traMADol 50 MG tablet Commonly known as:  ULTRAM Take 1 tablet (50 mg total) by mouth every 6 (six) hours as needed for moderate pain or severe pain.   UNABLE TO FIND OUTPATIENT PHYSICAL THERAPY AND OCCUPATIONAL THERAPY  Dx: Guillain-Barre  Evaluation and Treat      Follow-up Information    Lavone Orn, MD. Call in 1 week(s).   Specialty:  Internal Medicine Why:  Call to scheudle a PCP appointment within  1 week and have PCP refer to Neuromuscular Specialist as an outpatient.  Contact information: [address]  [place] [address] [phone]          Allergies  Allergen Reactions  . Remicade [Infliximab] Anaphylaxis   Consultations:  Neurology Dr. Lorraine Lax  Procedures/Studies: Ct Head Wo Contrast  Result Date: 09/28/2016 CLINICAL DATA:  Lower extremity weakness. Concern for Guillain-Barre. EXAM: CT HEAD WITHOUT CONTRAST TECHNIQUE: Contiguous axial images were obtained from the base of the skull through the vertex without intravenous contrast. COMPARISON:  None. FINDINGS: Brain: No evidence for acute hemorrhage, mass lesion, midline shift or large infarct. Evidence for mild atrophy with some low density in the white matter suggesting chronic changes. Left temporal horn of the left lateral ventricle is asymmetrically  enlarged compared to the right side. Vascular: No hyperdense vessel or unexpected calcification. Skull: Normal. Negative for fracture or focal lesion. Sinuses/Orbits: Mucosal disease with bone thickening in the right maxillary sinus. Findings are suggestive for paranasal sinus disease. Other: None. IMPRESSION: Negative for acute hemorrhage. Mild atrophy with asymmetric enlargement of the left lateral ventricle temporal horn. Left temporal horn enlargement is of uncertain etiology. Electronically Signed   By: Markus Daft M.D.   On: 09/28/2016 11:27   Mr Thoracic Spine W Wo Contrast  Result Date: 10/02/2016 CLINICAL DATA:  Lower extremity weakness. Concern for [facility] or transverse myelitis. History of prostate cancer. EXAM: MRI THORACIC WITHOUT AND WITH CONTRAST TECHNIQUE: Multiplanar and multiecho pulse sequences of the thoracic spine were obtained without and with intravenous contrast. CONTRAST:  72mL MULTIHANCE GADOBENATE DIMEGLUMINE 529 MG/ML IV SOLN COMPARISON:  None. FINDINGS: MRI THORACIC SPINE FINDINGS Alignment:  Physiologic.  Slightly accentuated thoracic kyphosis. Vertebrae: There is mild chronic anterior wedging of T7 through T9. There are multiple scattered predominantly T1 and T2 hyperintense, enhancing lesions throughout the thoracic spine, most likely representing hemangiomas. However, a few lesions demonstrate T1 hypointensity, for example in the posterior aspect of the T6 vertebral body (series 5, image 10) and in the T11 vertebral body (series 5, image 7). No fracture or evidence of discitis. Cord: Normal signal and morphology. No abnormal enhancement identified. Paraspinal and other soft tissues: Bibasilar atelectasis. Bilateral renal cysts. Disc levels: Scattered areas of mild disc height loss. Focal central disc protrusion T6-T7 mildly indenting the right ventral cord mild left ligamentum flavum hypertrophy T11-T12. No high-grade spinal canal or neuroforaminal stenosis at any level.  IMPRESSION: 1. Normal spinal cord signal.  No enhancement. 2. 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 Two lesions in the T6 and T11 vertebral bodies demonstrate hypointensity on T1, and are favored to represent atypical hemangiomas over osseous metastatic disease given recent negative bone scan. Correlation with PSA is recommended. Also consider short-term follow-up MRI in 3-6 months. 3. Mild chronic anterior wedging of the T7 through T9 vertebral bodies. Electronically Signed   By: Titus Dubin M.D.   On: 10/02/2016 12:51   Mr Lumbar Spine W Wo Contrast  Result Date: 09/24/2016 CLINICAL DATA:  Low back pain.  Weakness in the lower extremities. EXAM: MRI LUMBAR SPINE WITHOUT AND WITH CONTRAST TECHNIQUE: Multiplanar and multiecho pulse sequences of the lumbar spine were obtained without and with intravenous contrast. CONTRAST:  81mL MULTIHANCE GADOBENATE DIMEGLUMINE 529 MG/ML IV SOLN COMPARISON:  CT abdomen 08/11/2016 Bone scan 08/23/2016 FINDINGS: Segmentation:  Standard. Alignment:  Physiologic. Vertebrae: No fracture, evidence of discitis, or aggressive bone lesion. Heterogeneous marrow in the L5 and S1 vertebral bodies and posterior elements as  well as the left ileum most consistent with Paget's disease. Conus medullaris: Extends to the L1 level and appears normal. Paraspinal and other soft tissues: No paraspinal abnormality. Disc levels: Disc spaces: Degenerative disc disease disc height loss at L5-S1. T12-L1: No significant disc bulge. No evidence of neural foraminal stenosis. No central canal stenosis. L1-L2: Minimal broad-based disc bulge. Mild bilateral facet arthropathy. No evidence of neural foraminal stenosis. No central canal stenosis. L2-L3: Mild broad-based disc bulge. Moderate bilateral facet arthropathy. Mild spinal stenosis. Bilateral lateral recess narrowing. Mild bilateral foraminal stenosis. L3-L4: No significant disc bulge. No evidence of neural foraminal stenosis.  No central canal stenosis. Mild bilateral facet arthropathy. L4-L5: Mild broad-based disc bulge. Mild bilateral facet arthropathy. Mild right foraminal stenosis. No central canal stenosis. L5-S1: Central focal disc osteophyte complex impressing on the thecal sac. Bilateral lateral recess narrowing. No evidence of neural foraminal stenosis. No central canal stenosis. IMPRESSION: 1. No aggressive osseous lesion to suggest metastatic disease. 2. Osseous changes at L5, S1 and left ilium most compatible with Paget's disease. 3. Lumbar spine spondylosis as described above. Electronically Signed   By: Kathreen Devoid   On: 09/24/2016 11:20   Mr Pelvis W Wo Contrast  Addendum Date: 09/21/2016   ADDENDUM REPORT: 09/21/2016 12:55 ADDENDUM: This addendum is given for the purpose of noting the L5-S1 level is autologously fused as seen on the prior CT scan. There is mild degenerative endplate edema in the superior endplate of L5. Disc bulge with endplate spur at K2-H0 does not appear to cause central canal stenosis. Electronically Signed   By: Inge Rise M.D.   On: 09/21/2016 12:55   Result Date: 09/21/2016 CLINICAL DATA:  History of prostate carcinoma. Lesion in the pelvis. Question Paget's disease. EXAM: MRI PELVIS WITHOUT AND WITH CONTRAST TECHNIQUE: Multiplanar multisequence MR imaging of the pelvis was performed both before and after administration of intravenous contrast. CONTRAST:  18 ml MULTIHANCE GADOBENATE DIMEGLUMINE 529 MG/ML IV SOLN COMPARISON:  Images from a cystogram 10/19/2015. Whole-body bone scan 08/14/2005 and 08/23/2016. CT abdomen and pelvis 08/11/2016. FINDINGS: There is no evidence of osseous metastatic disease. Expansion and cortical thickening in the L5 vertebral body, sacrum and iliac wings, much more extensive on the left, consistent with Paget's disease is again identified. The SI joints are ankylosed, likely degenerative in nature. There is no avascular necrosis of the femoral heads. No  fracture is identified. Cyst along the posterior, superior left acetabular labrum measuring 2.8 cm AP by 2.3 cm craniocaudal by 1.7 cm transverse is consistent with a paralabral cyst indicative of labral tear. All imaged muscles and tendons are intact. Imaged intrapelvic contents demonstrate sigmoid diverticulosis. The patient is status post prostatectomy. IMPRESSION: Negative for metastatic disease. 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 Cyst along the posterior, superior left acetabular labrum is consistent with a paralabral cyst indicative of a labral tear. Status post prostatectomy. Sigmoid diverticulosis. Electronically Signed: By: Inge Rise M.D. On: 09/13/2016 07:45   Dg Bone Density (dxa)  Result Date: 09/07/2016 EXAM: DUAL X-RAY ABSORPTIOMETRY (DXA) FOR BONE MINERAL DENSITY IMPRESSION: Referring Physician:  Hennie Duos PATIENT: Name: Eduardo Burch, Eduardo Burch Patient ID: [id number] Birth Date: 07-04-1940 Height: 72.0 in. Sex: Male Measured: 09/07/2016 Weight: 191.1 lbs. Indications: Advanced Age, Caucasian Fractures: None Treatments: Vitamin D (E933.5) ASSESSMENT: The BMD measured at Femur Neck Left is 0.877 g/cm2 with a T-score of -1.2. This patient is considered osteopenic according to the [facility]) criteria. L-2 and L-3  were excluded due to degenerative changes. Site Region Measured Date Measured Age [age]A BMD Significant CHANGE T-score DualFemur Neck Left 09/07/2016 77.8 -1.2 0.877 g/cm2 AP Spine L1-L4 (L2,L3) 09[age]07/2016 77.8 2.1 1.449 g/cm2 World Health Organization [facility]) criteria for post-menopausal, Caucasian Women: Normal       T-score at or above -1 SD Osteopenia   T-score between -1 and -2.5 SD Osteoporosis T-score at or below -2.5 SD RECOMMENDATION: [place] recommends that FDA-approved medical therapies be considered in postmenopausal women and men age 28 or older with a: 1. Hip or vertebral (clinical or morphometric) fracture. 2.  T-score of <-2.5 at the spine or hip. 3. Ten-year fracture probability by FRAX of 3% or greater for hip fracture or 20% or greater for major osteoporotic fracture. All treatment decisions require clinical judgment and consideration of individual patient factors, including patient preferences, co-morbidities, previous drug use, risk factors not captured in the FRAX model (e.g. falls, vitamin D deficiency, increased bone turnover, interval significant decline in bone density) and possible under - or over-estimation of fracture risk by FRAX. All patients should ensure an adequate intake of dietary calcium (1200 mg/d) and vitamin D (800 IU daily) unless contraindicated. FOLLOW-UP: People with diagnosed cases of osteoporosis or osteopenia should be regularly tested for bone mineral density. For patients eligible for Medicare, routine testing is allowed once every 2 years. The testing frequency can be increased to one year for patients who have rapidly progressing disease, or for those who are receiving medical therapy to restore bone mass. I have reviewed this report, and agree with the above findings. [facility] Electronically Signed   By: Ilona Sorrel M.D.   On: 09/07/2016 13:55   Dg Lumbar Puncture Fluoro Guide  Result Date: 09/28/2016 CLINICAL DATA:  progressive leg weakness, concern for guillain barre EXAM: DIAGNOSTIC LUMBAR PUNCTURE UNDER FLUOROSCOPIC GUIDANCE FLUOROSCOPY TIME:  Fluoroscopy Time:  3  Min  42  Sec Radiation Exposure Index (if provided by the fluoroscopic device): na Number of Acquired Spot Images: 3 PROCEDURE: Informed consent was obtained from the patient prior to the procedure, including potential complications of headache, allergy, and pain. With the patient prone, the lower back was prepped with Betadine. 1% Lidocaine was used for local anesthesia. Lumbar puncture was performed at the L3-4 level using a 22 gauge needle with return of clear CSF with an opening pressure of 16 cm  water. Eight ml of CSF were obtained for laboratory studies. The patient tolerated the procedure well and there were no apparent complications. IMPRESSION: Technically successful lumbar puncture Electronically Signed   By: Kerby Moors M.D.   On: 09/28/2016 13:28    Subjective: Seen and examined at beside and felt better. No nausea or vomiting but stated he did not sleep well. States weakness has been improving. No other concerns or complaints at this time.   Discharge Exam: Vitals:   10/03/16 0009 10/03/16 0509  BP: (!) 146/76 (!) 154/75  Pulse: 78 79  Resp: 18 17  Temp: 98.5 F (36.9 C) 98.4 F (36.9 C)   Vitals:   10/02/16 2358 10/03/16 0009 10/03/16 0508 10/03/16 0509  BP: (!) 146/77 (!) 146/76  (!) 154/75  Pulse: 83 78  79  Resp: 17 18  17   Temp: 99.2 F (37.3 C) 98.5 F (36.9 C)  98.4 F (36.9 C)  TempSrc: Oral Oral  Oral  SpO2: 99% 96%  97%  Weight:   82 kg (180 lb 12.8 oz)   Height:  General: Pt is alert, awake, not in acute distress Cardiovascular: RRR, S1/S2 +, no rubs, no gallops Respiratory: CTA bilaterally, no wheezing, no rhonchi Abdominal: Soft, NT, ND, bowel sounds + Extremities: no edema, no cyanosis; DTRs 1+ (Right worse than Left)  The results of significant diagnostics from this hospitalization (including imaging, microbiology, ancillary and laboratory) are listed below for reference.    Microbiology: Recent Results (from the past 240 hour(s))  CSF culture     Status: None   Collection Time: 09/28/16 12:50 PM  Result Value Ref Range Status   Specimen Description CSF  Final   Special Requests NONE  Final   Gram Stain   Final    WBC PRESENT, PREDOMINANTLY MONONUCLEAR NO ORGANISMS SEEN CYTOSPIN SMEAR    Culture NO GROWTH  Final   Report Status 10/01/2016 FINAL  Final  Anaerobic culture     Status: None   Collection Time: 09/28/16 12:50 PM  Result Value Ref Range Status   Specimen Description CSF  Final   Special Requests NONE  Final    Culture NO ANAEROBES ISOLATED  Final   Report Status 10/03/2016 FINAL  Final  Fungus Culture With Stain     Status: None (Preliminary result)   Collection Time: 09/28/16 12:50 PM  Result Value Ref Range Status   Fungus Stain Final report  Final    Comment: (NOTE) Performed At: [facility] [place], [place] [id number] Lindon Romp MD [id number]    Fungus (Mycology) Culture PENDING  Incomplete   Fungal Source CSF  Final  Fungus Culture Result     Status: None   Collection Time: 09/28/16 12:50 PM  Result Value Ref Range Status   Result 1 Comment  Final    Comment: (NOTE) KOH/Calcofluor preparation:  no fungus observed. Performed At: [facility] [place], [place] [id number] Lindon Romp MD VZ:[id number]     Labs: BNP (last 3 results) No results for input(s): BNP in the last 8760 hours. Basic Metabolic Panel:  Recent Labs Lab 09/28/16 0620 09/29/16 0400 10/03/16 0930  NA 138 135 134*  K 4.5 4.1 4.4  CL 106 104 104  CO2 24 25 24   GLUCOSE 96 97 84  BUN 23* 19 20  CREATININE 0.82 0.66 0.61  CALCIUM 8.9 8.3* 8.4*  MG  --   --  2.1  PHOS  --   --  3.7   Liver Function Tests:  Recent Labs Lab 09/28/16 1648 10/03/16 0930  AST  --  44*  ALT  --  46  ALKPHOS  --  71  BILITOT  --  0.6  PROT  --  8.5*  ALBUMIN 3.5 2.6*   No results for input(s): LIPASE, AMYLASE in the last 168 hours. No results for input(s): AMMONIA in the last 168 hours. CBC:  Recent Labs Lab 09/28/16 0620 09/29/16 0436 10/03/16 0930  WBC 8.1 8.7 5.6  NEUTROABS  --   --  3.4  HGB 16.9 15.9 14.7  HCT 48.8 47.9 43.1  MCV 93.3 94.5 91.9  PLT 451* 380 352   Cardiac Enzymes: No results for input(s): CKTOTAL, CKMB, CKMBINDEX, TROPONINI in the last 168 hours. BNP: Invalid input(s): POCBNP CBG: No results for input(s): GLUCAP in the last 168 hours. D-Dimer No results for input(s): DDIMER in the last 72 hours. Hgb A1c No results for  input(s): HGBA1C in the last 72 hours. Lipid Profile No results for input(s): CHOL, HDL, LDLCALC, TRIG, CHOLHDL, LDLDIRECT in the  last 72 hours. Thyroid function studies No results for input(s): TSH, T4TOTAL, T3FREE, THYROIDAB in the last 72 hours.  Invalid input(s): FREET3 Anemia work up No results for input(s): VITAMINB12, FOLATE, FERRITIN, TIBC, IRON, RETICCTPCT in the last 72 hours. Urinalysis    Component Value Date/Time   COLORURINE YELLOW 08/11/2016 0130   APPEARANCEUR HAZY (A) 08/11/2016 0130   LABSPEC 1.026 08/11/2016 0130   PHURINE 5.0 08/11/2016 0130   GLUCOSEU NEGATIVE 08/11/2016 0130   HGBUR MODERATE (A) 08/11/2016 0130   BILIRUBINUR NEGATIVE 08/11/2016 0130   KETONESUR 5 (A) 08/11/2016 0130   PROTEINUR >=300 (A) 08/11/2016 0130   NITRITE NEGATIVE 08/11/2016 0130   LEUKOCYTESUR NEGATIVE 08/11/2016 0130   Sepsis Labs Invalid input(s): PROCALCITONIN,  WBC,  LACTICIDVEN Microbiology Recent Results (from the past 240 hour(s))  CSF culture     Status: None   Collection Time: 09/28/16 12:50 PM  Result Value Ref Range Status   Specimen Description CSF  Final   Special Requests NONE  Final   Gram Stain   Final    WBC PRESENT, PREDOMINANTLY MONONUCLEAR NO ORGANISMS SEEN CYTOSPIN SMEAR    Culture NO GROWTH  Final   Report Status 10/01/2016 FINAL  Final  Anaerobic culture     Status: None   Collection Time: 09/28/16 12:50 PM  Result Value Ref Range Status   Specimen Description CSF  Final   Special Requests NONE  Final   Culture NO ANAEROBES ISOLATED  Final   Report Status 10/03/2016 FINAL  Final  Fungus Culture With Stain     Status: None (Preliminary result)   Collection Time: 09/28/16 12:50 PM  Result Value Ref Range Status   Fungus Stain Final report  Final    Comment: (NOTE) Performed At: [facility] [place], [place] [id number] Lindon Romp MD [id number]    Fungus (Mycology) Culture PENDING  Incomplete   Fungal Source CSF   Final  Fungus Culture Result     Status: None   Collection Time: 09/28/16 12:50 PM  Result Value Ref Range Status   Result 1 Comment  Final    Comment: (NOTE) KOH/Calcofluor preparation:  no fungus observed. Performed At: [facility] [place], [place] [id number] Lindon Romp MD [id number]    Time coordinating discharge: 35 minutes  SIGNED:  Kerney Elbe, DO [facility] 10/03/2016, 1:00 PM Pager [phone]  If 7PM-7AM, please contact night-coverage [email or web address] Password [email or web address]

## 2016-10-08 ENCOUNTER — Encounter: Payer: Self-pay | Admitting: Physical Therapy

## 2016-10-08 ENCOUNTER — Ambulatory Visit: Payer: Medicare Other | Attending: Internal Medicine | Admitting: Physical Therapy

## 2016-10-08 VITALS — BP 140/90 | HR 108

## 2016-10-08 DIAGNOSIS — R208 Other disturbances of skin sensation: Secondary | ICD-10-CM | POA: Insufficient documentation

## 2016-10-08 DIAGNOSIS — M545 Low back pain, unspecified: Secondary | ICD-10-CM

## 2016-10-08 DIAGNOSIS — M6281 Muscle weakness (generalized): Secondary | ICD-10-CM | POA: Insufficient documentation

## 2016-10-08 DIAGNOSIS — R2689 Other abnormalities of gait and mobility: Secondary | ICD-10-CM | POA: Diagnosis present

## 2016-10-08 DIAGNOSIS — G8929 Other chronic pain: Secondary | ICD-10-CM | POA: Diagnosis present

## 2016-10-08 NOTE — Therapy (Signed)
[facility] [address] [place], [place], [address] Phone: [phone]   Fax:  [phone]  Physical Therapy Evaluation  Patient Details  Name: Eduardo Burch MRN: [id number] Date of Birth: Jul 16, 1940 Referring Provider: Lavone Orn, MD (PCP; pt referred by hospitalist but will be followed by PCP)  Encounter Date: 10/08/2016      PT End of Session - 10/08/16 1006    Visit Number 1   Number of Visits 17   Date for PT Re-Evaluation 12/07/16   Authorization Type [place] code and 10th visit PN   PT Start Time 0800   PT Stop Time 0850   PT Time Calculation (min) 50 min   Activity Tolerance Patient limited by pain   Behavior During Therapy [facility] for tasks assessed/performed      Past Medical History:  Diagnosis Date  . Constipated   . Elevated blood pressure reading   . 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 Hypercholesterolemia   . Hypertension   . Spondylosis     Past Surgical History:  Procedure Laterality Date  . PROATATECTOMY      Vitals:   10/08/16 0809  BP: 140/90  Pulse: (!) 108  SpO2: 98%         Subjective Assessment - 10/08/16 0810    Subjective Pt reports back pain started 7 weeks ago, went to see PCP due to constant pain.  Recieved a steroid injection but without relief.  Went to ED due to LE weakness and pain-work up suspicous of GBS.  Treated with IVIG but pain and weakness worsened in the hospital but pt d/c home with wife.  Had work up by neurologist at [facility] which revealed spinal compression at levels of L4-L5.  Pt has consultation with neurosurgeon.  Reports LE weakness, numbness in gluteal area and constant LBP that does not radiate into the LE.  Denies bowel or bladder issues.     Patient is accompained by: Family member   Pertinent History prostate CA with prostatectomy-NO ESTIM or kinesiotape; akylosing spondylitis, HTN-assess vitals each visit,  Paget's disease   Limitations Standing;Walking;Sitting   How long can you sit comfortably? 10 minutes   How long can you stand comfortably? 10 minutes   How long can you walk comfortably? 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 Yes   Pain Score 7    Pain Location Back   Pain Orientation Lower   Pain Descriptors / Indicators Aching;Dull   Pain Type Chronic pain   Pain Frequency Constant            [facility] PT Assessment - 10/08/16 0818      Assessment   Referring Provider Lavone Orn, MD (PCP; pt referred by hospitalist but will be followed by PCP)   Onset Date/Surgical Date 08/20/16   Next MD Visit tomorrow PCP   Prior Therapy chiropractor for low back issues; pt feels this has been a progressive issues over the years     Precautions   Precautions Back;Other (comment)   Precaution Comments h/o prostate CA with prostatectomy (NO ESTIM OR KINESIOTAPE), ankylosing spondylitis, HTN-MONITOR VITALS AT [facility] VISIT     Balance Screen   Has the patient fallen in the past 6 months No   Has the patient had a decrease in activity level because of a fear  of falling?  Yes   Is the patient reluctant to leave their home because of a fear of falling?  No     Home Environment   Living Environment Private residence   Living Arrangements Spouse/significant other   Type of [place] to enter   Entrance Stairs-Number of Steps 5   Entrance Stairs-Rails Right   Home Layout Two level   Alternate Level Stairs-Number of Steps 10   Alternate [place] - 2 wheels;Cane - single point   Additional Comments has lost 30 lbs in 7 weeks     Prior Function   Level of Independence Independent   Leisure working out at the gym     Observation/Other Assessments   Focus on Therapeutic Outcomes (FOTO)  12 (88% limited; predicted 57%  limited)   Other Surveys  Other Surveys   Neuro Quality of Life  LE: 29.2%     Sensation   Light Touch Impaired by gross assessment   Hot/Cold Appears Intact   Additional Comments numbness proximally in gluteal and groin region     ROM / Strength   AROM / PROM / Strength Strength;PROM     PROM   PROM Assessment Site Hip   Right/Left Hip Right;Left   Right Hip Internal Rotation  13   Left Hip Flexion 30 deg with knee straight due to hamstring tightness   Left Hip Internal Rotation  22     Strength   Overall Strength Deficits   Overall Strength Comments RLE 2/5 hip flexion and knee extension, knee flexion and ankle DF 4-/5; LLE: 4-/5 except hip flexion 3+/5     Right Hip   Right Hip Flexion 45  with knee straight due to hamstring tightness     Ambulation/Gait   Ambulation Distance (Feet) 115 Feet   Assistive device Straight cane   Gait Pattern Step-through pattern;Decreased stride length;Decreased hip/knee flexion - right;Decreased dorsiflexion - right;Right flexed knee in stance;Scissoring;Antalgic;Narrow base of support;Trunk flexed;Poor foot clearance - right   Ambulation Surface Level;Indoor   Gait velocity 2.7 ft/sec   Stairs Yes   Stairs Assistance 5: Supervision   Stair Management Technique One rail Right;One rail Left;Forwards;With cane   Number of Stairs 4   Height of Stairs 6   Ramp 4: Min assist   Curb 3: Mod assist   Curb Details (indicate cue type and reason) increaesed assistance to ascend curb with one UE support on cane     Standardized Balance Assessment   Standardized Balance Assessment Five Times Sit to Stand;10 meter walk test   Five times sit to stand comments  18.51 seconds   10 Meter Walk 12.13 seconds or 2.7 ft/sec            Objective measurements completed on examination: See above findings.                  PT Education - 10/08/16 1006    Education provided Yes   Education Details clinical findings, PT POC, goals    Person(s) Educated Patient;Spouse   Methods Explanation   Comprehension Verbalized understanding          PT Short Term Goals - 10/08/16 1034      PT SHORT TERM GOAL #1   Title Pt will demonstrate independence with initial stretching and strengthening HEP   Time 4   Period Weeks   Status New   Target Date 11/07/16  PT SHORT TERM GOAL #2   Title Pt will report decreased pain to < or = 5/10 on a daily basis   Baseline 7-8/10 constant   Time 4   Period Weeks   Status New   Target Date 11/07/16     PT SHORT TERM GOAL #3   Title Pt will improve functional LE strength to decrease five times sit to stand score to <16 seconds   Baseline 18.51 from chair with two arm rests   Time 4   Period Weeks   Status New   Target Date 11/07/16     PT SHORT TERM GOAL #4   Title Pt will decrease falls risk during gait in community as indicated by increase in gait velocity to >2.9 ft/sec   Baseline 2.7   Time 4   Period Weeks   Status New   Target Date 11/07/16     PT SHORT TERM GOAL #5   Title Pt will ambulate x 500' over indoor and outdoor surfaces including ramp, curb, pavement with LRAD and negotiate 10 stairs with one rail (no cane) and supervision with minimal foot drag and no LOB   Baseline min A   Time 4   Period Weeks   Status New   Target Date 11/07/16           PT Long Term Goals - 10/08/16 1046      PT LONG TERM GOAL #1   Title Pt will be independent with stretching/strengthening and balance HEP   Time 8   Period Weeks   Status New   Target Date 12/07/16     PT LONG TERM GOAL #2   Title Pt will report pain as < or = 3/10 on a daily basis   Time 8   Period Weeks   Status New   Target Date 12/07/16     PT LONG TERM GOAL #3   Title Pt will improve LE strength to 4-/5 overall RLE, 4+/5 overall LLE and improve five times sit to stand to < or = 12 seconds   Time 8   Period Weeks   Status New   Target Date 12/07/16     PT LONG TERM GOAL #4   Title Pt will  decrease falls risk during gait in community as indicated by gait velocity >3.24ft/sec   Time 8   Period Weeks   Status New   Target Date 12/07/16     PT LONG TERM GOAL #5   Title Pt will improve Neuro QOL LE score by 15%   Baseline 29.2%   Time 8   Period Weeks   Status New   Target Date 12/07/16     Additional Long Term Goals   Additional Long Term Goals Yes     PT LONG TERM GOAL #6   Title Lumbar, SI, Hip ROM goals TBD at next visit                Plan - 10/08/16 1007    Clinical Impression Statement Pt is a 76 year old male presenting to OPPT neuro for medium complexity PT evaluation for significant LE weakness progressive in nature.  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 Two lesions in the T6 and T11 vertebral bodies demonstrate hypointensity on T1, and are favored to represent atypical hemangiomas over osseous metastatic disease given recent negative bone scan". Hospital physicians suspicious of GBS.  Pt's f/u with Neurology revealed acute on chronic nerve compression at L4 and L5 levels, no metastatic disease.  Numbness, weakness and pain have progressively worsening over past 7 weeks.  Pt's PMH significant for the following: ankylosing spondylitis, HTN, prostate cancer with prostatectomy. The following deficits were noted during pt's exam: pain in low back, impaired sensation and numbness in proximal ventral and dorsal L2, L3, L4, L5 dermatomes, impaired strength/paresis bilat LE R > LLE weakness, impaired lumbar, pelvic and hip ROM, impaired balance, and impaired gait with bilat foot drag. Pt currently ambulates with a cane but was independent prior to hospitalization.  Pt's gait speed indicates pt is safe for community ambulation with AD but is below normal walking speed. Pt's condition is evolving and would benefit from skilled PT to address these impairments  and functional limitations to maximize functional mobility independence and reduce falls risk.   History and Personal Factors relevant to plan of care: independent prior to 7 weeks ago, now must ambulate with cane for safe ambulation, h/o prostate CA with prostatectomy, ankylosing spondylitis, progressive numbness, weakness and constant pain, HTN   Clinical Presentation Evolving   Clinical Presentation due to: independent prior to 7 weeks ago, now must ambulate with cane for safe ambulation, HTN, h/o prostate CA with prostatectomy, ankylosing spondylitis, progressive numbness, weakness and pain due to nn compression   Clinical Decision Making Moderate   Rehab Potential Good   Clinical Impairments Affecting Rehab Potential weakness and numbness have been progressive in nature, ankylosing spondylitis, unable to use convention pain management techniques of Estim or kinesiotape due to h/o CA   PT Frequency 2x / week   PT Duration 8 weeks   PT Treatment/Interventions ADLs/Self Care Home Management;Aquatic Therapy;Moist Heat;Traction;DME Instruction;Gait training;Stair training;Functional mobility training;Therapeutic activities;Therapeutic exercise;Balance training;Neuromuscular re-education;Patient/family education;Orthotic Fit/Training;Passive range of motion   PT Next Visit Plan MONITOR VITALS AT [facility] VISIT!  perform more indepth assessment of lumbar and pelvic ROM and neural tension, initiate stretching program, lumbar/pelvic mobilization?   Consulted and Agree with Plan of Care Patient;Family member/caregiver   Family Member Consulted wife      Patient will benefit from skilled therapeutic intervention in order to improve the following deficits and impairments:  Pain, Decreased balance, Decreased range of motion, Decreased strength, Difficulty walking, Impaired flexibility, Impaired sensation  Visit Diagnosis: Chronic midline low back pain without sciatica  Muscle weakness (generalized)  Other  disturbances of skin sensation  Other abnormalities of gait and mobility      G-Codes - 10-20-2016 1052    Functional Assessment Tool Used (Outpatient Only) Neuro QOL, gait speed, five times sit to stand   Functional Limitation Mobility: Walking and moving around   Mobility: Walking and Moving Around Current Status [phone]) At least 60 percent but less than 80 percent impaired, limited or restricted   Mobility: Walking and Moving Around Goal Status [phone]) At least 20 percent but less than 40 percent impaired, limited or restricted       Problem List Patient Active Problem List   Diagnosis Date Noted  . Hyperlipidemia 10/03/2016  . H/O prostate cancer 10/03/2016  . Hyponatremia 10/03/2016  . Guillain Barr syndrome ([place]) 10/03/2016  . Leg weakness 09/28/2016  . Hypertension   . Constipated   . Tachycardia 08/16/2016  . Hypertension, essential 08/15/2016    Raylene Everts, PT, DPT 10-20-2016    10:54 AM    [facility] [address] [place], [place], [address] Phone: [phone]   Fax:  [phone]  Name: Antoino Westhoff MRN: [id number] Date of Birth: April 06, 1940

## 2016-10-11 ENCOUNTER — Ambulatory Visit: Payer: Medicare Other | Admitting: Physical Therapy

## 2016-10-11 DIAGNOSIS — M545 Low back pain: Principal | ICD-10-CM

## 2016-10-11 DIAGNOSIS — M6281 Muscle weakness (generalized): Secondary | ICD-10-CM

## 2016-10-11 DIAGNOSIS — R208 Other disturbances of skin sensation: Secondary | ICD-10-CM

## 2016-10-11 DIAGNOSIS — R2689 Other abnormalities of gait and mobility: Secondary | ICD-10-CM

## 2016-10-11 DIAGNOSIS — G8929 Other chronic pain: Secondary | ICD-10-CM

## 2016-10-11 NOTE — Therapy (Signed)
[facility] [address] [place], [place], [address] Phone: [phone]   Fax:  [phone]  Physical Therapy Encounter  Patient Details  Name: Eduardo Burch MRN: [id number] Date of Birth: [birth year] Referring Provider: Lavone Orn, MD (PCP; pt referred by hospitalist but will be followed by PCP)  Encounter Date: 10/11/2016; arrive no charge-not medically stable to participate in therapy      PT End of Session - 10/11/16 3151    Visit Number 1  arrive no charge   Number of Visits 17   Date for PT Re-Evaluation 12/07/16   Authorization Type [place] code and 10th visit PN   PT Start Time [phone]   PT Stop Time 0900   PT Time Calculation (min) 10 min   Activity Tolerance Patient limited by pain   Behavior During Therapy [facility] for tasks assessed/performed      Past Medical History:  Diagnosis Date  . Constipated   . Elevated blood pressure reading   . 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 Hypercholesterolemia   . Hypertension   . Spondylosis     Past Surgical History:  Procedure Laterality Date  . PROATATECTOMY      There were no vitals filed for this visit.        PT Short Term Goals - 10/08/16 1034      PT SHORT TERM GOAL #1   Title Pt will demonstrate independence with initial stretching and strengthening HEP   Time 4   Period Weeks   Status New   Target Date 11/07/16     PT SHORT TERM GOAL #2   Title Pt will report decreased pain to < or = 5/10 on a daily basis   Baseline 7-8/10 constant   Time 4   Period Weeks   Status New   Target Date 11/07/16     PT SHORT TERM GOAL #3   Title Pt will improve functional LE strength to decrease five times sit to stand score to <16 seconds   Baseline 18.51 from chair with two arm rests   Time 4   Period Weeks   Status New   Target Date 11/07/16     PT SHORT TERM GOAL #4   Title Pt will decrease falls risk  during gait in community as indicated by increase in gait velocity to >2.9 ft/sec   Baseline 2.7   Time 4   Period Weeks   Status New   Target Date 11/07/16     PT SHORT TERM GOAL #5   Title Pt will ambulate x 500' over indoor and outdoor surfaces including ramp, curb, pavement with LRAD and negotiate 10 stairs with one rail (no cane) and supervision with minimal foot drag and no LOB   Baseline min A   Time 4   Period Weeks   Status New   Target Date 11/07/16           PT Long Term Goals - 10/08/16 1046      PT LONG TERM GOAL #1   Title Pt will be independent with stretching/strengthening and balance HEP   Time 8   Period Weeks   Status New   Target Date 12/07/16     PT LONG TERM GOAL #2   Title Pt will report pain as < or = 3/10 on a daily basis   Time 8   Period Weeks   Status New   Target Date  12/07/16     PT LONG TERM GOAL #3   Title Pt will improve LE strength to 4-/5 overall RLE, 4+/5 overall LLE and improve five times sit to stand to < or = 12 seconds   Time 8   Period Weeks   Status New   Target Date 12/07/16     PT LONG TERM GOAL #4   Title Pt will decrease falls risk during gait in community as indicated by gait velocity >3.11ft/sec   Time 8   Period Weeks   Status New   Target Date 12/07/16     PT LONG TERM GOAL #5   Title Pt will improve Neuro QOL LE score by 15%   Baseline 29.2%   Time 8   Period Weeks   Status New   Target Date 12/07/16     Additional Long Term Goals   Additional Long Term Goals Yes     PT LONG TERM GOAL #6   Title Lumbar, SI, Hip ROM goals TBD at next visit            Plan - 10/11/16 0904    Clinical Impression Statement Pt returns today ambulating with RW; pt reports the pain, numbness and weakness in bilat LE is worse.  Discussed with pt therapist's concerns regarding new diagnosis and worsening symptoms.  PT and pt have both contacted neurology RN to report worsening symptoms. At this time it would not be safe to  proceed with therapy.  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  Will hold on PT until pt has f/u with neurosurgeon and is cleared medically to participate in therapy.  Pt fully agrees with decision.  Pt to contact PT after appointment with neurosurgeon.   Rehab Potential Good   Clinical Impairments Affecting Rehab Potential weakness and numbness have been progressive in nature, ankylosing spondylitis, unable to use convention pain management techniques of Estim or kinesiotape due to h/o CA   PT Frequency 2x / week   PT Duration 8 weeks   PT Treatment/Interventions ADLs/Self Care Home Management;Aquatic Therapy;Moist Heat;Traction;DME Instruction;Gait training;Stair training;Functional mobility training;Therapeutic activities;Therapeutic exercise;Balance training;Neuromuscular re-education;Patient/family education;Orthotic Fit/Training;Passive range of motion   PT Next Visit Plan Place patient on HOLD until seen by neurosurgeon and cleared medically to participate in therapy due to new diagnosis and progressively worsening symptoms   Consulted and Agree with Plan of Care Patient      Patient will benefit from skilled therapeutic intervention in order to improve the following deficits and impairments:  Pain, Decreased balance, Decreased range of motion, Decreased strength, Difficulty walking, Impaired flexibility, Impaired sensation  Visit Diagnosis: Chronic midline low back pain without sciatica  Muscle weakness (generalized)  Other disturbances of skin sensation  Other abnormalities of gait and mobility     Problem List Patient Active Problem List   Diagnosis Date Noted  . Hyperlipidemia 10/03/2016  . H/O prostate cancer 10/03/2016  . Hyponatremia 10/03/2016  . 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 Leg weakness 09/28/2016  . Hypertension   . Constipated   . Tachycardia 08/16/2016  . Hypertension,  essential 08/15/2016    Raylene Everts, PT, DPT 10/11/16    9:10 AM    [facility] [address] [place], [place], [address] Phone: [phone]   Fax:  [phone]  Name: Eduardo Burch MRN: [id number] Date of Birth: [birth year]

## 2016-10-11 NOTE — Progress Notes (Signed)
Cardiology Office Note    Date:  10/12/2016   ID:  Eduardo [place], DOB 1940-08-14, MRN [id number]  PCP:  Lavone Orn, MD  Cardiologist: Sinclair Grooms, MD   Chief Complaint  Patient presents with  . Follow-up    Hypertension    History of Present Illness:  Eduardo Burch is a 76 y.o. 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  He seems depressed. He has stopped drinking. He has bilateral lower extremity weakness. He denies pain. Appetite is decreased. Significant weight loss has occurred. We still haven't identified the reason that he has resting tachycardia.  Past Medical History:  Diagnosis Date  . Constipated   . Elevated blood pressure reading   . 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 Hypercholesterolemia   . Hypertension   . Spondylosis     Past Surgical History:  Procedure Laterality Date  . PROATATECTOMY      Current Medications: Outpatient Medications Prior to Visit  Medication Sig Dispense Refill  . acetaminophen (TYLENOL) 500 MG tablet Take 500-1,000 mg by mouth every 6 (six) hours as needed for headache.    Marland Kitchen aspirin 81 MG chewable tablet Chew 1 tablet (81 mg total) by mouth daily. 30 tablet 0  . docusate sodium (COLACE) 100 MG capsule Take 1 capsule (100 mg total) by mouth every 12 (twelve) hours. 60 capsule 0  . gabapentin (NEURONTIN) 300 MG capsule Take 300 mg by mouth at bedtime.    Marland Kitchen lisinopril (PRINIVIL,ZESTRIL) 10 MG tablet Take 1 tablet (10 mg total) by mouth daily. 30 tablet 0  . metoprolol succinate (TOPROL XL) 25 MG 24 hr tablet Take 1 tablet (25 mg total) by mouth daily. 90 tablet 3  . traMADol (ULTRAM) 50 MG tablet Take 1 tablet (50 mg total) by mouth every 6 (six) hours as needed for moderate pain or severe pain. 30 tablet 0  . UNABLE TO FIND OUTPATIENT PHYSICAL THERAPY AND OCCUPATIONAL THERAPY  Dx:  Guillain-Barre  Evaluation and Treat 1 each 0  . Glucosamine-Chondroit-Vit C-Mn (GLUCOSAMINE 1500 COMPLEX) CAPS Take 1 capsule by mouth daily.     . Multiple Vitamins-Minerals (ONE-A-DAY MENS 50+ ADVANTAGE) TABS Take 1 tablet by mouth daily.    . ondansetron (ZOFRAN ODT) 4 MG disintegrating tablet Take 1 tablet (4 mg total) by mouth every 6 (six) hours as needed for nausea or vomiting. (Patient not taking: Reported on 10/08/2016) 20 tablet 0  . simvastatin (ZOCOR) 40 MG tablet Take 40 mg by mouth daily.     No facility-administered medications prior to visit.      Allergies:   Remicade [infliximab]   Social History   Social History  . Marital status: Married    Spouse name: N/A  . Number of children: N/A  . Years of education: N/A   Social History Main Topics  . Smoking status: Former Smoker    Types: Cigarettes  . Smokeless tobacco: Never Used  . Alcohol use None  . Drug use: Unknown  . Sexual activity: Not Asked   Other Topics Concern  . None   Social History Narrative  . None     Family History:  The patient's family history includes Lung cancer in his mother; Spondylitis in his father.   ROS:   Please see the history of present illness.    Being evaluated at [facility] for leg weakness, weight loss, and anorexia. 7 difficulty with  balance and walking. Headaches since spinal tap earlier this year. Easy bruising, constipation, chills, and excessive sweating.  All other systems reviewed and are negative.   PHYSICAL EXAM:   VS:  BP 138/70   Pulse (!) 103   Ht 6' (1.829 m)   Wt 164 lb 12.8 oz (74.8 kg)   SpO2 95%   BMI 22.35 kg/m    GEN: Slender with significant weight loss. HEENT: normal  Neck: no JVD, carotid bruits, or masses Cardiac: RRR; no murmurs, rubs, or gallops,no edema  Respiratory:  clear to auscultation bilaterally, normal work of breathing GI: soft, nontender, nondistended, + BS MS: no deformity or atrophy  Skin: warm and dry, no rash Neuro:   Alert and Oriented x 3, Strength and sensation are intact Psych: euthymic mood, full affect  Wt Readings from Last 3 Encounters:  10/12/16 164 lb 12.8 oz (74.8 kg)  10/03/16 180 lb 12.8 oz (82 kg)  08/16/16 197 lb 1.9 oz (89.4 kg)      Studies/Labs Reviewed:   EKG:  EKG  Not repeated  Recent Labs: 08/16/2016: TSH 2.530 10/03/2016: ALT 46; BUN 20; Creatinine, Ser 0.61; Hemoglobin 14.7; Magnesium 2.1; Platelets 352; Potassium 4.4; Sodium 134   Lipid Panel No results found for: CHOL, TRIG, HDL, CHOLHDL, VLDL, LDLCALC, LDLDIRECT  Additional studies/ records that were reviewed today include:  Echocardiogram performed in June 2018 demonstrated moderate left ventricular hypertrophy. Also noted to have mild to moderate aortic regurgitation.    ASSESSMENT:    1. Hypertension, essential   2. Other hyperlipidemia   3. Neuromuscular disorder ([place])   4. Tachycardia   5. Nonrheumatic aortic valve insufficiency      PLAN:  In order of problems listed above:  1. The blood pressure is under very reasonable control on current low doses of lisinopril and metoprolol succinate. No change in therapy at this time. We'll likely be able to consolidate therapy at some point in the future. 2. Not addressed 3. In the process of being evaluated, now at [facility]. 4. Still with tachycardia but not as rapid as on prior visit with the addition of metoprolol. The increase in heart rate may in some way be related to the neuromuscular disorder and perhaps some associated autonomic neuropathy/dysfunction. 5. Mild to moderate aortic regurgitation noted on echo may be related to valvulitis associated with ankylosing spondylitis.  No different or further cardiac evaluation at this time. Clinical follow-up in 9-12 months. Consider repeat echo at some point in the future to reassess left ventricular hypertrophy and aortic valve regurgitation.    Medication Adjustments/Labs and Tests  Ordered: Current medicines are reviewed at length with the patient today.  Concerns regarding medicines are outlined above.  Medication changes, Labs and Tests ordered today are listed in the Patient Instructions below. Patient Instructions  Medication Instructions:  None  Labwork: None  Testing/Procedures: None  Follow-Up: Your physician wants you to follow-up in: 9-12 months with Dr. Tamala Julian.  You will receive a reminder letter in the mail two months in advance. If you don't receive a letter, please call our office to schedule the follow-up appointment.   Any Other Special Instructions Will Be Listed Below (If Applicable).     If you need a refill on your cardiac medications before your next appointment, please call your pharmacy.      Signed, Sinclair Grooms, MD  10/12/2016 8:29 AM    [facility] [place], [place]  [address] Phone: [phone]; Fax: [phone]

## 2016-10-12 ENCOUNTER — Ambulatory Visit (INDEPENDENT_AMBULATORY_CARE_PROVIDER_SITE_OTHER): Payer: Medicare Other | Admitting: Interventional Cardiology

## 2016-10-12 ENCOUNTER — Encounter: Payer: Self-pay | Admitting: Interventional Cardiology

## 2016-10-12 VITALS — BP 138/70 | HR 103 | Ht 72.0 in | Wt 164.8 lb

## 2016-10-12 DIAGNOSIS — I351 Nonrheumatic aortic (valve) insufficiency: Secondary | ICD-10-CM | POA: Diagnosis not present

## 2016-10-12 DIAGNOSIS — I10 Essential (primary) hypertension: Secondary | ICD-10-CM

## 2016-10-12 DIAGNOSIS — R000 Tachycardia, unspecified: Secondary | ICD-10-CM

## 2016-10-12 DIAGNOSIS — E7849 Other hyperlipidemia: Secondary | ICD-10-CM

## 2016-10-12 DIAGNOSIS — E784 Other hyperlipidemia: Secondary | ICD-10-CM | POA: Diagnosis not present

## 2016-10-12 DIAGNOSIS — G709 Myoneural disorder, unspecified: Secondary | ICD-10-CM

## 2016-10-12 NOTE — Patient Instructions (Addendum)
Medication Instructions:  None  Labwork: None  Testing/Procedures: None  Follow-Up: Your physician wants you to follow-up in: 9-12 months with Dr. Smith.  You will receive a reminder letter in the mail two months in advance. If you don't receive a letter, please call our office to schedule the follow-up appointment.   Any Other Special Instructions Will Be Listed Below (If Applicable).     If you need a refill on your cardiac medications before your next appointment, please call your pharmacy.   

## 2016-10-23 ENCOUNTER — Ambulatory Visit: Payer: Medicare Other | Admitting: Physical Therapy

## 2016-10-24 DIAGNOSIS — M889 Osteitis deformans of unspecified bone: Secondary | ICD-10-CM | POA: Insufficient documentation

## 2016-10-25 DIAGNOSIS — M5489 Other dorsalgia: Secondary | ICD-10-CM | POA: Insufficient documentation

## 2016-10-26 ENCOUNTER — Ambulatory Visit: Payer: Medicare Other | Admitting: Physical Therapy

## 2016-10-26 LAB — FUNGUS CULTURE RESULT

## 2016-10-26 LAB — FUNGUS CULTURE WITH STAIN

## 2016-10-26 LAB — FUNGAL ORGANISM REFLEX

## 2016-10-29 ENCOUNTER — Ambulatory Visit: Payer: Medicare Other | Admitting: Physical Therapy

## 2016-10-29 ENCOUNTER — Encounter: Payer: Self-pay | Admitting: Physical Therapy

## 2016-10-29 ENCOUNTER — Telehealth: Payer: Self-pay | Admitting: Interventional Cardiology

## 2016-10-29 ENCOUNTER — Other Ambulatory Visit: Payer: Self-pay | Admitting: Interventional Cardiology

## 2016-10-29 DIAGNOSIS — R208 Other disturbances of skin sensation: Secondary | ICD-10-CM

## 2016-10-29 DIAGNOSIS — M545 Low back pain, unspecified: Secondary | ICD-10-CM

## 2016-10-29 DIAGNOSIS — G8929 Other chronic pain: Secondary | ICD-10-CM

## 2016-10-29 DIAGNOSIS — M6281 Muscle weakness (generalized): Secondary | ICD-10-CM

## 2016-10-29 DIAGNOSIS — R2689 Other abnormalities of gait and mobility: Secondary | ICD-10-CM

## 2016-10-29 MED ORDER — LISINOPRIL 10 MG PO TABS: 10.0000 mg | ORAL_TABLET | Freq: Every day | ORAL | 3 refills | Status: DC

## 2016-10-29 NOTE — Patient Instructions (Signed)
Balancing Act    In a standing position, go up on toes and down. Then back on heels. For balance, use support or put arms out in front. Repeat __8__ times. Do __2__ sessions per day.  FLEXION: Standing - Stable (Active)    Stand, both feet flat. Bend right knee, bringing heel toward buttocks.  Complete _8__ repetitions EACH SIDE GOING SLOW. Perform _2__ sessions per day.   Standing Hip Abduction    While standing, raise your leg out to the side. Keep your knee straight and maintain your toes pointed forward the entire time.  Repeat 8 times EACH SIDE.  2 sessions per day.  Use your arms for support for balance and safety.  Mini Squat: Double Leg HOLDING ONTO SINK    With feet shoulder width apart, HOLDING SINK-do a mini squat. Keep knees in line with second toe. Knees do not go past toes. Repeat _8__ times per set.    [place]: Double Arm    Arms near sides, palms up, HOLDING 1 LB WEIGHT. Press both arms lightly into BED, slide arms out to side and up alongside head. Keep contact with floor throughout motion. At maximal position, lengthen arms. Hold _2__ seconds. Relax. Slide arms back to start. Repeat _8__ times.  Flexion: Biceps Curl    SITTING Hold __1__ lb weight in front of thighs, palms forward. With stomach tight, curl up, bending elbows. Repeat __8__ times per arm. . Do __2__ sessions per day.   Elbow Extension - Back (Triceps Strength)    Using _1__ lb weights, sit leaning slightly forward, elbows slightly bent. Breathe in. Slowly straighten arms back, breathing out through pursed lips. Return slowly, breathing in. Repeat __10_ times per arm. Do __2_ sessions per day     IF 1 LB IS TOO LIGHT FOR RIGHT ARM-USE 3 LB.

## 2016-10-29 NOTE — Therapy (Signed)
[facility] [address] [place], [place], [address] Phone: [phone]   Fax:  [phone]  Physical Therapy RE-Evaluation  Patient Details  Name: Eduardo Burch MRN: [id number] Date of Birth: [birth year] Referring Provider: Margaret Pyle, [place]  Encounter Date: 10/29/2016      PT End of Session - 10/29/16 1641    Visit Number 1   Number of Visits 17   Date for PT Re-Evaluation 12/28/16  per re-evaluation   [place] code and 10th visit PN   PT Start Time 1532   PT Stop Time 1620   PT Time Calculation (min) 48 min   Activity Tolerance Patient tolerated treatment well   Behavior During Therapy [facility] for tasks assessed/performed      Past Medical History:  Diagnosis Date  . Constipated   . Elevated blood pressure reading   . 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 Hypercholesterolemia   . Hypertension   . Spondylosis     Past Surgical History:  Procedure Laterality Date  . PROATATECTOMY      There were no vitals filed for this visit.       Subjective Assessment - 10/29/16 1535    Subjective Pt returns to PT after initial evaluation and assessment at [facility].  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  [facility] to review bone scans before they perform biopsies.  Pain is better controlled now with medication.   Patient is accompained by: Family member   Pertinent History prostate CA with prostatectomy-NO ESTIM or kinesiotape; akylosing spondylitis, HTN-assess vitals each visit, Paget's disease   Limitations Standing;Walking;Sitting   How long can you sit comfortably? 10 minutes   How long can you stand comfortably? 10 minutes   How long can you walk comfortably? 10 minutes   Diagnostic tests X rays revealed Paget's disease treated with Reclast,  MRI, EMG   Patient Stated Goals Back to normal   Currently in Pain? Yes   Pain Score 4    Pain Location Back   Pain Orientation Lower   Pain Descriptors / Indicators Aching;Dull   Pain Type Chronic pain            [facility] PT Assessment - 10/29/16 1543      Assessment   Medical Diagnosis Paget's disease, LE weakness   Referring Provider Margaret Pyle, [place]   Onset Date/Surgical Date 08/20/16     Precautions   Precautions Back;Other (comment)   Precaution Comments h/o prostate CA with prostatectomy (NO ESTIM OR KINESIOTAPE), ankylosing spondylitis, HTN-MONITOR VITALS AT [facility] VISIT     Balance Screen   Has the patient fallen in the past 6 months No   Has the patient had a decrease in activity level because of a fear of falling?  Yes   Is the patient reluctant to leave their home because of a fear of falling?  Yes     [place] Private residence   Living Arrangements Spouse/significant other   Type of [place] to enter   Entrance Stairs-Number of Steps 5   Entrance Stairs-Rails Right   Home Layout Two level   Alternate Level Stairs-Number of Steps 10   Alternate [place] - 2 wheels;Cane - single point  Additional Comments has lost 30 lbs in 7 weeks     Prior Function   Level of Independence Independent   Leisure working out at the gym     Observation/Other Assessments   Focus on Therapeutic Outcomes (FOTO)  12 (88% limited; predicted 57% limited)   Other Surveys  Other Surveys   Neuro Quality of Life  LE: 29.2%     Sensation   Light Touch Impaired by gross assessment   Hot/Cold Appears Intact   Additional Comments numbness proximally in gluteal and groin region; no changes in bowel or bladder function     Coordination   Gross Motor Movements are Fluid and Coordinated No   Heel Shin Test limited by weakness, only able to perform 1/4-1/2 movement     ROM / Strength    AROM / PROM / Strength Strength     PROM   PROM Assessment Site Hip   Right/Left Hip Right;Left   Right Hip Internal Rotation  --  limited   Left Hip Flexion 45-46 deg with knee straight due to hamstring tightness   Left Hip Internal Rotation  --  limited     Strength   Overall Strength Deficits   Overall Strength Comments RLE: 1/5 hip flexion, 3/5 knee flexion and extension, ankle DF; LLE: 2/5 hip flexion, 3+/5 knee flexion/extension and ankle DF.  LUE demonstrates 3-/5 shoulder flexion/ABD, 3+/5 elbow flexion/ext, wrist flex/ext, grasp; RUE 4/5 overall     Right Hip   Right Hip Flexion 35  with knee straight due to hamstring tightness     Palpation   SI assessment  no asymmetry/rotation; no pain with pelvic compression or gaping     Bed Mobility   Bed Mobility Supine to Sit;Sit to Supine   Supine to Sit 4: Min assist   Sit to Supine 3: Mod assist     Transfers   Transfers Stand Pivot Transfers   Stand Pivot Transfers 3: Mod assist;4: Min assist   Stand Pivot Transfer Details (indicate cue type and reason) min A to stand from regular chair, mod A to stand from lower surfaces     Ambulation/Gait   Ambulation Distance (Feet) 100 Feet   Assistive device Rollator   Gait Pattern Step-through pattern;Decreased stride length;Decreased hip/knee flexion - right;Decreased dorsiflexion - right;Right flexed knee in stance;Scissoring;Antalgic;Narrow base of support;Trunk flexed;Poor foot clearance - right   Ambulation Surface Level;Indoor     Standardized Balance Assessment   Standardized Balance Assessment Five Times Sit to Stand   Five times sit to stand comments  25 seconds from mat to perform 2 reps            Objective measurements completed on examination: See above findings.                  PT Education - 10/29/16 1641    Education provided Yes   Education Details clinical findings, PT POC, goals and initial HEP   Person(s) Educated Patient;Spouse    Methods Explanation;Demonstration;Handout   Comprehension Verbalized understanding;Returned demonstration          PT Short Term Goals - 10/29/16 1650      PT SHORT TERM GOAL #1   Title Pt will demonstrate independence with initial stretching and strengthening HEP   Time 4   Period Weeks   Status Revised   Target Date 11/28/16     PT SHORT TERM GOAL #2   Title Pt will report decreased pain to < or = 3/10 on  a daily basis   Baseline 4   Time 4   Period Weeks   Status Revised   Target Date 11/28/16     PT SHORT TERM GOAL #3   Title Pt will improve functional LE strength to perform all 5 reps of five times sit to stand score to <30 seconds   Baseline 25 seconds to perform 2 from mat   Time 4   Period Weeks   Status Revised   Target Date 11/28/16     PT SHORT TERM GOAL #4   Title Pt will decrease falls risk during gait in community as indicated by increase in gait velocity to >2.9 ft/sec   Baseline --   Time 4   Period Weeks   Status Revised     PT SHORT TERM GOAL #5   Title Pt will ambulate x 300' over indoor and outdoor surfaces including ramp, curb, pavement with LRAD and negotiate 10 stairs with one rail and supervision with minimal foot drag and no LOB   Baseline --   Time 4   Period Weeks   Status Revised   Target Date 11/28/16           PT Long Term Goals - 10/29/16 1655      PT LONG TERM GOAL #1   Title Pt will be independent with stretching/strengthening and balance HEP   Time 8   Period Weeks   Status Revised   Target Date 12/28/16     PT LONG TERM GOAL #2   Title Pt will report pain as < or = 2/10 on a daily basis   Time 8   Period Weeks   Status Revised   Target Date 12/28/16     PT LONG TERM GOAL #3   Title Pt will improve LE strength to 3+/5 overall RLE, 4+/5 overall LLE and improve five times sit to stand to < or = 20 seconds   Time 8   Period Weeks   Status Revised   Target Date 12/28/16     PT LONG TERM GOAL #4   Title Pt will  decrease falls risk during gait in community as indicated by gait velocity >3.82ft/sec   Time 8   Period Weeks   Status New   Target Date 12/28/16     PT LONG TERM GOAL #5   Title Pt will improve Neuro QOL LE score by 15%   Baseline 29.2%   Time 8   Period Weeks   Status New   Target Date 12/28/16     PT LONG TERM GOAL #6   Title Pt will improve hamstring ROM to 45-50 deg bilaterally to decrease mm tension on low back   Time 8   Period Weeks   Status New   Target Date 12/28/16                Plan - 10/29/16 1642    Clinical Impression Statement Pt is 76 y/o male who returns to [place] with new PT referral and clearance to participate in therapy for LE strengthening due to Paget's Disease.  Pt will continue work up for other pathology including metastatic disease.  Pt seen for re-evaluation today and presents with: continued impaired sensation, impaired strength in bilat LE R > LLE weakness, impaired LE ROM, back pain, impaired balance, gait and impaired endurance/activity tolerance.  Pt does present with new UE weakness and may benefit from OT referral.  Pt's weakness has progressed and pt must now use  rollator for safe ambulation as cane is insufficient.  Pt was unable to complete 5 times sit to stand and was only able to stand x 2 reps in 25 seconds.  Pt would benefit from PT services to address these impairments to maximize functional mobility independence, prevent further deconditioning and decrease falls risk.     History and Personal Factors relevant to plan of care: independent prior to 7 weeks ago, now must ambulate with rollator for safe ambulation, h/o prostate CA, ankylosing spondylitis, progressive numbness, weakness, pain, HTN, continued medical work up for other pathology including metastatic disease   Clinical Presentation Unstable   Clinical Presentation due to: independent prior to 7 weeks ago, now must ambulate with rollator for safe ambulation, h/o prostate CA,  ankylosing spondylitis, progressive numbness, weakness, pain, HTN, continued medical work up for other pathology including metastatic disease   Clinical Decision Making High   Rehab Potential Good   Clinical Impairments Affecting Rehab Potential weakness and numbness have been progressive in nature, ankylosing spondylitis, unable to use conventional pain management techniques of Estim or kinesiotape due to h/o CA, undergoing medical work up for metastatic disease   PT Frequency 2x / week   PT Duration 8 weeks   PT Treatment/Interventions ADLs/Self Care Home Management;Aquatic Therapy;Moist Heat;Traction;DME Instruction;Gait training;Stair training;Functional mobility training;Therapeutic activities;Therapeutic exercise;Balance training;Neuromuscular re-education;Patient/family education;Orthotic Fit/Training;Passive range of motion;Energy conservation   PT Next Visit Plan MONITOR VITALS AT [facility] VISIT!  re-do 5 times sit to stand from chair; re-assess gait velocity; add LE stretching to HEP: hamstring, ER mm.  Review stair negotiation   Recommended Other Services OT referral due to new UE weakness   Consulted and Agree with Plan of Care Patient;Family member/caregiver   Family Member Consulted wife      Patient will benefit from skilled therapeutic intervention in order to improve the following deficits and impairments:  Pain, Decreased balance, Decreased range of motion, Decreased strength, Difficulty walking, Impaired flexibility, Impaired sensation, Abnormal gait, Decreased activity tolerance, Decreased coordination, Decreased endurance, Decreased mobility, Increased muscle spasms  Visit Diagnosis: Chronic midline low back pain without sciatica  Muscle weakness (generalized)  Other disturbances of skin sensation  Other abnormalities of gait and mobility      G-Codes - 2016-11-06 1700    Functional Assessment Tool Used (Outpatient Only) Neuro QOL, five times sit to stand   Functional  Limitation Mobility: Walking and moving around   Mobility: Walking and Moving Around Current Status [phone]) At least 60 percent but less than 80 percent impaired, limited or restricted   Mobility: Walking and Moving Around Goal Status [phone]) At least 20 percent but less than 40 percent impaired, limited or restricted       Problem List Patient Active Problem List   Diagnosis Date Noted  . Back pain without sciatica 10/25/2016  . 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 Hyperlipidemia 10/03/2016  . H/O prostate cancer 10/03/2016  . Hyponatremia 10/03/2016  . Guillain Barr syndrome ([place]) 10/03/2016  . Leg weakness 09/28/2016  . Constipated   . Tachycardia 08/16/2016  . Hypertension, essential 08/15/2016    Raylene Everts, PT, DPT 11-06-16    5:06 PM    [facility] [address] [place], [place], [address] Phone: [phone]   Fax:  [phone]  Name: Cheron Pasquarelli MRN: [id number] Date of Birth: [birth year]

## 2016-10-29 NOTE — Telephone Encounter (Signed)
°*  STAT* If patient is at the pharmacy, call can be transferred to refill team.   1. Which medications need to be refilled? (please list name of each medication and dose if known) Lisinopril 10 mg  2. Which pharmacy/location (including street and city if local pharmacy) is medication to be sent to? [facility] [address] at [place]   3. Do they need a 30 day or 90 day supply? [place]

## 2016-10-30 NOTE — Telephone Encounter (Signed)
Medication Detail    Disp Refills Start End   lisinopril (PRINIVIL,ZESTRIL) 10 MG tablet 90 tablet 3 10/29/2016    Sig - Route: Take 1 tablet (10 mg total) by mouth daily. - Oral   Sent to pharmacy as: lisinopril (PRINIVIL,ZESTRIL) 10 MG tablet   E-Prescribing Status: Receipt confirmed by pharmacy (10/29/2016 10:57 AM EDT)   Pharmacy   [id number] - [place], [place]

## 2016-11-02 ENCOUNTER — Ambulatory Visit: Payer: Medicare Other | Admitting: Physical Therapy

## 2016-11-02 ENCOUNTER — Encounter: Payer: Self-pay | Admitting: Physical Therapy

## 2016-11-02 ENCOUNTER — Telehealth: Payer: Self-pay | Admitting: Interventional Cardiology

## 2016-11-02 DIAGNOSIS — R208 Other disturbances of skin sensation: Secondary | ICD-10-CM

## 2016-11-02 DIAGNOSIS — M545 Low back pain: Secondary | ICD-10-CM | POA: Diagnosis not present

## 2016-11-02 DIAGNOSIS — M6281 Muscle weakness (generalized): Secondary | ICD-10-CM

## 2016-11-02 DIAGNOSIS — R2689 Other abnormalities of gait and mobility: Secondary | ICD-10-CM

## 2016-11-02 NOTE — Telephone Encounter (Signed)
Spoke with the patient's wife.  Patient took his BP meds at 8:30 am today- lisinopril 10 mg & metoprolol succ 25 mg. He went to PT at 10:15 am and felt dizzy getting out of the car. He felt dizzy prior to leaving so orthostatic BP's were checked: Lying- 99/75 (82 bpm) Sitting- 85/60 (98 bpm) Standing- 70/60 (99 bpm)   No baseline BP was taken this morning. The patient is in PT for Padget's disease. Per his wife, the patient is reporting less pain lately.  He has been drinking the same amount of fluids although she is unsure how much he has had this morning.  Reviewed with Dr. Tamala Julian- he encourages the patient to push fluids today. He should monitor his BP and if he is < 110/90 in the morning, then he should hold lisinopril but take his metoprolol.   The patient's wife is aware of the above recommendations from Dr. Tamala Julian. She will also monitor his BP the rest of the weekend.  I have advised her if any further concerns over the weekend to please call the office and she will be directed to the physician on call.  She is agreeable and voices understanding.

## 2016-11-02 NOTE — Therapy (Signed)
[facility] [address] [place], [place], [address] Phone: [phone]   Fax:  [phone]  Physical Therapy Treatment  Patient Details  Name: Eduardo Burch MRN: [id number] Date of Birth: Dec 27, 1940 Referring Provider: Margaret Pyle, [place]  Encounter Date: 11/02/2016      PT End of Session - 11/02/16 1129    Visit Number 2   Number of Visits 17   Date for PT Re-Evaluation 12/28/16  per re-evaluation   [place] code and 10th visit PN   PT Start Time 1025   PT Stop Time 1117   PT Time Calculation (min) 52 min   Activity Tolerance Treatment limited secondary to medical complications (Comment)   Behavior During Therapy [facility] for tasks assessed/performed      Past Medical History:  Diagnosis Date  . Constipated   . Elevated blood pressure reading   . 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 Hypercholesterolemia   . Hypertension   . Spondylosis     Past Surgical History:  Procedure Laterality Date  . PROATATECTOMY      There were no vitals filed for this visit.      Subjective Assessment - 11/02/16 1028    Subjective Pain is down today; is having significant dizziness-having to use clinic w/c today to prevent a fall.     Patient is accompained by: Family member   Pertinent History prostate CA with prostatectomy-NO ESTIM or kinesiotape; akylosing spondylitis, HTN-assess vitals each visit, Paget's disease   Limitations Standing;Walking;Sitting   Currently in Pain? Yes                Vestibular Assessment - 11/02/16 1033      Vestibular Assessment   General Observation Sitting in w/c, unsafe to ambulate with rollator     Symptom Behavior   Type of Dizziness "World moves"  wavy   Frequency of Dizziness today   Duration of Dizziness constant  hot sweats   Aggravating Factors Spontaneous onset   Relieving Factors No known relieving  factors     Orthostatics   BP supine (x 5 minutes) 99/75   HR supine (x 5 minutes) 82   BP sitting 85/60   HR sitting 99   BP standing (after 1 minute) 70/60  began to feel sweaty   HR standing (after 1 minute) 96   BP standing (after 3 minutes) --  unable to remain standing for that long   Orthostatics Comment Wife contacted MD; pt placed in supine with LE elevated above heart                         PT Education - 11/02/16 1107    Education provided Yes   Education Details Handout on paget's disease, hypotension   Person(s) Educated Patient;Spouse   Methods Explanation;Handout   Comprehension Verbalized understanding          PT Short Term Goals - 10/29/16 1650      PT SHORT TERM GOAL #1   Title Pt will demonstrate independence with initial stretching and strengthening HEP   Time 4   Period Weeks   Status Revised   Target Date 11/28/16     PT SHORT TERM GOAL #2   Title Pt will report decreased pain to < or = 3/10 on a daily basis   Baseline 4   Time 4   Period Weeks   Status  Revised   Target Date 11/28/16     PT SHORT TERM GOAL #3   Title Pt will improve functional LE strength to perform all 5 reps of five times sit to stand score to <30 seconds   Baseline 25 seconds to perform 2 from mat   Time 4   Period Weeks   Status Revised   Target Date 11/28/16     PT SHORT TERM GOAL #4   Title Pt will decrease falls risk during gait in community as indicated by increase in gait velocity to >2.9 ft/sec   Baseline --   Time 4   Period Weeks   Status Revised     PT SHORT TERM GOAL #5   Title Pt will ambulate x 300' over indoor and outdoor surfaces including ramp, curb, pavement with LRAD and negotiate 10 stairs with one rail and supervision with minimal foot drag and no LOB   Baseline --   Time 4   Period Weeks   Status Revised   Target Date 11/28/16           PT Long Term Goals - 10/29/16 1655      PT LONG TERM GOAL #1   Title Pt will  be independent with stretching/strengthening and balance HEP   Time 8   Period Weeks   Status Revised   Target Date 12/28/16     PT LONG TERM GOAL #2   Title Pt will report pain as < or = 2/10 on a daily basis   Time 8   Period Weeks   Status Revised   Target Date 12/28/16     PT LONG TERM GOAL #3   Title Pt will improve LE strength to 3+/5 overall RLE, 4+/5 overall LLE and improve five times sit to stand to < or = 20 seconds   Time 8   Period Weeks   Status Revised   Target Date 12/28/16     PT LONG TERM GOAL #4   Title Pt will decrease falls risk during gait in community as indicated by gait velocity >3.17ft/sec   Time 8   Period Weeks   Status New   Target Date 12/28/16     PT LONG TERM GOAL #5   Title Pt will improve Neuro QOL LE score by 15%   Baseline 29.2%   Time 8   Period Weeks   Status New   Target Date 12/28/16     PT LONG TERM GOAL #6   Title Pt will improve hamstring ROM to 45-50 deg bilaterally to decrease mm tension on low back   Time 8   Period Weeks   Status New   Target Date 12/28/16               Plan - 11/02/16 1130    Clinical Impression Statement Pt presents today with c/o dizziness.  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  Due to symptoms initiated vestibular assessment with orthostatics assessment.  Pt noted to have significant drop in BP between positions and increase in HR with symptoms of diaphoresis and lightheadedness.  Due to significant drop in BP pt placed in supine with LE elevated above heart level x 10 minutes and then returned to sitting to sip on water.  While in supine provided pt and wife with a handout on Paget's disease.  Pt's symptoms did begin to improved and pt able to transfer mat > w/c >  truck with min-mod A.  Wife to contact MD for further assessment.  Will continue to assess and progress as pt tolerates.   Rehab Potential Good   Clinical Impairments Affecting Rehab  Potential weakness and numbness have been progressive in nature, ankylosing spondylitis, unable to use conventional pain management techniques of Estim or kinesiotape due to h/o CA, undergoing medical work up for metastatic disease   PT Frequency 2x / week   PT Duration 8 weeks   PT Treatment/Interventions ADLs/Self Care Home Management;Aquatic Therapy;Moist Heat;Traction;DME Instruction;Gait training;Stair training;Functional mobility training;Therapeutic activities;Therapeutic exercise;Balance training;Neuromuscular re-education;Patient/family education;Orthotic Fit/Training;Passive range of motion;Energy conservation   PT Next Visit Plan MONITOR VITALS AT [facility] VISIT!  re-do 5 times sit to stand from chair; re-assess gait velocity; add LE stretching to HEP: hamstring, ER mm.  Review stair negotiation   Recommended Other Services OT referral?   Consulted and Agree with Plan of Care Patient;Family member/caregiver   Family Member Consulted wife      Patient will benefit from skilled therapeutic intervention in order to improve the following deficits and impairments:  Pain, Decreased balance, Decreased range of motion, Decreased strength, Difficulty walking, Impaired flexibility, Impaired sensation, Abnormal gait, Decreased activity tolerance, Decreased coordination, Decreased endurance, Decreased mobility, Increased muscle spasms  Visit Diagnosis: Muscle weakness (generalized)  Other disturbances of skin sensation  Other abnormalities of gait and mobility     Problem List Patient Active Problem List   Diagnosis Date Noted  . Back pain without sciatica 10/25/2016  . 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 Hyperlipidemia 10/03/2016  . H/O prostate cancer 10/03/2016  . Hyponatremia 10/03/2016  . Guillain Barr syndrome ([place]) 10/03/2016  . Leg weakness 09/28/2016  . Constipated   . Tachycardia 08/16/2016  . Hypertension, essential 08/15/2016    Raylene Everts, PT, DPT 11/02/16    11:39  AM    [facility] [address] [place], [place], [address] Phone: [phone]   Fax:  [phone]  Name: Eduardo Burch MRN: [id number] Date of Birth: 1940/05/15

## 2016-11-02 NOTE — Patient Instructions (Signed)
Paget Disease of Bone Paget disease is a condition that makes the bones grow faster than normal. This leads to having bones that are larger and weaker than normal. Healthy bones rebuild themselves by destroying old bone and replacing it with new bone tissue. This normal process usually slows down as a person gets older. If you have Paget disease, the process speeds up instead. As a result, your bones become weaker and larger. The new bone tissue may have more blood vessels. These changes can cause the bone to have an abnormal shape (deformity). These bones may bend or break more easily than healthy bones. Paget disease may affect just a few bones or bones all over the body. Bones in the arms, legs, spine, pelvis, and skull are often affected. What are the causes? The cause of this condition is not known. What increases the risk? This condition is more likely to develop in:  People who have a family history of the disease.  People who are age 31 or older.  Males.  People of European descent.  What are the signs or symptoms? Symptoms of this condition include:  Bone pain.  Neck pain.  Headache.  Joint pain or stiffness.  Tingling or numbness.  Bowed legs.  Bones that break easily.  A feeling of warmth in areas of skin that are over the affected bone.  Loss of height.  Changes in the shape of the skull or other bones.  Hearing loss, if the bones of the skull are affected.  In some cases, there are no symptoms. How is this diagnosed? This condition may be diagnosed based on:  A physical exam and medical history.  Blood and urine tests to check whether the levels of calcium and alkaline phosphatase are higher than normal. These substances may increase with abnormal bone growth.  Imaging studies, such as X-rays and bone scans.  How is this treated? Treatment for this condition depends on symptoms and the area of the body that is affected. If you have no symptoms, you may  not need any treatment. You may get treatment if the condition is causing symptoms, affecting your skull, or putting you at risk for a bone fracture. The goals of treatment are to relieve bone pain and to prevent the condition from getting worse. Treatment may include:  Medicines, such as: ? NSAIDs for pain. ? Bisphosphonates to slow down bone growth and reduce pain. You may have to take this medicine for several months. It is usually taken by mouth but can also be received through an IV tube. ? A chemical messenger (hormone) that is called calcitonin. This hormone slows down bone activity. It is given as an injection under the skin.  Surgery to treat problems that are caused by the disease. Surgery is rarely used.  Follow these instructions at home:  Take medicines only as directed by your health care provider.  If you are taking a bisphosphonate by mouth, make sure to: ? Take the medicine with a large glass of water. ? Take it in the morning. ? Wait 30 minutes to eat or drink anything after taking the medicine. ? Stay upright for 30 minutes after taking the medicine.  Keep all follow-up visits as directed by your health care provider. This is important. Contact a health care provider if:  You develop bone pain or joint pain.  Your bone pain or joint pain gets worse.  You have a hearing loss.  You have swelling in your legs or ankles. Get  help right away if:  You think that you have a broken bone.  You have shortness of breath or you have trouble breathing.  You have chest pain. This information is not intended to replace advice given to you by your health care provider. Make sure you discuss any questions you have with your health care provider. Document Released: 08/11/2001 Document Revised: 07/28/2015 Document Reviewed: 02/17/2014 Elsevier Interactive Patient Education  [employer].

## 2016-11-02 NOTE — Telephone Encounter (Signed)
New message     Pt is at physical therapy and pt is feeling dizzy       Pt c/o BP issue: STAT if pt c/o blurred vision, one-sided weakness or slurred speech  1. What are your last 5 BP readings?  bp lying down is 99/75 sitting 85/60  2. Are you having any other symptoms (ex. Dizziness, headache, blurred vision, passed out)?  dizziness  3. What is your BP issue?  Dizziness

## 2016-11-06 ENCOUNTER — Other Ambulatory Visit: Payer: Self-pay | Admitting: Otolaryngology

## 2016-11-06 DIAGNOSIS — R4702 Dysphasia: Secondary | ICD-10-CM

## 2016-11-06 DIAGNOSIS — R634 Abnormal weight loss: Secondary | ICD-10-CM

## 2016-11-06 DIAGNOSIS — R490 Dysphonia: Secondary | ICD-10-CM

## 2016-11-07 ENCOUNTER — Ambulatory Visit: Payer: Medicare Other | Attending: Internal Medicine | Admitting: Physical Therapy

## 2016-11-07 ENCOUNTER — Encounter: Payer: Self-pay | Admitting: Physical Therapy

## 2016-11-07 ENCOUNTER — Telehealth: Payer: Self-pay | Admitting: Interventional Cardiology

## 2016-11-07 VITALS — BP 88/62 | HR 104

## 2016-11-07 DIAGNOSIS — M6281 Muscle weakness (generalized): Secondary | ICD-10-CM | POA: Insufficient documentation

## 2016-11-07 DIAGNOSIS — R2689 Other abnormalities of gait and mobility: Secondary | ICD-10-CM | POA: Insufficient documentation

## 2016-11-07 DIAGNOSIS — G8929 Other chronic pain: Secondary | ICD-10-CM | POA: Insufficient documentation

## 2016-11-07 DIAGNOSIS — R208 Other disturbances of skin sensation: Secondary | ICD-10-CM | POA: Insufficient documentation

## 2016-11-07 DIAGNOSIS — M545 Low back pain: Secondary | ICD-10-CM | POA: Diagnosis present

## 2016-11-07 NOTE — Therapy (Signed)
[facility] [address] [place], [place], [address] Phone: [phone]   Fax:  [phone]  Physical Therapy Treatment  Patient Details  Name: Eduardo Burch MRN: [id number] Date of Birth: 05-19-1940 Referring Provider: Margaret Pyle, [place]  Encounter Date: 11/07/2016      PT End of Session - 11/07/16 2106    Visit Number 3   Number of Visits 17   Date for PT Re-Evaluation 12/28/16  per re-evaluation   [place] code and 10th visit PN   PT Start Time [phone]   PT Stop Time 0934   PT Time Calculation (min) 47 min   Activity Tolerance Patient tolerated treatment well   Behavior During Therapy [facility] for tasks assessed/performed      Past Medical History:  Diagnosis Date  . Constipated   . Elevated blood pressure reading   . 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 Hypercholesterolemia   . Hypertension   . Spondylosis     Past Surgical History:  Procedure Laterality Date  . PROATATECTOMY      Vitals:   11/07/16 0850  BP: (!) 88/62  Pulse: (!) 104  SpO2: 99%        Subjective Assessment - 11/07/16 0850    Subjective Pt feeling better today, still having intermittent lightheadedness but it goes away quickly.  Pt ambulating with rollator today.  Pt has been released from [facility] and referred back to Neurologist to continue to treat Paget's disease.     Patient is accompained by: Family member   Pertinent History prostate CA with prostatectomy-NO ESTIM or kinesiotape; akylosing spondylitis, HTN-assess vitals each visit, Paget's disease   Limitations Standing;Walking;Sitting   How long can you sit comfortably? 10 minutes   How long can you stand comfortably? 10 minutes   How long can you walk comfortably? 10 minutes   Diagnostic tests X rays revealed Paget's disease treated with Reclast, MRI, EMG   Patient Stated Goals Back to normal   Currently in  Pain? Yes   Pain Score 3    Pain Location Back   Pain Orientation Lower   Pain Descriptors / Indicators Dull;Aching   Pain Type Chronic pain                         [facility] Adult PT Treatment/Exercise - 11/07/16 0901      Ambulation/Gait   Ambulation/Gait Yes   Ambulation/Gait Assistance 4: Min assist   Ambulation/Gait Assistance Details verbal cues to widen BOS due to RLE ADD and foot drag    Ambulation Distance (Feet) 150 Feet   Assistive device Rollator   Gait Pattern Step-through pattern;Decreased stride length;Decreased hip/knee flexion - right;Decreased dorsiflexion - right;Right flexed knee in stance;Scissoring;Antalgic;Narrow base of support;Trunk flexed;Poor foot clearance - right   Ambulation Surface Level;Indoor     Exercises   Exercises Lumbar;Knee/Hip     Lumbar Exercises: Stretches   Passive Hamstring Stretch 2 reps;60 seconds   Passive Hamstring Stretch Limitations R and LLE supine; also performed straight leg out to side to stretch ADD and medial hamstring x 60 seconds each side   Single Knee to Chest Stretch 1 rep;20 seconds   Single Knee to Chest Stretch Limitations R and LLE   Lower Trunk Rotation Other (comment)   Lower Trunk Rotation Limitations 8 reps to L and R     Lumbar Exercises: Supine   Dead Bug 10  reps   Dead Bug Limitations alternating hip flexion/march and hold at 90/90 with assistance to initiate hip flexion on RLE; contralateral LE on mat performing isometric extension   Bridge Non-compliant;10 reps     Lumbar Exercises: Sidelying   Clam 10 reps   Hip Abduction Other (comment)   Hip Abduction Limitations 3 reps each side combining hip ABD, extension<>flexion with AAROM                PT Education - 11/07/16 2105    Education provided Yes   Education Details continued hypotension, further stretches/exercises for LE strengthening   Person(s) Educated Patient;Spouse   Methods Explanation;Demonstration   Comprehension  Need further instruction          PT Short Term Goals - 10/29/16 1650      PT SHORT TERM GOAL #1   Title Pt will demonstrate independence with initial stretching and strengthening HEP   Time 4   Period Weeks   Status Revised   Target Date 11/28/16     PT SHORT TERM GOAL #2   Title Pt will report decreased pain to < or = 3/10 on a daily basis   Baseline 4   Time 4   Period Weeks   Status Revised   Target Date 11/28/16     PT SHORT TERM GOAL #3   Title Pt will improve functional LE strength to perform all 5 reps of five times sit to stand score to <30 seconds   Baseline 25 seconds to perform 2 from mat   Time 4   Period Weeks   Status Revised   Target Date 11/28/16     PT SHORT TERM GOAL #4   Title Pt will decrease falls risk during gait in community as indicated by increase in gait velocity to >2.9 ft/sec   Baseline --   Time 4   Period Weeks   Status Revised     PT SHORT TERM GOAL #5   Title Pt will ambulate x 300' over indoor and outdoor surfaces including ramp, curb, pavement with LRAD and negotiate 10 stairs with one rail and supervision with minimal foot drag and no LOB   Baseline --   Time 4   Period Weeks   Status Revised   Target Date 11/28/16           PT Long Term Goals - 10/29/16 1655      PT LONG TERM GOAL #1   Title Pt will be independent with stretching/strengthening and balance HEP   Time 8   Period Weeks   Status Revised   Target Date 12/28/16     PT LONG TERM GOAL #2   Title Pt will report pain as < or = 2/10 on a daily basis   Time 8   Period Weeks   Status Revised   Target Date 12/28/16     PT LONG TERM GOAL #3   Title Pt will improve LE strength to 3+/5 overall RLE, 4+/5 overall LLE and improve five times sit to stand to < or = 20 seconds   Time 8   Period Weeks   Status Revised   Target Date 12/28/16     PT LONG TERM GOAL #4   Title Pt will decrease falls risk during gait in community as indicated by gait velocity  >3.50ft/sec   Time 8   Period Weeks   Status New   Target Date 12/28/16     PT LONG TERM GOAL #5  Title Pt will improve Neuro QOL LE score by 15%   Baseline 29.2%   Time 8   Period Weeks   Status New   Target Date 12/28/16     PT LONG TERM GOAL #6   Title Pt will improve hamstring ROM to 45-50 deg bilaterally to decrease mm tension on low back   Time 8   Period Weeks   Status New   Target Date 12/28/16               Plan - 11/07/16 2107    Clinical Impression Statement Pt with improved symptoms overall today despite continuing to present with orthostatic hypotension.  Due to hypotension and pt reporting some lightheadedness with gait performed LE stretching and strengthening at mat level while also providing pt with fluids.  Pt tolerated all exercises well and was able to ambulate back into waiting area with rollator with min A and verbal cues for gait sequence.  Will continue to address and progress to standing strengthening and balance exercises as pt is able to tolerate.     Rehab Potential Good   Clinical Impairments Affecting Rehab Potential weakness and numbness have been progressive in nature, ankylosing spondylitis, unable to use conventional pain management techniques of Estim or kinesiotape due to h/o CA, undergoing medical work up for metastatic disease   PT Frequency 2x / week   PT Duration 8 weeks   PT Treatment/Interventions ADLs/Self Care Home Management;Aquatic Therapy;Moist Heat;Traction;DME Instruction;Gait training;Stair training;Functional mobility training;Therapeutic activities;Therapeutic exercise;Balance training;Neuromuscular re-education;Patient/family education;Orthotic Fit/Training;Passive range of motion;Energy conservation   PT Next Visit Plan MONITOR VITALS AT [facility] VISIT!  re-assess gait velocity; add LE stretching to HEP: hamstring, ER mm.  Review stair negotiation   Consulted and Agree with Plan of Care Patient;Family member/caregiver   Family  Member Consulted wife      Patient will benefit from skilled therapeutic intervention in order to improve the following deficits and impairments:  Pain, Decreased balance, Decreased range of motion, Decreased strength, Difficulty walking, Impaired flexibility, Impaired sensation, Abnormal gait, Decreased activity tolerance, Decreased coordination, Decreased endurance, Decreased mobility, Increased muscle spasms  Visit Diagnosis: Muscle weakness (generalized)  Other disturbances of skin sensation  Other abnormalities of gait and mobility  Chronic midline low back pain without sciatica     Problem List Patient Active Problem List   Diagnosis Date Noted  . Back pain without sciatica 10/25/2016  . 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 Hyperlipidemia 10/03/2016  . H/O prostate cancer 10/03/2016  . Hyponatremia 10/03/2016  . Guillain Barr syndrome ([place]) 10/03/2016  . Leg weakness 09/28/2016  . Constipated   . Tachycardia 08/16/2016  . Hypertension, essential 08/15/2016    Raylene Everts, PT, DPT 11/07/16    9:17 PM    [facility] [address] [place], [place], [address] Phone: [phone]   Fax:  [phone]  Name: Eduardo Burch MRN: [id number] Date of Birth: [birth year]

## 2016-11-07 NOTE — Telephone Encounter (Signed)
Pt has PT yesterday and when he arrived PT checked BP and it was low.  Remained low for a little while.  Wife called recently about BP being low prior to PT and was advised to check BP q AM and if lower than 110/90, hold Lisinopril.  They have not had to do this as BP has always been higher in the AM.  When BP is low pt is a little lightheaded and pale.  Wife states pt is fatigued and weak but doesn't know if it's the meds or the Padget's disease.  PT is twice weekly.  Advised wife to continue to monitor and if this continues to please contact our office.  Advised I would send message to Dr. Tamala Julian to see if any further recommendations.  Wife appreciative for call.

## 2016-11-07 NOTE — Telephone Encounter (Signed)
New message     Pt c/o medication issue:  1. Name of Medication:  Metoprolol 25mg  and lisinopril 10mg  2. How are you currently taking this medication (dosage and times per day)?  1 pill daily 3. Are you having a reaction (difficulty breathing--STAT)?  no 4. What is your medication issue?  Pt took medication and went to physical therapist yesterday.  2 hrs later bp was in the 80's top number.  Today PT was 148/89.  Calling to see if pt should continue taking both medications

## 2016-11-08 NOTE — Telephone Encounter (Signed)
Left message letting wife know no further recommendations at this time and to call back if any questions.

## 2016-11-08 NOTE — Telephone Encounter (Signed)
No further instructions

## 2016-11-09 ENCOUNTER — Ambulatory Visit: Payer: Medicare Other | Admitting: Physical Therapy

## 2016-11-09 DIAGNOSIS — R208 Other disturbances of skin sensation: Secondary | ICD-10-CM

## 2016-11-09 DIAGNOSIS — M545 Low back pain, unspecified: Secondary | ICD-10-CM

## 2016-11-09 DIAGNOSIS — R2689 Other abnormalities of gait and mobility: Secondary | ICD-10-CM

## 2016-11-09 DIAGNOSIS — M6281 Muscle weakness (generalized): Secondary | ICD-10-CM

## 2016-11-09 DIAGNOSIS — G8929 Other chronic pain: Secondary | ICD-10-CM

## 2016-11-10 NOTE — Therapy (Signed)
[facility] [address] [place], [place], [address] Phone: [phone]   Fax:  [phone]  Physical Therapy Treatment  Patient Details  Name: Eduardo Burch MRN: [id number] Date of Birth: [birth year] Referring Provider: Margaret Pyle, [place]  Encounter Date: 11/09/2016      PT End of Session - 11/09/16 2105    Visit Number 4   Number of Visits 17   Date for PT Re-Evaluation 12/28/16  per re-evaluation   [place] code and 10th visit PN   PT Start Time 1445   PT Stop Time 1536   PT Time Calculation (min) 51 min   Activity Tolerance Patient tolerated treatment well   Behavior During Therapy [facility] for tasks assessed/performed      Past Medical History:  Diagnosis Date  . Constipated   . Elevated blood pressure reading   . 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 Hypercholesterolemia   . Hypertension   . Spondylosis     Past Surgical History:  Procedure Laterality Date  . PROATATECTOMY      There were no vitals filed for this visit.      Subjective Assessment - 11/09/16 1509    Subjective Pt arrived in w/c; pt stating his LE are continuing to get weaker and balance is worse.  He can still ambulate in the house with the RW but is more careful.  Had appt with neurosurgeon yesterday who did not find any abnormalities in the spine or bone per pt report.     Patient is accompained by: Family member   Pertinent History prostate CA with prostatectomy-NO ESTIM or kinesiotape; akylosing spondylitis, HTN-assess vitals each visit, Paget's disease   Limitations Standing;Walking;Sitting   How long can you sit comfortably? 10 minutes   How long can you stand comfortably? 10 minutes   How long can you walk comfortably? 10 minutes   Diagnostic tests X rays revealed Paget's disease treated with Reclast, MRI, EMG   Patient Stated Goals Back to normal   Currently  in Pain? No/denies                         [facility] Adult PT Treatment/Exercise - 11/09/16 1515      Ambulation/Gait   Ambulation/Gait Yes   Ambulation/Gait Assistance 4: Min assist   Ambulation/Gait Assistance Details gait with 2 wheeled RW to assess a 2WW would be safer for pt to use at home vs. 4WW due to significant WB through UE.  Pt reports not feeling as secure with 2WW and wishes to continue to use 4WW.   Ambulation Distance (Feet) 50 Feet   Assistive device Rolling walker   Gait Pattern Step-through pattern;Decreased stride length;Decreased hip/knee flexion - right;Decreased dorsiflexion - right;Right flexed knee in stance;Scissoring;Antalgic;Narrow base of support;Trunk flexed;Poor foot clearance - right   Ambulation Surface Level;Indoor     Knee/Hip Exercises: Aerobic   Nustep L6 x 4 minutes with UE and LE; x 1.16 min with LE only at L5 and then decreasing to L4 due to fatigue             Balance Exercises - 11/09/16 2101      Balance Exercises: Standing   Marching Limitations forwards and backwards in // bars x 2 reps with min A and verbal cues for wider BOS     OTAGO PROGRAM   Sideways Walking Assistive device  // bars, stepping over  low obstacles to L and R           PT Education - 11/09/16 2104    Education provided Yes   Education Details seated and supine hamstring stretch for pt to perform at home   Person(s) Educated Patient;Spouse   Methods Explanation;Demonstration   Comprehension Need further instruction          PT Short Term Goals - 10/29/16 1650      PT SHORT TERM GOAL #1   Title Pt will demonstrate independence with initial stretching and strengthening HEP   Time 4   Period Weeks   Status Revised   Target Date 11/28/16     PT SHORT TERM GOAL #2   Title Pt will report decreased pain to < or = 3/10 on a daily basis   Baseline 4   Time 4   Period Weeks   Status Revised   Target Date 11/28/16     PT SHORT TERM GOAL  #3   Title Pt will improve functional LE strength to perform all 5 reps of five times sit to stand score to <30 seconds   Baseline 25 seconds to perform 2 from mat   Time 4   Period Weeks   Status Revised   Target Date 11/28/16     PT SHORT TERM GOAL #4   Title Pt will decrease falls risk during gait in community as indicated by increase in gait velocity to >2.9 ft/sec   Baseline --   Time 4   Period Weeks   Status Revised     PT SHORT TERM GOAL #5   Title Pt will ambulate x 300' over indoor and outdoor surfaces including ramp, curb, pavement with LRAD and negotiate 10 stairs with one rail and supervision with minimal foot drag and no LOB   Baseline --   Time 4   Period Weeks   Status Revised   Target Date 11/28/16           PT Long Term Goals - 10/29/16 1655      PT LONG TERM GOAL #1   Title Pt will be independent with stretching/strengthening and balance HEP   Time 8   Period Weeks   Status Revised   Target Date 12/28/16     PT LONG TERM GOAL #2   Title Pt will report pain as < or = 2/10 on a daily basis   Time 8   Period Weeks   Status Revised   Target Date 12/28/16     PT LONG TERM GOAL #3   Title Pt will improve LE strength to 3+/5 overall RLE, 4+/5 overall LLE and improve five times sit to stand to < or = 20 seconds   Time 8   Period Weeks   Status Revised   Target Date 12/28/16     PT LONG TERM GOAL #4   Title Pt will decrease falls risk during gait in community as indicated by gait velocity >3.14ft/sec   Time 8   Period Weeks   Status New   Target Date 12/28/16     PT LONG TERM GOAL #5   Title Pt will improve Neuro QOL LE score by 15%   Baseline 29.2%   Time 8   Period Weeks   Status New   Target Date 12/28/16     PT LONG TERM GOAL #6   Title Pt will improve hamstring ROM to 45-50 deg bilaterally to decrease mm tension on low back  Time 8   Period Weeks   Status New   Target Date 12/28/16               Plan - 11/09/16 2105     Clinical Impression Statement Pt continues to present with progressive LE weakness, impaired endurance, balance and gait.  Pt did not show increased endurance or stability with 2WW vs. 4WW during ambulation, demonstrated limited endurance while performing seated UE/LE strengthening and endurance on Nustep and limited activity tolerance and standing endurance while performing balance activities in // bars.  Will continue to address impairments to pt tolerance while pt continues medical work up.   Rehab Potential Good   Clinical Impairments Affecting Rehab Potential weakness and numbness have been progressive in nature, ankylosing spondylitis, unable to use conventional pain management techniques of Estim or kinesiotape due to h/o CA, undergoing medical work up for metastatic disease   PT Frequency 2x / week   PT Duration 8 weeks   PT Treatment/Interventions ADLs/Self Care Home Management;Aquatic Therapy;Moist Heat;Traction;DME Instruction;Gait training;Stair training;Functional mobility training;Therapeutic activities;Therapeutic exercise;Balance training;Neuromuscular re-education;Patient/family education;Orthotic Fit/Training;Passive range of motion;Energy conservation   PT Next Visit Plan MONITOR VITALS AT [facility] VISIT!  re-assess gait velocity; add LE stretching to HEP: hamstring, ER mm, hip flexor strengthening  Review stair negotiation   Consulted and Agree with Plan of Care Patient;Family member/caregiver   Family Member Consulted wife      Patient will benefit from skilled therapeutic intervention in order to improve the following deficits and impairments:  Pain, Decreased balance, Decreased range of motion, Decreased strength, Difficulty walking, Impaired flexibility, Impaired sensation, Abnormal gait, Decreased activity tolerance, Decreased coordination, Decreased endurance, Decreased mobility, Increased muscle spasms  Visit Diagnosis: Muscle weakness (generalized)  Other disturbances of skin  sensation  Other abnormalities of gait and mobility  Chronic midline low back pain without sciatica     Problem List Patient Active Problem List   Diagnosis Date Noted  . Back pain without sciatica 10/25/2016  . 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 Hyperlipidemia 10/03/2016  . H/O prostate cancer 10/03/2016  . Hyponatremia 10/03/2016  . 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 Leg weakness 09/28/2016  . Constipated   . Tachycardia 08/16/2016  . Hypertension, essential 08/15/2016    Raylene Everts, PT, DPT 11/10/16    10:38 AM    [facility] [address] [place], [place], [address] Phone: [phone]   Fax:  [phone]  Name: Eduardo Burch MRN: [id number] Date of Birth: 1940-05-14

## 2016-11-12 ENCOUNTER — Inpatient Hospital Stay (HOSPITAL_COMMUNITY)
Admission: EM | Admit: 2016-11-12 | Discharge: 2016-11-23 | DRG: 074 | Disposition: A | Discharge status: Rehabilitation Facility | Payer: Medicare Other | Attending: Internal Medicine | Admitting: Internal Medicine

## 2016-11-12 ENCOUNTER — Ambulatory Visit: Payer: Medicare Other | Admitting: Physical Therapy

## 2016-11-12 ENCOUNTER — Emergency Department (HOSPITAL_COMMUNITY): Payer: Medicare Other

## 2016-11-12 ENCOUNTER — Encounter: Payer: Self-pay | Admitting: Neurology

## 2016-11-12 ENCOUNTER — Encounter (HOSPITAL_COMMUNITY): Payer: Self-pay | Admitting: *Deleted

## 2016-11-12 ENCOUNTER — Ambulatory Visit (INDEPENDENT_AMBULATORY_CARE_PROVIDER_SITE_OTHER): Payer: Medicare Other | Admitting: Neurology

## 2016-11-12 VITALS — BP 110/60 | HR 82 | Resp 12

## 2016-11-12 DIAGNOSIS — Z79891 Long term (current) use of opiate analgesic: Secondary | ICD-10-CM | POA: Diagnosis not present

## 2016-11-12 DIAGNOSIS — M25572 Pain in left ankle and joints of left foot: Secondary | ICD-10-CM | POA: Diagnosis not present

## 2016-11-12 DIAGNOSIS — D1809 Hemangioma of other sites: Secondary | ICD-10-CM | POA: Diagnosis present

## 2016-11-12 DIAGNOSIS — Z888 Allergy status to other drugs, medicaments and biological substances status: Secondary | ICD-10-CM

## 2016-11-12 DIAGNOSIS — R1310 Dysphagia, unspecified: Secondary | ICD-10-CM

## 2016-11-12 DIAGNOSIS — I952 Hypotension due to drugs: Secondary | ICD-10-CM | POA: Diagnosis not present

## 2016-11-12 DIAGNOSIS — I959 Hypotension, unspecified: Secondary | ICD-10-CM | POA: Diagnosis not present

## 2016-11-12 DIAGNOSIS — Z8546 Personal history of malignant neoplasm of prostate: Secondary | ICD-10-CM

## 2016-11-12 DIAGNOSIS — Z7982 Long term (current) use of aspirin: Secondary | ICD-10-CM

## 2016-11-12 DIAGNOSIS — Z9079 Acquired absence of other genital organ(s): Secondary | ICD-10-CM

## 2016-11-12 DIAGNOSIS — Z79899 Other long term (current) drug therapy: Secondary | ICD-10-CM | POA: Diagnosis not present

## 2016-11-12 DIAGNOSIS — R627 Adult failure to thrive: Secondary | ICD-10-CM

## 2016-11-12 DIAGNOSIS — G6181 Chronic inflammatory demyelinating polyneuritis: Secondary | ICD-10-CM

## 2016-11-12 DIAGNOSIS — R490 Dysphonia: Secondary | ICD-10-CM | POA: Diagnosis present

## 2016-11-12 DIAGNOSIS — R29898 Other symptoms and signs involving the musculoskeletal system: Secondary | ICD-10-CM | POA: Diagnosis present

## 2016-11-12 DIAGNOSIS — R945 Abnormal results of liver function studies: Secondary | ICD-10-CM | POA: Diagnosis present

## 2016-11-12 DIAGNOSIS — M545 Low back pain: Secondary | ICD-10-CM | POA: Diagnosis present

## 2016-11-12 DIAGNOSIS — R634 Abnormal weight loss: Secondary | ICD-10-CM | POA: Diagnosis present

## 2016-11-12 DIAGNOSIS — E7800 Pure hypercholesterolemia, unspecified: Secondary | ICD-10-CM | POA: Diagnosis present

## 2016-11-12 DIAGNOSIS — I10 Essential (primary) hypertension: Secondary | ICD-10-CM | POA: Diagnosis present

## 2016-11-12 DIAGNOSIS — D72829 Elevated white blood cell count, unspecified: Secondary | ICD-10-CM

## 2016-11-12 DIAGNOSIS — E860 Dehydration: Secondary | ICD-10-CM | POA: Diagnosis present

## 2016-11-12 DIAGNOSIS — E876 Hypokalemia: Secondary | ICD-10-CM | POA: Diagnosis not present

## 2016-11-12 DIAGNOSIS — Z452 Encounter for adjustment and management of vascular access device: Secondary | ICD-10-CM

## 2016-11-12 DIAGNOSIS — M7918 Myalgia, other site: Secondary | ICD-10-CM | POA: Diagnosis not present

## 2016-11-12 DIAGNOSIS — R208 Other disturbances of skin sensation: Secondary | ICD-10-CM | POA: Diagnosis present

## 2016-11-12 DIAGNOSIS — Z87891 Personal history of nicotine dependence: Secondary | ICD-10-CM

## 2016-11-12 DIAGNOSIS — R2689 Other abnormalities of gait and mobility: Secondary | ICD-10-CM | POA: Diagnosis present

## 2016-11-12 DIAGNOSIS — K5900 Constipation, unspecified: Secondary | ICD-10-CM | POA: Diagnosis present

## 2016-11-12 DIAGNOSIS — R531 Weakness: Secondary | ICD-10-CM | POA: Diagnosis not present

## 2016-11-12 DIAGNOSIS — M25579 Pain in unspecified ankle and joints of unspecified foot: Secondary | ICD-10-CM

## 2016-11-12 DIAGNOSIS — G8220 Paraplegia, unspecified: Secondary | ICD-10-CM | POA: Diagnosis not present

## 2016-11-12 DIAGNOSIS — I951 Orthostatic hypotension: Secondary | ICD-10-CM | POA: Diagnosis not present

## 2016-11-12 DIAGNOSIS — M889 Osteitis deformans of unspecified bone: Secondary | ICD-10-CM | POA: Diagnosis present

## 2016-11-12 DIAGNOSIS — G8929 Other chronic pain: Secondary | ICD-10-CM | POA: Diagnosis present

## 2016-11-12 DIAGNOSIS — R7989 Other specified abnormal findings of blood chemistry: Secondary | ICD-10-CM

## 2016-11-12 DIAGNOSIS — M6281 Muscle weakness (generalized): Secondary | ICD-10-CM | POA: Diagnosis present

## 2016-11-12 DIAGNOSIS — R0989 Other specified symptoms and signs involving the circulatory and respiratory systems: Secondary | ICD-10-CM | POA: Diagnosis not present

## 2016-11-12 LAB — CBC WITH DIFFERENTIAL/PLATELET
Basophils Absolute: 0 10*3/uL (ref 0.0–0.1)
Basophils Relative: 0 %
Eosinophils Absolute: 0 10*3/uL (ref 0.0–0.7)
Eosinophils Relative: 0 %
HEMATOCRIT: 45.3 % (ref 39.0–52.0)
HEMOGLOBIN: 15.4 g/dL (ref 13.0–17.0)
LYMPHS PCT: 12 %
Lymphs Abs: 1.4 10*3/uL (ref 0.7–4.0)
MCH: 32.4 pg (ref 26.0–34.0)
MCHC: 34 g/dL (ref 30.0–36.0)
MCV: 95.4 fL (ref 78.0–100.0)
MONO ABS: 1.3 10*3/uL — AB (ref 0.1–1.0)
Monocytes Relative: 11 %
NEUTROS ABS: 9.1 10*3/uL — AB (ref 1.7–7.7)
NEUTROS PCT: 77 %
Platelets: 454 10*3/uL — ABNORMAL HIGH (ref 150–400)
RBC: 4.75 MIL/uL (ref 4.22–5.81)
RDW: 15.2 % (ref 11.5–15.5)
WBC: 11.9 10*3/uL — ABNORMAL HIGH (ref 4.0–10.5)

## 2016-11-12 LAB — COMPREHENSIVE METABOLIC PANEL
ALBUMIN: 3 g/dL — AB (ref 3.5–5.0)
ALK PHOS: 59 U/L (ref 38–126)
ALT: 82 U/L — AB (ref 17–63)
AST: 60 U/L — AB (ref 15–41)
Anion gap: 9 (ref 5–15)
BILIRUBIN TOTAL: 0.8 mg/dL (ref 0.3–1.2)
BUN: 29 mg/dL — ABNORMAL HIGH (ref 6–20)
CALCIUM: 9 mg/dL (ref 8.9–10.3)
CO2: 23 mmol/L (ref 22–32)
CREATININE: 0.96 mg/dL (ref 0.61–1.24)
Chloride: 101 mmol/L (ref 101–111)
GFR calc Af Amer: >60 mL/min (ref >60)
GLUCOSE: 100 mg/dL — AB (ref 65–99)
POTASSIUM: 4.8 mmol/L (ref 3.5–5.1)
Sodium: 133 mmol/L — ABNORMAL LOW (ref 135–145)
Total Protein: 6.5 g/dL (ref 6.5–8.1)

## 2016-11-12 LAB — FOLATE: FOLATE: 26 ng/mL (ref >5.9)

## 2016-11-12 LAB — VITAMIN B12: Vitamin B-12: 495 pg/mL (ref 180–914)

## 2016-11-12 LAB — SEDIMENTATION RATE: SED RATE: 47 mm/h — AB (ref 0–16)

## 2016-11-12 LAB — C-REACTIVE PROTEIN: CRP: <0.8 mg/dL (ref <1.0)

## 2016-11-12 NOTE — H&P (Addendum)
History and Physical    Eduardo Burch XIP:[id number] DOB: 02/21/1941 DOA: 11/12/2016  Referring MD/NP/PA: Dr. Davonna Belling PCP: Lavone Orn, MD  Patient coming from:  [facility] clinic  Chief Complaint: Lower extremity weakness  HPI: Eduardo Burch is a 76 y.o. male with medical history significant of the thecal HTN, prostate cancer s/p prostatectomy, a closing spondylitis, and Paget's disease; who presents with complaints of worsening lower extremity weakness. Symptoms apparently have been present over the last 4 months worse in the last 3-4 days. Patient reports having fallen from the front steps after his legs buckle. Unable to ambulate now. Patient has had extensive workup including MRI, PET scan, and EMG studies between here and [facility]. 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 Previously tried on IVIG. Other working differentials included paraneoplastic syndrome or chronic inflammatory polyradiculopathy. Associated symptoms include difficulty swallowing, fevers, chills, night sweats, 50 pounds weight loss over last 4 months.    ED Course: Neurology was consulted and initial  for ESR, CRP, vitamin B12, vitamin B1, copper, folate, SPEP with IFE, GM1 antibody, Lyme studies were obtained. Urinalysis and chest x-ray were otherwise negative for any signs of infection. [facility] called to admit.  Review of Systems  Constitutional: Positive for chills, fever, malaise/fatigue and weight loss.  HENT: Negative for ear discharge and nosebleeds.   Eyes: Negative for photophobia and pain.  Respiratory: Positive for shortness of breath.   Cardiovascular: Negative for chest pain and leg swelling.  Gastrointestinal: Negative for abdominal pain, diarrhea, nausea and vomiting.       Positive for dysphagia  Genitourinary: Negative for frequency and urgency.  Musculoskeletal: Positive for falls.  Skin: Negative for itching and rash.  Neurological: Positive for  tremors, focal weakness and weakness.  Endo/Heme/Allergies: Negative for polydipsia.  Psychiatric/Behavioral: Negative for memory loss and substance abuse.    Past Medical History:  Diagnosis Date  . Constipated   . Elevated blood pressure reading   . 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 Hypercholesterolemia   . Hypertension   . Spondylosis     Past Surgical History:  Procedure Laterality Date  . PROATATECTOMY       reports that he has quit smoking. His smoking use included Cigarettes. He has never used smokeless tobacco. He reports that he does not drink alcohol or use drugs.  Allergies  Allergen Reactions  . Remicade [Infliximab] Anaphylaxis    Family History  Problem Relation Age of Onset  . Lung cancer Mother   . Spondylitis Father     Prior to Admission medications   Medication Sig Start Date End Date Taking? Authorizing Provider  acetaminophen (TYLENOL) 500 MG tablet Take 500-1,000 mg by mouth every 6 (six) hours as needed for headache.   Yes [provider]  aspirin 81 MG chewable tablet Chew 1 tablet (81 mg total) by mouth daily. 10/04/16  Yes Sheikh, Omair Latif, DO  docusate sodium (COLACE) 100 MG capsule Take 1 capsule (100 mg total) by mouth every 12 (twelve) hours. 08/11/16  Yes Ward, Delice Bison, DO  gabapentin (NEURONTIN) 300 MG capsule Take 300 mg by mouth at bedtime.   Yes [provider]  lisinopril (PRINIVIL,ZESTRIL) 10 MG tablet Take 1 tablet (10 mg total) by mouth daily. 10/29/16  Yes Belva Crome, MD  metoprolol succinate (TOPROL XL) 25 MG 24 hr tablet Take 1 tablet (25 mg total) by mouth daily. 08/29/16  Yes Daneen Schick  W, MD  tiZANidine (ZANAFLEX) 2 MG tablet Take 2 mg by mouth 3 (three) times daily. 10/09/16  Yes [provider]  traMADol (ULTRAM) 50 MG tablet Take 1 tablet (50 mg total) by mouth every 6 (six) hours as needed for moderate pain or severe pain. 10/03/16  Yes Sheikh, [place], DO  UNABLE TO  FIND OUTPATIENT PHYSICAL THERAPY AND OCCUPATIONAL THERAPY  Dx: Guillain-Barre  Evaluation and Treat 10/03/16   Kerney Elbe, DO    Physical Exam:  Constitutional: Thin appearing male who appears ill Vitals:   11/12/16 1346 11/12/16 1609 11/12/16 2030 11/12/16 2100  BP: 117/84 93/61 119/76 124/78  Pulse: (!) 106 (!) 111 96 97  Resp: _0 Temp: 97.9 F (36.6 C)     TempSrc: Oral     SpO2: 98% 99% 98% 97%  Weight: 71.2 kg (157 lb)     Height: 6' (1.829 m)      Eyes: PERRL, lids and conjunctivae normal ENMT: Mucous membranes are dry. Posterior pharynx clear of any exudate or lesions  Neck: normal, supple, no masses, no thyromegaly Respiratory: clear to auscultation bilaterally, no wheezing, no crackles. Normal respiratory effort. No accessory muscle use.  Cardiovascular: Regular rate and rhythm, no murmurs / rubs / gallops. No extremity edema. 2+ pedal pulses. No carotid bruits.  Abdomen: no tenderness, no masses palpated. No hepatosplenomegaly. Bowel sounds positive.  Musculoskeletal: no clubbing / cyanosis. No joint deformity upper and lower extremities. Good ROM, no contractures. Normal muscle tone.  Skin: no rashes, lesions, ulcers. No induration Neurologic: CN 2-12 grossly intact. Sensation intact,Strength is 5/5 in the bilateral upper extremities Strength 4/5 in the bilateral lower extremities Psychiatric: Normal judgment and insight. Alert and oriented x 3. Agitated mood.     Labs on Admission: I have personally reviewed following labs and imaging studies  CBC:  Recent Labs Lab 11/12/16 2030  WBC 11.9*  NEUTROABS 9.1*  HGB 15.4  HCT 45.3  MCV 95.4  PLT 001*   Basic Metabolic Panel:  Recent Labs Lab 11/12/16 2030  NA 133*  K 4.8  CL 101  CO2 23  GLUCOSE 100*  BUN 29*  CREATININE 0.96  CALCIUM 9.0   GFR: Estimated Creatinine Clearance: 63.9 mL/min (by C-G formula based on SCr of 0.96 mg/dL). Liver Function Tests:  Recent Labs Lab  11/12/16 2030  AST 60*  ALT 82*  ALKPHOS 59  BILITOT 0.8  PROT 6.5  ALBUMIN 3.0*   No results for input(s): LIPASE, AMYLASE in the last 168 hours. No results for input(s): AMMONIA in the last 168 hours. Coagulation Profile: No results for input(s): INR, PROTIME in the last 168 hours. Cardiac Enzymes: No results for input(s): CKTOTAL, CKMB, CKMBINDEX, TROPONINI in the last 168 hours. BNP (last 3 results) No results for input(s): PROBNP in the last 8760 hours. HbA1C: No results for input(s): HGBA1C in the last 72 hours. CBG: No results for input(s): GLUCAP in the last 168 hours. Lipid Profile: No results for input(s): CHOL, HDL, LDLCALC, TRIG, CHOLHDL, LDLDIRECT in the last 72 hours. Thyroid Function Tests: No results for input(s): TSH, T4TOTAL, FREET4, T3FREE, THYROIDAB in the last 72 hours. Anemia Panel: No results for input(s): VITAMINB12, FOLATE, FERRITIN, TIBC, IRON, RETICCTPCT in the last 72 hours. Urine analysis:    Component Value Date/Time   COLORURINE YELLOW 08/11/2016 0130   APPEARANCEUR HAZY (A) 08/11/2016 0130   LABSPEC 1.026 08/11/2016 0130   PHURINE 5.0 08/11/2016 0130   GLUCOSEU NEGATIVE 08/11/2016  0130   HGBUR MODERATE (A) 08/11/2016 0130   BILIRUBINUR NEGATIVE 08/11/2016 0130   KETONESUR 5 (A) 08/11/2016 0130   PROTEINUR >=300 (A) 08/11/2016 0130   NITRITE NEGATIVE 08/11/2016 0130   LEUKOCYTESUR NEGATIVE 08/11/2016 0130   Sepsis Labs: No results found for this or any previous visit (from the past 240 hour(s)).   Radiological Exams on Admission: Dg Chest Portable 1 View  Result Date: 11/12/2016 CLINICAL DATA:  Bilateral leg numbness and cough. EXAM: PORTABLE CHEST 1 VIEW COMPARISON:  10/05/2005 FINDINGS: The heart size and mediastinal contours are within normal limits. Both lungs are clear. The visualized skeletal structures are unremarkable. IMPRESSION: No active disease. Electronically Signed   By: Misty Stanley M.D.   On: 11/12/2016 19:41      Assessment/Plan Progressive bilateral lower extremity weakness/ question CIDP: Acute on chronic. Symptoms have been ongoing over the last 4 months. Unclear cause of symptoms at this time. Neurology consulted and considering plasmapheresis. - Admit to telemetry. - Neuro checks - Follow-up pending blood work and studies including - PT/OT to eval and treat - Appreciate neurology consultative services, follow-up for further recommendations  Leukocytosis: Acute. WBC elevated at 11.9. Urinalysis was negative for signs of infection chest x-ray was otherwise clear. - Repeat CBC in a.m.  Dysphagia - Soft diet  - Speech therapy to check modified barium swallow  Essential hypertension - Continue metoprolol and lisinopril   Dehydration: Patient with elevated BUN to creatinine ratio of 29:0.96 on admission. - IV Fluids of NS 75 ml/hr  H/O prostate cancer and Paget's disease  DVT prophylaxis: lovenox Code Status: full Family Communication: No family present at bedside Disposition Plan: TBD Consults called: Neurology  Admission status:Inpatient   Norval Morton MD [facility] Pager [phone]   If 7PM-7AM, please contact night-coverage [email or web address] Password [facility]  11/12/2016, 10:44 PM

## 2016-11-12 NOTE — ED Triage Notes (Signed)
Pt arrived via EMS for increased in leg weakness and has been getting weakness since June.  Pt was seen at the medical center by Dr. Posey Pronto and sent him here for further evaluation.  They question Eduardo Burch

## 2016-11-12 NOTE — ED Notes (Signed)
Pt aware of need for urine sample. Urinal at bedside.  

## 2016-11-12 NOTE — Consult Note (Signed)
NEURO HOSPITALIST CONSULT NOTE   Requestig physician: Dr. Alvino Chapel    Reason for Consult: Bilateral leg weakness   History obtained from:   Patient and Chart    HPI:                                                                                                                                          Yoni Lobos is an 76 y.o. 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 He was seen today in clinic by his Neurologist, Dr. Narda Amber. Her note from today, which outlines in detail his diagnosis and clinical course was reviewed:  "Nathanal Hermiz is a 76 y.o. right-handed Caucasian male with hypertension, prostate cancer s/p prostatectomy, ankylosing spondylitis, and Paget's disease presenting for evaluation of progressively worsening leg weakness/GBS.    Starting around mid June 2017, patient started having severe low back pain followed by numbness of the buttocks region, thigh, and weakness of his upper legs. A few weeks later, he started having difficulty with climbing stairs and after being evaluated by his primary care doctor's office, was referred directly to the emergency room for evaluation. He was admitted to [facility] on 7/27 through 10/04/2015 and was seen by the neurology team with high clinical suspicion for Guillain-Barr Syndrome. Exam was notable for 4/5 bilateral lower extremity strength, diminished sensation in the legs, areflexia, and coarse action tremor in bilateral upper extremities. He underwent extensive testing including CSF analysis which showed albuminocytologic dissociation (CSF protein 215 with normal cell count and glucose) and he was started on IVIG for 5 days. Physical therapy assessed him for outpatient PT and recommended using a cane for assistance, which he was unwilling to use initially.   Imaging of the lumbar and thoracic spine with and without contrast did not show any nerve root enhancement,  however did demonstrate osseous changes at L5, S1, and left ilium and multiple scattered hemangiomas throughout thoracic spine consistent with Paget's disease.  PET scan in June 2018 was negative, that this was not felt to be metastatic in nature.  He went to out-patient PT and continued to have steady declined and suffered a fall in late August and started using a walker.  Because of progressive weakness, he was evaluated at [facility] by neurosurgeon Dr. Nikki Dom, who did not see anything surgical causing his symptoms. He had NCS/EMG of the right arm and leg at [facility] which showed active on chronic L4-L5 polyradiculoneuropathy.  He was then referred to see me by Dr. Vertell Limber due to his rapidly progressive weakness.  This morning, as he was leaving his home to come to the appointment, his legs completely buckled and gave-out on him while he was trying to go down the steps of his front porch.  He struggled to get into the car with his wife.  Once at our building, they used a wheelchair, but neither patient nor his wife feel that he is able to stand or get back into his car.  He also complains of difficulty swallowing and unable to take vitamin capsules.  His voice has also become hoarse. He saw ENT on 8/30 at who recommended MBS which is scheduled for tomorrow.   Of note, he has lost a remarkable 50lb over the past few months. He also complains of night sweats and chills.     Out-side paper records, electronic medical record, and images have been reviewed where available and summarized as:  CSF 09/28/2016: R2 W3 P215** G55  IgG index 0.7  OCB none  HSV, fungal cultures negative  MRI pelvis 09/12/2016: This addendum is given for the purpose of noting the L5-S1 level is autologously fused as seen on the prior CT scan. There is mild degenerative endplate edema in the superior endplate of L5. Disc bulge with endplate spur at F8-B0 does not appear to cause central canal stenosis.  MRI lumbar spine wwo contrast  09/24/2016: 1. No aggressive osseous lesion to suggest metastatic disease. 2. Osseous changes at L5, S1 and left ilium most compatible with Paget's disease. 3. Lumbar spine spondylosis as described above.  CT head 09/28/2016: Negative for acute hemorrhage. Mild atrophy with asymmetric enlargement of the left lateral ventricle temporal horn. Left temporal horn enlargement is of uncertain etiology.  MRI thoracic spine wwo contrast 10/02/2016: 1. Normal spinal cord signal. No enhancement. 2. 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 Two lesions in the T6 and T11 vertebral bodies demonstrate hypointensity on T1, and are favored to represent atypical hemangiomas over osseous metastatic disease given recent negative bone scan. Correlation with PSA is recommended. Also consider short-term follow-up MRI in 3-6 months. 3. Mild chronic anterior wedging of the T7 through T9 vertebral bodies.  NCS/EMG of the right arm and leg 10/08/2016:  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  No evidence of large fiber polyneuropathy of myopathy. [personally reviewed, only right sural and ulnar sensory responses were checked which were normal; motor - R ulnar, R tibial, R peroneal is normal; bilateral tibial H is absent.  Needle of genioglossus, deltoid, FDI is normal; R TP, TA, VL with active denervation; lumbar PSP normal]  MRI brain wwo contrast 10/26/2016:  Negative  MRI cervical spine wwo 10/26/2016:  Heterogeneous marrow signal without evidence of focal cervical spine osseous lesion. A benign osseous hemangioma is noted in the T2 vertebral  body."  Past Medical History:  Diagnosis Date  . Constipated   . Elevated blood pressure reading   . 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 Hypercholesterolemia   . Hypertension   . Spondylosis   Guillain-Barre syndrome  Past Surgical History:  Procedure Laterality Date  . PROATATECTOMY      Family  History  Problem Relation Age of Onset  . Lung cancer Mother   . Spondylitis Father     Social History:  reports that he has quit smoking. His smoking use included Cigarettes. He has never used smokeless tobacco. He reports that he does not drink alcohol or use drugs.  Allergies  Allergen Reactions  . Remicade [Infliximab] Anaphylaxis    MEDICATIONS:  Reviewed   ROS:                                                                                                                                         General ROS: negative for - chills, fatigue, fever, night sweats, weight gain or weight loss Psychological ROS: negative for - behavioral disorder, hallucinations, memory difficulties, mood swings or suicidal ideation Ophthalmic ROS: negative for - blurry vision, double vision, eye pain or loss of vision ENT ROS: negative for - epistaxis, nasal discharge, oral lesions, sore throat, tinnitus or vertigo Allergy and Immunology ROS: negative for - hives or itchy/watery eyes Hematological and Lymphatic ROS: negative for - bleeding problems, bruising or swollen lymph nodes Endocrine ROS: negative for - galactorrhea, hair pattern changes, polydipsia/polyuria or temperature intolerance Respiratory ROS: negative for - cough, hemoptysis, shortness of breath or wheezing Cardiovascular ROS: negative for - chest pain, dyspnea on exertion, edema or irregular heartbeat Gastrointestinal ROS: negative for - abdominal pain, diarrhea, hematemesis, nausea/vomiting or stool incontinence Genito-Urinary ROS: negative for - dysuria, hematuria, incontinence or urinary frequency/urgency Musculoskeletal ROS: negative for - joint swelling or muscular weakness Neurological ROS: as noted in HPI Dermatological ROS: negative for rash and skin lesion changes   Blood pressure 93/61, pulse (!) 111,  temperature 97.9 F (36.6 C), temperature source Oral, resp. rate 20, height 6' (1.829 m), weight 71.2 kg (157 lb), SpO2 99 %.   Neurologic Examination:                                                                                                      HEENT-  Normocephalic, no lesions, without obvious abnormality.  Normal external eye and conjunctiva.  Normal TM's bilaterally.  Normal auditory canals and external ears. Normal external nose, mucus membranes and septum.  Normal pharynx. Cardiovascular- , pulses palpable throughout   Lungs- normal respirations,  Abdomen- Non tender Extremities: bilateral hand tremors Musculoskeletal- no swelling Skin-normal  Neurological Examination Mental Status: Alert, oriented, thought content appropriate.  Speech fluent without evidence of aphasia.  Able to follow 3 step commands without difficulty. Cranial Nerves: II: Discs flat bilaterally; Visual fields grossly normal,  III,IV, VI: ptosis not present, extra-ocular motions intact bilaterally pupils equal, round, reactive to light and accommodation V,VII: smile symmetric, facial light touch sensation normal bilaterally VIII: hearing normal bilaterally IX,X: uvula rises symmetrically XI: bilateral shoulder shrug XII: midline tongue extension Motor: wasting of bilateral thighs  Right : Upper extremity    Left:  Upper extremity 5/5 deltoid       5/5 deltoid 5/5 tricep      5/5 tricep 5/5 biceps      5/5 biceps  5/5wrist flexion     5/5 wrist flexion 5/5 wrist extension     5/5 wrist extension 5/5 hand grip      5/5 hand grip  Lower extremity     Lower extremity 3/5 hip flexor      3/5 hip flexor 3/5 hip adductors     3/5 hip adductors 3/5 hip abductors     3/5 hip abductors 4/5 quadricep      4/5 quadriceps  4/5 hamstrings     4/5 hamstrings 4/5 plantar flexion       4/5 plantar flexion 4/5 plantar extension     4/5 plantar extension  Sensory: Pinprick and light touch intact throughout,  bilaterally Deep Tendon Reflexes: Absent biceps, triceps, patellar and achilles on both sides Plantars: Right: mute  Left: mute  Cerebellar: normal finger-to-nose, normal rapid alternating movements and normal heel-to-shin test Gait: normal gait and station      Lab Results: Basic Metabolic Panel: No results for input(s): NA, K, CL, CO2, GLUCOSE, BUN, CREATININE, CALCIUM, MG, PHOS in the last 168 hours.  Liver Function Tests: No results for input(s): AST, ALT, ALKPHOS, BILITOT, PROT, ALBUMIN in the last 168 hours. No results for input(s): LIPASE, AMYLASE in the last 168 hours. No results for input(s): AMMONIA in the last 168 hours.  CBC: No results for input(s): WBC, NEUTROABS, HGB, HCT, MCV, PLT in the last 168 hours.  Cardiac Enzymes: No results for input(s): CKTOTAL, CKMB, CKMBINDEX, TROPONINI in the last 168 hours.  Lipid Panel: No results for input(s): CHOL, TRIG, HDL, CHOLHDL, VLDL, LDLCALC in the last 168 hours.  CBG: No results for input(s): GLUCAP in the last 168 hours.  Microbiology: Results for orders placed or performed during the hospital encounter of 09/28/16  CSF culture     Status: None   Collection Time: 09/28/16 12:50 PM  Result Value Ref Range Status   Specimen Description CSF  Final   Special Requests NONE  Final   Gram Stain   Final    WBC PRESENT, PREDOMINANTLY MONONUCLEAR NO ORGANISMS SEEN CYTOSPIN SMEAR    Culture NO GROWTH  Final   Report Status 10/01/2016 FINAL  Final  Anaerobic culture     Status: None   Collection Time: 09/28/16 12:50 PM  Result Value Ref Range Status   Specimen Description CSF  Final   Special Requests NONE  Final   Culture NO ANAEROBES ISOLATED  Final   Report Status 10/03/2016 FINAL  Final  Fungus Culture With Stain     Status: None   Collection Time: 09/28/16 12:50 PM  Result Value Ref Range Status   Fungus Stain Final report  Final   Fungus (Mycology) Culture Final report  Final    Comment: (NOTE) Performed  At: [facility] [place], [place] [id number] Lindon Romp MD [id number]    Fungal Source CSF  Final  Fungus Culture Result     Status: None   Collection Time: 09/28/16 12:50 PM  Result Value Ref Range Status   Result 1 Comment  Final    Comment: (NOTE) KOH/Calcofluor preparation:  no fungus observed. Performed At: [facility] [place], [place] [id number] Lindon Romp MD [id number]   Fungal organism reflex     Status: None   Collection Time: 09/28/16 12:50  PM  Result Value Ref Range Status   Fungal result 1 Comment  Final    Comment: (NOTE) No yeast or mold isolated after 4 weeks. Performed At: [facility] [place], [place] [id number] Lindon Romp MD OX:[id number]     Coagulation Studies: No results for input(s): LABPROT, INR in the last 72 hours.  Imaging: No results found.    Assessment/Plan:  76 y.o. right-handed Caucasian male with hypertension, prostate cancer s/p prostatectomy, ankylosing spondylitis, and Paget's disease with recent diagnosis of AIDP presents worsening bilateral leg weakness and arreflexia. He states that he did not improve with IVIG and continue to worsen. NCS at [facility] was negative for demyelinating findings, however his symptoms appear to be motor affecting the proximal muscles.   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 Mirabile is a 76 year-old gentleman with history of Paget's disease, prostate cancer s/p prostatectomy, and hypertension referred for progressively worsening leg weakness and initial diagnosis of Guilliane Barre Syndrome.  Symptoms started in mid June 2018 and although briefly plateaued with IVIG at the end of August, however over the past 4 weeks, he has progressive deterioration in the muscle strength  of his legs, imbalance, and new complaints of dysphagia. I have reviewed his labs, imaging, NCS/EMG results from [place], and office notes.  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  It is interesting that there was no demyelinating findings, except for absent H reflexes on his NCS. Albeit, only ulnar and sural responses were tested on sensory nerve studies. There was no evidence of ALS.  We discussed repeating this study going forward, especially, if there is no improvement.  Today, his leg weakness has become so severe that his legs buckled as he was walking down the stairs off his porch and he is now unable to stand and nonambulatory, which is new.  I do not see how he would be able to return home safely.  Because of him being unable to stand/walk, failure to thrive (50lb weight loss), need for more aggressive treatment, and placement, EMS was called to transport patient to the ER for admission.    With his minimal improvement with IVIG, it is reasonable to offer a course of plasmapheresis. I suspect that he will need long-term immunomodulatory therapy and explained that the prognosis for improvement can take months.  I do not favor steroids with his Paget's disease.    Also recommend checking labs for ESR, CRP, vitamin B12, vitamin B1, copper, folate, SPEP with IFE, GM1 antibody, Lyme, and may consider paraneoplastic antibody panel given his unexplained weight loss.  He will also need MBS for his dysphagia.  He has unintentional weight loss of >50lb this summer and complains of night sweats/chills, which raises the possibility of underlying malignancy.  PET scan in June 2018 did not show malignancy, I would favor repeating malignancy evaluation as CIDP does not manifesting with these constitutional symptoms.  OK to stop gabapentin.  Recommend tizanidine only as needed,  as to prevent worsening his weakness. "   Plan  1. Admit patient for 5 days of Plasmapheresis starting tomorrow ( admit to stepdown unit)  - will coordinate with CCM to place PLEX catheter.  2. Will order labs recommended by Dr Posey Pronto 3. Dysphagia evaluation, until then he will  need to be soft solid diet( patient requests regular and not heart healthy diet) 4. Neurochecks q6h     Karena Addison Eliav Mechling MD [facility] [phone]  If 7pm to 7am, please call on call as listed on AMION.

## 2016-11-12 NOTE — ED Provider Notes (Signed)
[facility] [place] DEPT Provider Note   CSN: [id number] Arrival date & time: 11/12/16  1342     History   Chief Complaint Chief Complaint  Patient presents with  . Extremity Weakness    HPI Eduardo Burch is a 76 y.o. male.  HPI Patient was sent in by neurology for admission. Has generalized weakness. Was seen at Dr. Serita Grit office today. Had been treated before first onset of Guillain-Barr started in June. Had done a little better with IVIG but got worse over the last 4 weeks. Is also lost around 50 pounds. No clear cause his been worked up and found in the past. Thought of paraneoplastic considered has had workups both here and at [facility]. Thought to be chronic inflammatory demyelinating polyneuropathy. Has his legs give out today and has been walking with a walker. Past Medical History:  Diagnosis Date  . Constipated   . Elevated blood pressure reading   . 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 Hypercholesterolemia   . Hypertension   . Spondylosis     Patient Active Problem List   Diagnosis Date Noted  . CIDP (chronic inflammatory demyelinating polyneuropathy) ([place]) 11/12/2016  . Back pain without sciatica 10/25/2016  . 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 Hyperlipidemia 10/03/2016  . H/O prostate cancer 10/03/2016  . Hyponatremia 10/03/2016  . Leg weakness 09/28/2016  . Constipated   . Tachycardia 08/16/2016  . Hypertension, essential 08/15/2016    Past Surgical History:  Procedure Laterality Date  . PROATATECTOMY         Home Medications    Prior to Admission medications   Medication Sig Start Date End Date Taking? Authorizing Provider  acetaminophen (TYLENOL) 500 MG tablet Take 500-1,000 mg by mouth every 6 (six) hours as needed for headache.   Yes [provider]  aspirin 81 MG chewable tablet Chew 1 tablet (81 mg total) by mouth daily. 10/04/16  Yes Sheikh, Omair Latif, DO  docusate sodium (COLACE) 100 MG capsule Take 1 capsule  (100 mg total) by mouth every 12 (twelve) hours. 08/11/16  Yes Ward, Delice Bison, DO  gabapentin (NEURONTIN) 300 MG capsule Take 300 mg by mouth at bedtime.   Yes [provider]  lisinopril (PRINIVIL,ZESTRIL) 10 MG tablet Take 1 tablet (10 mg total) by mouth daily. 10/29/16  Yes Belva Crome, MD  metoprolol succinate (TOPROL XL) 25 MG 24 hr tablet Take 1 tablet (25 mg total) by mouth daily. 08/29/16  Yes Belva Crome, MD  tiZANidine (ZANAFLEX) 2 MG tablet Take 2 mg by mouth 3 (three) times daily. 10/09/16  Yes [provider]  traMADol (ULTRAM) 50 MG tablet Take 1 tablet (50 mg total) by mouth every 6 (six) hours as needed for moderate pain or severe pain. 10/03/16  Yes Sheikh, Omair Latif, DO  UNABLE TO FIND OUTPATIENT PHYSICAL THERAPY AND OCCUPATIONAL THERAPY  Dx: Guillain-Barre  Evaluation and Treat 10/03/16   Kerney Elbe, DO    Family History Family History  Problem Relation Age of Onset  . Lung cancer Mother   . Spondylitis Father     Social History Social History  Substance Use Topics  . Smoking status: Former Smoker    Types: Cigarettes  . Smokeless tobacco: Never Used  . Alcohol use No     Allergies   Remicade [infliximab]   Review of Systems Review of Systems  Constitutional: Positive for appetite change and unexpected weight change.  HENT: Negative for congestion.  Respiratory: Negative for shortness of breath.   Cardiovascular: Negative for chest pain.  Gastrointestinal: Negative for abdominal pain.  Genitourinary: Negative for flank pain.  Musculoskeletal: Positive for back pain.  Neurological: Positive for tremors and weakness.  Psychiatric/Behavioral: Negative for confusion.     Physical Exam Updated Vital Signs BP 124/78   Pulse 97   Temp 97.9 F (36.6 C) (Oral)   Resp 18   Ht 6' (1.829 m)   Wt 71.2 kg (157 lb)   SpO2 97%   BMI 21.29 kg/m   Physical Exam  Constitutional: He appears well-developed.  HENT:  Head:  Normocephalic.  Eyes: Pupils are equal, round, and reactive to light.  Cardiovascular:  Mild tachycardia  Pulmonary/Chest: Effort normal.  Neurological: He is alert.  Patient is awake and appropriate. Tremor in bilateral upper extremities but more movement. Decreased strength in bilateral lower extremities. May have tremors in the right foot but not quite clonus. Decreased strength in bilateral lower extremities. Complete neurologic examination done by neurology as an outpatient and reviewed by me.  Skin: Skin is warm.     ED Treatments / Results  Labs (all labs ordered are listed, but only abnormal results are displayed) Labs Reviewed  CBC WITH DIFFERENTIAL/PLATELET - Abnormal; Notable for the following:       Result Value   WBC 11.9 (*)    Platelets 454 (*)    Neutro Abs 9.1 (*)    Monocytes Absolute 1.3 (*)    All other components within normal limits  COMPREHENSIVE METABOLIC PANEL - Abnormal; Notable for the following:    Sodium 133 (*)    Glucose, Bld 100 (*)    BUN 29 (*)    Albumin 3.0 (*)    AST 60 (*)    ALT 82 (*)    All other components within normal limits  SEDIMENTATION RATE  C-REACTIVE PROTEIN  VITAMIN B12  FOLATE  VITAMIN B1  B. BURGDORFI ANTIBODIES  MISC [facility] TEST (SEND OUT)  MISC [facility] TEST (SEND OUT)  PROTEIN ELECTROPHORESIS, SERUM  URINALYSIS, ROUTINE W REFLEX MICROSCOPIC  COPPER, SERUM    EKG  EKG Interpretation None       Radiology Dg Chest Portable 1 View  Result Date: 11/12/2016 CLINICAL DATA:  Bilateral leg numbness and cough. EXAM: PORTABLE CHEST 1 VIEW COMPARISON:  10/05/2005 FINDINGS: The heart size and mediastinal contours are within normal limits. Both lungs are clear. The visualized skeletal structures are unremarkable. IMPRESSION: No active disease. Electronically Signed   By: Misty Stanley M.D.   On: 11/12/2016 19:41    Procedures Procedures (including critical care time)  Medications Ordered in ED Medications - No data  to display   Initial Impression / Assessment and Plan / ED Course  I have reviewed the triage vital signs and the nursing notes.  Pertinent labs & imaging results that were available during my care of the patient were reviewed by me and considered in my medical decision making (see chart for details).     Patient sent in for admission. Increasing weakness. May need more neurologic workup. I also need placement. Thought of chronic inflammatory demyelinating polyneuropathy. Also per neurology may need more research for malignancy. Admit to internal medicine.  Final Clinical Impressions(s) / ED Diagnoses   Final diagnoses:  CIDP (chronic inflammatory demyelinating polyneuropathy) ([place])    New Prescriptions New Prescriptions   No medications on file     Davonna Belling, MD 11/12/16 2219

## 2016-11-12 NOTE — ED Notes (Signed)
Pt roomed per neurology MD request

## 2016-11-12 NOTE — ED Notes (Signed)
Portable xray at bedside.

## 2016-11-12 NOTE — ED Notes (Signed)
Pt reports increased weakness for the past week. Pt has been using walker to get around but as of today his "legs gave out" on him and he is unable to walk at all

## 2016-11-12 NOTE — Progress Notes (Signed)
[facility] Clinic Note - Initial Visit   Date: 11/12/16  Eduardo Burch MRN: [id number] DOB: March 29, 1940   Dear Dr. Vertell Limber:  Thank you for your kind referral of Eduardo Burch for consultation of progressive bilateral leg weakness. Although his history is well known to you, please allow [place] to reiterate it for the purpose of our medical record. The patient was accompanied to the clinic by wife who also provides collateral information.     History of Present Illness: Eduardo Burch is a 76 y.o. right-handed Caucasian male with hypertension, prostate cancer s/p prostatectomy, ankylosing spondylitis, and Paget's disease presenting for evaluation of progressively worsening leg weakness/GBS.    Starting around mid June 2017, patient started having severe low back pain followed by numbness of the buttocks region, thigh, and weakness of his upper legs. A few weeks later, he started having difficulty with climbing stairs and after being evaluated by his primary care doctor's office, was referred directly to the emergency room for evaluation. He was admitted to [facility] on 7/27 through 10/04/2015 and was seen by the neurology team with high clinical suspicion for Guillain-Barr Syndrome. Exam was notable for 4/5 bilateral lower extremity strength, diminished sensation in the legs, areflexia, and coarse action tremor in bilateral upper extremities. He underwent extensive testing including CSF analysis which showed albuminocytologic dissociation (CSF protein 215 with normal cell count and glucose) and he was started on IVIG for 5 days. Physical therapy assessed him for outpatient PT and recommended using a cane for assistance, which he was unwilling to use initially.   Imaging of the lumbar and thoracic spine with and without contrast did not show any nerve root enhancement, however did demonstrate osseous changes at L5, S1, and left ilium and multiple scattered hemangiomas throughout  thoracic spine consistent with Paget's disease.  PET scan in June 2018 was negative, that this was not felt to be metastatic in nature.  He went to out-patient PT and continued to have steady declined and suffered a fall in late August and started using a walker.  Because of progressive weakness, he was evaluated at [facility] by neurosurgeon Dr. Nikki Dom, who did not see anything surgical causing his symptoms. He had NCS/EMG of the right arm and leg at [facility] which showed active on chronic L4-L5 polyradiculoneuropathy.  He was then referred to see me by Dr. Vertell Limber due to his rapidly progressive weakness.  This morning, as he was leaving his home to come to the appointment, his legs completely buckled and gave-out on him while he was trying to go down the steps of his front porch. He struggled to get into the car with his wife.  Once at our building, they used a wheelchair, but neither patient nor his wife feel that he is able to stand or get back into his car.  He also complains of difficulty swallowing and unable to take vitamin capsules.  His voice has also become hoarse. He saw ENT on 8/30 at who recommended MBS which is scheduled for tomorrow.   Of note, he has lost a remarkable 50lb over the past few months. He also complains of night sweats and chills.     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 There is mild degenerative endplate edema in the superior endplate of L5. Disc bulge with endplate spur at Y5-K3 does not appear to cause central canal stenosis.  MRI lumbar spine wwo contrast 09/24/2016: 1. No aggressive osseous lesion to suggest metastatic disease. 2. Osseous changes at L5, S1 and left ilium  most compatible with Paget's disease. 3. Lumbar spine spondylosis as described above.  CT head 09/28/2016: Negative for acute hemorrhage. Mild atrophy with asymmetric enlargement of the left lateral ventricle temporal horn. Left temporal horn enlargement is of uncertain etiology.  MRI thoracic spine wwo contrast 10/02/2016: 1. Normal spinal cord signal.  No enhancement. 2. 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 Two lesions in the T6 and T11 vertebral bodies demonstrate hypointensity on T1, and are favored to represent atypical hemangiomas over osseous metastatic disease given recent negative bone scan. Correlation with PSA is recommended. Also consider short-term follow-up MRI in 3-6 months. 3. Mild chronic anterior wedging of the T7 through T9 vertebral bodies.  NCS/EMG of the right arm and leg 10/08/2016:  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  No evidence of large fiber polyneuropathy of myopathy. [personally reviewed, only right sural and ulnar sensory responses were checked which were normal; motor - R ulnar, R tibial, R peroneal is normal; bilateral tibial H is absent.  Needle of genioglossus, deltoid, FDI is normal; R TP, TA, VL with active denervation; lumbar PSP normal]  MRI brain wwo contrast 10/26/2016:  Negative  MRI cervical spine wwo 10/26/2016:  Heterogeneous marrow signal without evidence of focal cervical spine osseous lesion. A benign osseous hemangioma is noted in the T2 vertebral  body.  Past Medical History:  Diagnosis Date  . Constipated   . Elevated blood pressure reading   . 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 Hypercholesterolemia   . Hypertension   . Spondylosis     Past Surgical History:  Procedure Laterality Date  . PROATATECTOMY       Medications:  Outpatient Encounter Prescriptions as of 11/12/2016  Medication Sig  . acetaminophen (TYLENOL) 500 MG tablet Take 500-1,000 mg by mouth  every 6 (six) hours as needed for headache.  Marland Kitchen aspirin 81 MG chewable tablet Chew 1 tablet (81 mg total) by mouth daily.  Marland Kitchen docusate sodium (COLACE) 100 MG capsule Take 1 capsule (100 mg total) by mouth every 12 (twelve) hours.  Marland Kitchen lisinopril (PRINIVIL,ZESTRIL) 10 MG tablet Take 1 tablet (10 mg total) by mouth daily.  . metoprolol succinate (TOPROL XL) 25 MG 24 hr tablet Take 1 tablet (25 mg total) by mouth daily.  Marland Kitchen tiZANidine (ZANAFLEX) 2 MG tablet Take 2 mg by mouth 3 (three) times daily.  . traMADol (ULTRAM) 50 MG tablet Take 1 tablet (50 mg total) by mouth every 6 (six) hours as needed for moderate pain or severe pain.  Marland Kitchen UNABLE TO FIND OUTPATIENT PHYSICAL THERAPY AND OCCUPATIONAL THERAPY  Dx: Guillain-Barre  Evaluation and Treat  . gabapentin (NEURONTIN) 300 MG capsule Take 300 mg by mouth at bedtime.   No facility-administered encounter medications on file as of 11/12/2016.      Allergies:  Allergies  Allergen Reactions  . Remicade [Infliximab] Anaphylaxis    Family History: Family History  Problem Relation Age of Onset  . Lung cancer Mother   . Spondylitis Father     Social History: Social History  Substance Use Topics  . Smoking status: Former Smoker  Types: Cigarettes  . Smokeless tobacco: Never Used  . Alcohol use No   Social History   Social History Narrative   Lives with wife in a 2 story home.  Has one son.  Retired from [employer].  Education: college.     Review of Systems:  CONSTITUTIONAL: +fevers, + chills, +night sweats, + 50lb unintentional weight loss.   EYES: No visual changes or eye pain ENT: No hearing changes.  No history of nose bleeds.   RESPIRATORY: No cough, wheezing and shortness of breath.   CARDIOVASCULAR: Negative for chest pain, and palpitations.   GI: Negative for abdominal discomfort, blood in stools or black stools.  No recent change in bowel habits.   GU:  No history of incontinence.   MUSCLOSKELETAL: No history of joint pain or  swelling.  No myalgias.   SKIN: Negative for lesions, rash, and itching.   HEMATOLOGY/ONCOLOGY: Negative for prolonged bleeding, bruising easily, and swollen nodes.  +history of cancer.   ENDOCRINE: Negative for cold or heat intolerance, polydipsia or goiter.   PSYCH:  No depression or anxiety symptoms.   NEURO: As Above.   Vital Signs:  BP 110/60   Pulse 82   Resp 12   SpO2 96%    General Medical Exam:   General:  Thin-appearing, comfortable, sitting in wheelchair.   Eyes/ENT: see cranial nerve examination.   Neck: No masses appreciated.  Full range of motion without tenderness.  No carotid bruits. Respiratory:  Clear to auscultation, good air entry bilaterally.   Cardiac:  Regular rate and rhythm, no murmur.   Extremities:  No deformities, edema, or skin discoloration.  Skin:  No rashes or lesions.  Neurological Exam: MENTAL STATUS including orientation to time, place, person, recent and remote memory, attention span and concentration, language, and fund of knowledge is normal.  Speech is not dysarthric.  CRANIAL NERVES: II:  No visual field defects.   III-IV-VI: Pupils equal round and reactive to light.  Normal conjugate, extra-ocular eye movements in all directions of gaze.  No nystagmus.  No ptosis.   V:  Normal facial sensation.  VII:  Normal facial symmetry and movements.  No pathologic facial reflexes.  VIII:  Normal hearing and vestibular function.   IX-X:  Normal palatal movement.   XI:  Normal shoulder shrug and head rotation.   XII:  Normal tongue strength and range of motion, no deviation or fasciculation.  MOTOR:  Marked loss of muscle bulk throughout, especially proximal arms and legs.  No fasciculations.  Coarse tremors of the upper body, arrhythmical and worse with action.   No pronator drift.  Tone is normal.    Right Upper Extremity:    Left Upper Extremity:    Deltoid  5/5   Deltoid  5/5   Biceps  5/5   Biceps  5/5   Triceps  5/5   Triceps  5/5   Wrist  extensors  5/5   Wrist extensors  5/5   Wrist flexors  5/5   Wrist flexors  5/5   Finger extensors  5/5   Finger extensors  5/5   Finger flexors  5/5   Finger flexors  5/5   Dorsal interossei  4/5   Dorsal interossei  4/5   Abductor pollicis  5/5   Abductor pollicis  5/5   Tone (Ashworth scale)  0  Tone (Ashworth scale)  0   Right Lower Extremity:    Left Lower Extremity:    Hip flexors  2/5  Hip flexors  2/5   Hip extensors  3/5   Hip extensors  3/5   Adductor 3/5  Adductor 3/5  Abductor 3/5  Adductor 3/5  Knee flexors  4/5   Knee flexors  4+/5   Knee extensors  4/5   Knee extensors  4+/5   Dorsiflexors  4+/5   Dorsiflexors  4+/5   Plantarflexors  4+/5   Plantarflexors  4+/5   Toe extensors  4+/5   Toe extensors  4+/5   Toe flexors  4+/5   Toe flexors  4+/5   Tone (Ashworth scale)  0  Tone (Ashworth scale)  0   MSRs:   Diffusely absent reflexes throughout.  Right                                                                 Left brachioradialis 0  brachioradialis 0  Biceps 0  Biceps 0  triceps 0  triceps 0  Patellar 0  Patellar 0  ankle jerk 0  ankle jerk 2+  plantar response mute  plantar response mute   SENSORY:  Vibration is absent distally at the toes only, intact at the ankles, knees, and MCPs.  Temperature and pin prick intact throughout.  Rhomberg was unable to be tested.  COORDINATION/GAIT: Normal finger-to- nose-finger.  Intact rapid alternating movements bilaterally.  Gait was not tested due to patient's severe weakness.   IMPRESSION/PLAN: Eduardo Burch is a 76 year-old gentleman with history of Paget's disease, prostate cancer s/p prostatectomy, and hypertension referred for progressively worsening leg weakness and initial diagnosis of Guilliane Barre Syndrome.  Symptoms started in mid June 2018 and although briefly plateaued with IVIG at the end of August, however over the past 4 weeks, he has progressive deterioration in the muscle strength of his legs, imbalance, and  new complaints of dysphagia. I have reviewed his labs, imaging, NCS/EMG results from [facility], and office notes.  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  It is interesting that there was no demyelinating findings, except for absent H reflexes on his NCS. Albeit, only ulnar and sural responses were tested on sensory nerve studies. There was no evidence of ALS.  We discussed repeating this study going forward, especially, if there is no improvement.  Today, his leg weakness has become so severe that his legs buckled as he was walking down the stairs off his porch and he is now unable to stand and nonambulatory, which is new.  I do not see how he would be able to return home safely.  Because of him being unable to stand/walk, failure to thrive (50lb weight loss), need for more aggressive treatment, and placement, EMS was called to transport patient to the ER for admission.    With his minimal improvement with IVIG, it is reasonable to offer a course of plasmapheresis. I suspect that he will need long-term immunomodulatory therapy and explained that the prognosis for improvement can take months.  I do not favor steroids with his Paget's disease.    Also recommend checking labs for ESR, CRP, vitamin B12, vitamin B1, copper, folate, SPEP with IFE, GM1 antibody, Lyme, and may consider paraneoplastic antibody panel  given his unexplained weight loss.  He will also need MBS for his dysphagia.  He has unintentional weight loss of >50lb this summer and complains of night sweats/chills, which raises the possibility of underlying malignancy.  PET scan in June 2018 did not show malignancy, I would favor repeating malignancy evaluation as CIDP does not manifesting with these constitutional symptoms.  OK to stop gabapentin.  Recommend tizanidine only as needed, as to prevent worsening his  weakness.    Patient will have close follow-up with me in 2-3 weeks.     The duration of this appointment visit was 90 minutes of face-to-face time with the patient.  Greater than 50% of this time was spent in counseling, explanation of diagnosis, planning of further management, and coordination of care.   Thank you for allowing me to participate in patient's care.  If I can answer any additional questions, I would be pleased to do so.    Sincerely,    Aarion Metzgar K. Posey Pronto, DO

## 2016-11-13 ENCOUNTER — Inpatient Hospital Stay (HOSPITAL_COMMUNITY): Payer: Medicare Other

## 2016-11-13 ENCOUNTER — Other Ambulatory Visit: Payer: Medicare Other

## 2016-11-13 DIAGNOSIS — R1310 Dysphagia, unspecified: Secondary | ICD-10-CM

## 2016-11-13 DIAGNOSIS — R29898 Other symptoms and signs involving the musculoskeletal system: Secondary | ICD-10-CM | POA: Diagnosis present

## 2016-11-13 DIAGNOSIS — R945 Abnormal results of liver function studies: Secondary | ICD-10-CM

## 2016-11-13 DIAGNOSIS — D72829 Elevated white blood cell count, unspecified: Secondary | ICD-10-CM

## 2016-11-13 LAB — I-STAT CHEM 8, ED
BUN: 26 mg/dL — ABNORMAL HIGH (ref 6–20)
CALCIUM ION: 1.13 mmol/L — AB (ref 1.15–1.40)
CREATININE: 0.6 mg/dL — AB (ref 0.61–1.24)
Chloride: 103 mmol/L (ref 101–111)
GLUCOSE: 104 mg/dL — AB (ref 65–99)
HCT: 42 % (ref 39.0–52.0)
HEMOGLOBIN: 14.3 g/dL (ref 13.0–17.0)
Potassium: 4.1 mmol/L (ref 3.5–5.1)
Sodium: 139 mmol/L (ref 135–145)
TCO2: 22 mmol/L (ref 22–32)

## 2016-11-13 LAB — COMPREHENSIVE METABOLIC PANEL
ALT: 76 U/L — ABNORMAL HIGH (ref 17–63)
ANION GAP: 6 (ref 5–15)
AST: 47 U/L — ABNORMAL HIGH (ref 15–41)
Albumin: 3 g/dL — ABNORMAL LOW (ref 3.5–5.0)
Alkaline Phosphatase: 59 U/L (ref 38–126)
BUN: 27 mg/dL — AB (ref 6–20)
CHLORIDE: 104 mmol/L (ref 101–111)
CO2: 27 mmol/L (ref 22–32)
CREATININE: 0.79 mg/dL (ref 0.61–1.24)
Calcium: 8.7 mg/dL — ABNORMAL LOW (ref 8.9–10.3)
Glucose, Bld: 108 mg/dL — ABNORMAL HIGH (ref 65–99)
Potassium: 5.1 mmol/L (ref 3.5–5.1)
SODIUM: 137 mmol/L (ref 135–145)
Total Bilirubin: 0.7 mg/dL (ref 0.3–1.2)
Total Protein: 6.4 g/dL — ABNORMAL LOW (ref 6.5–8.1)

## 2016-11-13 LAB — URINALYSIS, ROUTINE W REFLEX MICROSCOPIC
BACTERIA UA: NONE SEEN
Bilirubin Urine: NEGATIVE
GLUCOSE, UA: NEGATIVE mg/dL
HGB URINE DIPSTICK: NEGATIVE
Ketones, ur: 5 mg/dL — AB
LEUKOCYTES UA: NEGATIVE
Nitrite: NEGATIVE
Specific Gravity, Urine: 1.031 — ABNORMAL HIGH (ref 1.005–1.030)
pH: 5 (ref 5.0–8.0)

## 2016-11-13 LAB — CBC
HCT: 44.1 % (ref 39.0–52.0)
Hemoglobin: 15 g/dL (ref 13.0–17.0)
MCH: 32.8 pg (ref 26.0–34.0)
MCHC: 34 g/dL (ref 30.0–36.0)
MCV: 96.3 fL (ref 78.0–100.0)
PLATELETS: 443 10*3/uL — AB (ref 150–400)
RBC: 4.58 MIL/uL (ref 4.22–5.81)
RDW: 15.3 % (ref 11.5–15.5)
WBC: 9.8 10*3/uL (ref 4.0–10.5)

## 2016-11-13 LAB — APTT: APTT: 26 s (ref 24–36)

## 2016-11-13 LAB — PROTIME-INR
INR: 0.97
PROTHROMBIN TIME: 12.8 s (ref 11.4–15.2)

## 2016-11-13 MED ORDER — ACETAMINOPHEN 325 MG PO TABS: 650.0000 mg | ORAL_TABLET | Freq: Four times a day (QID) | ORAL | Status: DC | PRN

## 2016-11-13 MED ORDER — ASPIRIN 81 MG PO CHEW
81.0000 mg | CHEWABLE_TABLET | Freq: Every day | ORAL | Status: DC
  Administered 2016-11-13 – 2016-11-23 (×11): 81 mg via ORAL
  Filled 2016-11-13 (×11): qty 1

## 2016-11-13 MED ORDER — SODIUM CHLORIDE 0.9 % IV SOLN: 2.0000 g | Freq: Once | INTRAVENOUS | Status: DC

## 2016-11-13 MED ORDER — TIZANIDINE HCL 2 MG PO TABS
2.0000 mg | ORAL_TABLET | Freq: Three times a day (TID) | ORAL | Status: DC | PRN
  Administered 2016-11-19: 2 mg via ORAL
  Filled 2016-11-13 (×2): qty 1

## 2016-11-13 MED ORDER — METOPROLOL SUCCINATE ER 25 MG PO TB24
25.0000 mg | ORAL_TABLET | Freq: Every day | ORAL | Status: DC
  Administered 2016-11-13 – 2016-11-23 (×11): 25 mg via ORAL
  Filled 2016-11-13 (×11): qty 1

## 2016-11-13 MED ORDER — SODIUM CHLORIDE 0.9 % IV SOLN
2.0000 g | Freq: Once | INTRAVENOUS | Status: AC
  Administered 2016-11-13: 2 g via INTRAVENOUS
  Filled 2016-11-13 (×2): qty 20

## 2016-11-13 MED ORDER — ACD FORMULA A 0.73-2.45-2.2 GM/100ML VI SOLN
500.0000 mL | Status: DC
  Administered 2016-11-13: 500 mL via INTRAVENOUS
  Filled 2016-11-13 (×2): qty 500

## 2016-11-13 MED ORDER — CALCIUM CARBONATE ANTACID 500 MG PO CHEW
2.0000 | CHEWABLE_TABLET | ORAL | Status: AC
  Administered 2016-11-13: 400 mg via ORAL
  Filled 2016-11-13 (×2): qty 2

## 2016-11-13 MED ORDER — CALCIUM CARBONATE ANTACID 500 MG PO CHEW: 2.0000 | CHEWABLE_TABLET | ORAL | Status: DC

## 2016-11-13 MED ORDER — DIPHENHYDRAMINE HCL 25 MG PO CAPS: 25.0000 mg | ORAL_CAPSULE | Freq: Four times a day (QID) | ORAL | Status: DC | PRN

## 2016-11-13 MED ORDER — ACETAMINOPHEN 325 MG PO TABS
650.0000 mg | ORAL_TABLET | ORAL | Status: DC | PRN
  Administered 2016-11-19: 650 mg via ORAL
  Filled 2016-11-13: qty 2

## 2016-11-13 MED ORDER — HEPARIN SODIUM (PORCINE) 1000 UNIT/ML IJ SOLN
3000.0000 [IU] | INTRAMUSCULAR | Status: AC
  Administered 2016-11-13: 3000 [IU] via INTRAVENOUS
  Filled 2016-11-13: qty 3

## 2016-11-13 MED ORDER — SODIUM CHLORIDE 0.9 % IV SOLN: Freq: Once | INTRAVENOUS | Status: DC

## 2016-11-13 MED ORDER — TRAMADOL HCL 50 MG PO TABS
50.0000 mg | ORAL_TABLET | Freq: Four times a day (QID) | ORAL | Status: DC | PRN
  Administered 2016-11-19 – 2016-11-21 (×2): 50 mg via ORAL
  Filled 2016-11-13 (×2): qty 1

## 2016-11-13 MED ORDER — HEPARIN SODIUM (PORCINE) 1000 UNIT/ML IJ SOLN: 1000.0000 [IU] | Freq: Once | INTRAMUSCULAR | Status: DC

## 2016-11-13 MED ORDER — HEPARIN SODIUM (PORCINE) 1000 UNIT/ML IJ SOLN
2000.0000 [IU] | INTRAMUSCULAR | Status: AC
  Administered 2016-11-13: 2000 [IU] via INTRAVENOUS
  Filled 2016-11-13: qty 2

## 2016-11-13 MED ORDER — LISINOPRIL 10 MG PO TABS
10.0000 mg | ORAL_TABLET | Freq: Every day | ORAL | Status: DC
  Administered 2016-11-13 – 2016-11-23 (×11): 10 mg via ORAL
  Filled 2016-11-13 (×11): qty 1

## 2016-11-13 MED ORDER — ACD FORMULA A 0.73-2.45-2.2 GM/100ML VI SOLN: 500.0000 mL | Status: DC

## 2016-11-13 MED ORDER — SODIUM CHLORIDE 0.9 % IV SOLN
INTRAVENOUS | Status: DC
  Administered 2016-11-13 – 2016-11-14 (×2): via INTRAVENOUS

## 2016-11-13 MED ORDER — ACETAMINOPHEN 325 MG PO TABS: 650.0000 mg | ORAL_TABLET | ORAL | Status: DC | PRN

## 2016-11-13 MED ORDER — SODIUM CHLORIDE 0.9 % IV SOLN
INTRAVENOUS | Status: AC
  Administered 2016-11-13 (×3): via INTRAVENOUS_CENTRAL
  Filled 2016-11-13 (×3): qty 200

## 2016-11-13 MED ORDER — DIPHENHYDRAMINE HCL 25 MG PO CAPS
25.0000 mg | ORAL_CAPSULE | Freq: Four times a day (QID) | ORAL | Status: DC | PRN
  Administered 2016-11-13: 25 mg via ORAL
  Filled 2016-11-13: qty 1

## 2016-11-13 MED ORDER — ACD FORMULA A 0.73-2.45-2.2 GM/100ML VI SOLN
Status: AC
  Administered 2016-11-13: 17:00:00
  Filled 2016-11-13: qty 500

## 2016-11-13 MED ORDER — ACETAMINOPHEN 650 MG RE SUPP: 650.0000 mg | RECTAL | Status: DC | PRN

## 2016-11-13 MED ORDER — ENOXAPARIN SODIUM 40 MG/0.4ML ~~LOC~~ SOLN
40.0000 mg | SUBCUTANEOUS | Status: DC
  Administered 2016-11-13 – 2016-11-17 (×3): 40 mg via SUBCUTANEOUS
  Filled 2016-11-13 (×10): qty 0.4

## 2016-11-13 MED ORDER — DOCUSATE SODIUM 100 MG PO CAPS
100.0000 mg | ORAL_CAPSULE | Freq: Two times a day (BID) | ORAL | Status: DC
  Administered 2016-11-13 – 2016-11-17 (×9): 100 mg via ORAL
  Filled 2016-11-13 (×9): qty 1

## 2016-11-13 MED ORDER — ACETAMINOPHEN 160 MG/5ML PO SOLN: 650.0000 mg | ORAL | Status: DC | PRN

## 2016-11-13 MED ORDER — ACD FORMULA A 0.73-2.45-2.2 GM/100ML VI SOLN
Status: AC
  Filled 2016-11-13: qty 500

## 2016-11-13 NOTE — ED Notes (Signed)
Family at bedside. 

## 2016-11-13 NOTE — ED Notes (Signed)
MD at bedside. 

## 2016-11-13 NOTE — ED Notes (Signed)
Patient moved to hospital bed by this RN and Saralyn Pilar EDT.

## 2016-11-13 NOTE — ED Notes (Signed)
Pt assisted to eat breakfast

## 2016-11-13 NOTE — Progress Notes (Signed)
PROGRESS NOTE    Eduardo Burch  XVQ:[id number] DOB: Feb 09, 1941 DOA: 11/12/2016 PCP: Lavone Orn, MD   Brief Narrative: Eduardo Burch is a 76 y.o. male with medical history significant of the thecal HTN, prostate cancer s/p prostatectomy, ankylosing spondylitis, CIPD and Paget's disease. Patient presents with bilateral lower extremity weakness.   Assessment & Plan:   Principal Problem:   Lower extremity weakness Active Problems:   Hypertension, essential   H/O prostate cancer   Paget's disease of bone   CIDP (chronic inflammatory demyelinating polyneuropathy) (HCC)   Lower extremity weakness CIPD Recurrent symptoms -neurology recommendations: plasmapheresis -neurochecks  Dysphagia -SLP eval  Essential hypertension -continue lisinopril and metoprolol   DVT prophylaxis: Lovenox Code Status: Full code Family Communication: Wife at bedside Disposition Plan: Discharge pending neuro workup/treatment   Consultants:   Neurology  Procedures:   Plasmapheresis (planned to start 9/11 pending bed   Antimicrobials:  None    Subjective: Weak  Objective: Vitals:   11/13/16 0756 11/13/16 0936 11/13/16 1000 11/13/16 1200  BP: (!) 156/90 (!) 142/82 135/79 (!) 146/86  Pulse: 92 (!) 103 98 98  Resp: 20  15 20   Temp:      TempSrc:      SpO2: 96%  94% 99%  Weight:      Height:       No intake or output data in the 24 hours ending 11/13/16 1259 Filed Weights   11/12/16 1346  Weight: 71.2 kg (157 lb)    Examination:  General exam: Appears calm and comfortable Respiratory system: Clear to auscultation. Respiratory effort normal. Cardiovascular system: S1 & S2 heard, RRR. No murmurs. Gastrointestinal system: Abdomen is nondistended, soft and nontender. No organomegaly or masses felt. Normal bowel sounds heard. Central nervous system: Alert and oriented. 2/5 RLE and 3/5 LLE strength. 5/5 BUE strength Extremities: No edema. No calf tenderness Skin: No cyanosis. No  rashes Psychiatry: Judgement and insight appear normal. Mood & affect appropriate.     Data Reviewed: I have personally reviewed following labs and imaging studies  CBC:  Recent Labs Lab 11/12/16 2030 11/13/16 1031  WBC 11.9* 9.8  NEUTROABS 9.1*  --   HGB 15.4 15.0  HCT 45.3 44.1  MCV 95.4 96.3  PLT 454* 093*   Basic Metabolic Panel:  Recent Labs Lab 11/12/16 2030 11/13/16 1031  NA 133* 137  K 4.8 5.1  CL 101 104  CO2 23 27  GLUCOSE 100* 108*  BUN 29* 27*  CREATININE 0.96 0.79  CALCIUM 9.0 8.7*   GFR: Estimated Creatinine Clearance: 76.6 mL/min (by C-G formula based on SCr of 0.79 mg/dL). Liver Function Tests:  Recent Labs Lab 11/12/16 2030 11/13/16 1031  AST 60* 47*  ALT 82* 76*  ALKPHOS 59 59  BILITOT 0.8 0.7  PROT 6.5 6.4*  ALBUMIN 3.0* 3.0*   No results for input(s): LIPASE, AMYLASE in the last 168 hours. No results for input(s): AMMONIA in the last 168 hours. Coagulation Profile:  Recent Labs Lab 11/13/16 1031  INR 0.97   Cardiac Enzymes: No results for input(s): CKTOTAL, CKMB, CKMBINDEX, TROPONINI in the last 168 hours. BNP (last 3 results) No results for input(s): PROBNP in the last 8760 hours. HbA1C: No results for input(s): HGBA1C in the last 72 hours. CBG: No results for input(s): GLUCAP in the last 168 hours. Lipid Profile: No results for input(s): CHOL, HDL, LDLCALC, TRIG, CHOLHDL, LDLDIRECT in the last 72 hours. Thyroid Function Tests: No results for input(s): TSH, T4TOTAL, FREET4, T3FREE, THYROIDAB in  the last 72 hours. Anemia Panel:  Recent Labs  11/12/16 2025 11/12/16 2030  VITAMINB12  --  495  FOLATE 26.0  --    Sepsis Labs: No results for input(s): PROCALCITON, LATICACIDVEN in the last 168 hours.  No results found for this or any previous visit (from the past 240 hour(s)).       Radiology Studies: Dg Chest Portable 1 View  Result Date: 11/12/2016 CLINICAL DATA:  Bilateral leg numbness and cough. EXAM:  PORTABLE CHEST 1 VIEW COMPARISON:  10/05/2005 FINDINGS: The heart size and mediastinal contours are within normal limits. Both lungs are clear. The visualized skeletal structures are unremarkable. IMPRESSION: No active disease. Electronically Signed   By: Misty Stanley M.D.   On: 11/12/2016 19:41        Scheduled Meds: . aspirin  81 mg Oral Daily  . docusate sodium  100 mg Oral BID  . enoxaparin (LOVENOX) injection  40 mg Subcutaneous Q24H  . heparin  3,000 Units Intravenous STAT  . lisinopril  10 mg Oral Daily  . metoprolol succinate  25 mg Oral Daily   Continuous Infusions: . sodium chloride 75 mL/hr at 11/13/16 0447     LOS: 1 day     Cordelia Poche, MD [facility] 11/13/2016, 12:59 PM Pager: [phone]  If 7PM-7AM, please contact night-coverage [email or web address] Password [email or web address] 11/13/2016, 12:59 PM

## 2016-11-13 NOTE — ED Notes (Signed)
Verdene Lennert, RN contacted this RN regarding status of catheter placement for procedure, Verdene Lennert, RN advised that CCM is still in the process of placing pts catheter at this time

## 2016-11-13 NOTE — ED Notes (Signed)
Breakfast tray at bedside 

## 2016-11-13 NOTE — ED Notes (Signed)
Pt requesting to call his wife, pt given phone to do so

## 2016-11-13 NOTE — ED Notes (Signed)
Marni Griffon, NP with CCM at bedside to see patient

## 2016-11-13 NOTE — ED Notes (Signed)
Verdene Lennert, RN in Hemodialysis made aware that CCM is at bedside preparing to place dialysis catheter at this time.

## 2016-11-13 NOTE — ED Notes (Signed)
Verdene Lennert, RN from Hemodialysis contacted this RN regarding who will be placing catheter for patient to receive hemodialysis for plasma exchange, this RN advised Verdene Lennert, RN that I was unaware of plan for who will place pts catheter but that I would reach out to Neuro regarding this since they are the ordering group for procedure. Page sent to Etta Quill, PA with neuro in regards to above, Etta Quill advised this RN CCM would be placing catheter for pts procedure, he was unsure of time frame and advised this RN to reach out to CCM for further information.

## 2016-11-13 NOTE — ED Notes (Signed)
Radiology at bedside

## 2016-11-13 NOTE — ED Notes (Signed)
Verdene Lennert, RN in Hemodialysis made aware that Laurey Arrow, NP with CCM is finishing up placement of catheter, Verdene Lennert given report on patient and advised we would transport patient up to them once he is ready.

## 2016-11-13 NOTE — ED Notes (Signed)
Dr. Milinda Hirschfeld contacted this RN and stated that CCM will be placing catheter for Hemodialysis but time frame for procedure is uncertain right now. Verdene Lennert, RN in [place] made aware.

## 2016-11-13 NOTE — ED Notes (Signed)
Pt transported to hemodialysis  

## 2016-11-13 NOTE — ED Notes (Signed)
ED Charge RN made aware of placing needing to have dialysis catheter placed. Verdene Lennert, RN in hemo made aware that CCM will place catheter for procedure, advised Verdene Lennert that this RN would attempt to determine time frame of placement from CCM

## 2016-11-13 NOTE — ED Notes (Signed)
Pt reporting he is very uncomfortable on on stretcher, this RN advised pt we could get a hospital bed brought down for him to be more comfortable, secretary made aware of need for hospital bed for patient.

## 2016-11-13 NOTE — Procedures (Signed)
Central Venous dialysis Catheter Insertion Procedure Note Jen Eppinger [id number] [birth year]  Procedure: Insertion of Central Venous Catheter Indications: PLEX  Procedure Details Consent: Risks of procedure as well as the alternatives and risks of each were explained to the (patient/caregiver).  Consent for procedure obtained. Time Out: Verified patient identification, verified procedure, site/side was marked, verified correct patient position, special equipment/implants available, medications/allergies/relevent history reviewed, required imaging and test results available.  Performed  Maximum sterile technique was used including antiseptics, cap, gloves, gown, hand hygiene, mask and sheet. Skin prep: Chlorhexidine; local anesthetic administered A antimicrobial bonded/coated triple lumen catheter was placed in the right femoral vein due to multiple attempts, no other available access using the Seldinger technique.  ->attempted right IJ  ->was not able to advance guide wire.  ->developed small hematoma->pressure held and aborted that approach.    Evaluation Blood flow good Complications: No apparent complications Patient did tolerate procedure well. Chest X-ray ordered to verify placement.  CXR: pending.  Clementeen Graham 11/13/2016, 2:21 PM Erick Colace ACNP-BC [place] Pager # [phone] OR # [phone] if no answer  Required [place]  Lavon Paganini. Titus Mould, MD, [place] Pgr: [facility]

## 2016-11-13 NOTE — ED Notes (Signed)
Breakfast tray ordered per report from night shift RN Tori.

## 2016-11-13 NOTE — ED Notes (Signed)
Radiology called for portable cxray prior to taking pt to dialysis

## 2016-11-14 DIAGNOSIS — R7989 Other specified abnormal findings of blood chemistry: Secondary | ICD-10-CM

## 2016-11-14 DIAGNOSIS — D72829 Elevated white blood cell count, unspecified: Secondary | ICD-10-CM

## 2016-11-14 DIAGNOSIS — R945 Abnormal results of liver function studies: Secondary | ICD-10-CM

## 2016-11-14 DIAGNOSIS — R1310 Dysphagia, unspecified: Secondary | ICD-10-CM

## 2016-11-14 LAB — CBC WITH DIFFERENTIAL/PLATELET
Basophils Absolute: 0 10*3/uL (ref 0.0–0.1)
Basophils Relative: 0 %
EOS ABS: 0 10*3/uL (ref 0.0–0.7)
EOS PCT: 0 %
HCT: 43.9 % (ref 39.0–52.0)
Hemoglobin: 14.7 g/dL (ref 13.0–17.0)
LYMPHS ABS: 1.7 10*3/uL (ref 0.7–4.0)
Lymphocytes Relative: 14 %
MCH: 32.2 pg (ref 26.0–34.0)
MCHC: 33.5 g/dL (ref 30.0–36.0)
MCV: 96.3 fL (ref 78.0–100.0)
MONO ABS: 0.8 10*3/uL (ref 0.1–1.0)
MONOS PCT: 7 %
Neutro Abs: 9.6 10*3/uL — ABNORMAL HIGH (ref 1.7–7.7)
Neutrophils Relative %: 79 %
PLATELETS: 423 10*3/uL — AB (ref 150–400)
RBC: 4.56 MIL/uL (ref 4.22–5.81)
RDW: 15.2 % (ref 11.5–15.5)
WBC: 12.2 10*3/uL — ABNORMAL HIGH (ref 4.0–10.5)

## 2016-11-14 LAB — PROTEIN ELECTROPHORESIS, SERUM
A/G RATIO SPE: 0.9 (ref 0.7–1.7)
Albumin ELP: 3 g/dL (ref 2.9–4.4)
Alpha-1-Globulin: 0.2 g/dL (ref 0.0–0.4)
Alpha-2-Globulin: 1.2 g/dL — ABNORMAL HIGH (ref 0.4–1.0)
BETA GLOBULIN: 1.2 g/dL (ref 0.7–1.3)
GAMMA GLOBULIN: 0.9 g/dL (ref 0.4–1.8)
Globulin, Total: 3.4 g/dL (ref 2.2–3.9)
TOTAL PROTEIN ELP: 6.4 g/dL (ref 6.0–8.5)

## 2016-11-14 LAB — COMPREHENSIVE METABOLIC PANEL
ALK PHOS: 32 U/L — AB (ref 38–126)
ALT: 33 U/L (ref 17–63)
ANION GAP: 5 (ref 5–15)
AST: 28 U/L (ref 15–41)
Albumin: 3.7 g/dL (ref 3.5–5.0)
BUN: 11 mg/dL (ref 6–20)
CALCIUM: 8.2 mg/dL — AB (ref 8.9–10.3)
CO2: 24 mmol/L (ref 22–32)
Chloride: 109 mmol/L (ref 101–111)
Creatinine, Ser: 0.61 mg/dL (ref 0.61–1.24)
GFR calc non Af Amer: >60 mL/min (ref >60)
Glucose, Bld: 82 mg/dL (ref 65–99)
Potassium: 4 mmol/L (ref 3.5–5.1)
Sodium: 138 mmol/L (ref 135–145)
Total Bilirubin: 0.9 mg/dL (ref 0.3–1.2)
Total Protein: 5.4 g/dL — ABNORMAL LOW (ref 6.5–8.1)

## 2016-11-14 LAB — B. BURGDORFI ANTIBODIES

## 2016-11-14 LAB — MAGNESIUM: Magnesium: 2 mg/dL (ref 1.7–2.4)

## 2016-11-14 LAB — PHOSPHORUS: PHOSPHORUS: 2.3 mg/dL — AB (ref 2.5–4.6)

## 2016-11-14 MED ORDER — POTASSIUM PHOSPHATES 15 MMOLE/5ML IV SOLN
20.0000 mmol | Freq: Once | INTRAVENOUS | Status: DC
  Filled 2016-11-14: qty 6.67

## 2016-11-14 NOTE — Evaluation (Signed)
Clinical/Bedside Swallow Evaluation Patient Details  Name: Eduardo Burch MRN: [id number] Date of Birth: [birth year]  Today's Date: 11/14/2016 Time: SLP Start Time (ACUTE ONLY): 1140 SLP Stop Time (ACUTE ONLY): 1200 SLP Time Calculation (min) (ACUTE ONLY): 20 min  Past Medical History:  Past Medical History:  Diagnosis Date  . Constipated   . Elevated blood pressure reading   . 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 Hypercholesterolemia   . Hypertension   . Spondylosis    Past Surgical History:  Past Surgical History:  Procedure Laterality Date  . PROATATECTOMY     HPI:  76 y.o.right-handed Caucasian malewith hypertension, prostate cancer s/p prostatectomy, ankylosing spondylitis, and Paget's disease, has had extensive w/u at [facility] and [place], admitted to [facility] on 9/10 after fall and with progressively worsening leg weakness/GBS, dysphagia. Followed by Dr. 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 Seen by Dr. Constance Holster, ENT, 11/01/16 for hoarse voice, difficulty swallowing pills. Flexible endoscopy revealed normal pharynx, tremor in vocal folds. Pt has lost 50 lbs over last several months. Dr. Posey Pronto recommended MBS.    Assessment / Plan / Recommendation Clinical Impression  Pt presents with tremor in velum, tongue, and vocal symptoms of tremor.  Pt states that mastication is slower and requires more deliberation.  All solid/liquid materials lead to multiple sub-swallows per bolus. There were no overt s/s of aspiration.  Will proceed with MBS to further elucidate nature of swallowing deficits.  Pt agrees, but wishes to wait until next date.  Recommend continuing current diet pending study.  SLP Visit Diagnosis: Dysphagia, unspecified (R13.10)    Aspiration Risk       Diet Recommendation   soft diet, thin liquids  Medication Administration: Whole meds with liquid    Other  Recommendations Oral Care Recommendations:  Oral care BID ; MBS next date.   Follow up Recommendations        Frequency and Duration            Prognosis        Swallow Study   General HPI: 76 y.o.right-handed Caucasian malewith hypertension, prostate cancer s/p prostatectomy, ankylosing spondylitis, and Paget's disease, has had extensive w/u at [facility] and [place], admitted to [facility] on 9/10 after fall and with progressively worsening leg weakness/GBS, dysphagia. Followed by Dr. 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 Seen by Dr. Constance Holster, ENT, 11/01/16 for hoarse voice, difficulty swallowing pills. Flexible endoscopy revealed normal pharynx, tremor in vocal folds. Pt has lost 50 lbs over last several months. Dr. Posey Pronto recommended MBS.  Type of Study: Bedside Swallow Evaluation Previous Swallow Assessment: no Diet Prior to this Study: Thin liquids (soft) Temperature Spikes Noted: No Respiratory Status: Room air History of Recent Intubation: No Behavior/Cognition: Alert;Cooperative;Pleasant mood Oral Cavity Assessment: Within Functional Limits Oral Care Completed by SLP: No Oral Cavity - Dentition: Adequate natural dentition Vision: Functional for self-feeding Self-Feeding Abilities: Able to feed self Patient Positioning: Upright in bed Baseline Vocal Quality: Other (comment) (tremulous) Volitional Cough: Weak Volitional Swallow: Able to elicit    Oral/Motor/Sensory Function Overall Oral Motor/Sensory Function: Other (comment) (tremor of tongue, velum, jaw)   Ice Chips Ice chips: Within functional limits   Thin Liquid Thin Liquid: Impaired Presentation: Cup;Self Fed Pharyngeal  Phase Impairments: Multiple swallows    Nectar Thick Nectar Thick Liquid: Not tested   Honey Thick Honey Thick Liquid: Not tested   Puree Puree: Impaired Presentation: Spoon;Self Fed Pharyngeal Phase  Impairments: Multiple swallows   Solid   GO   Solid: Impaired Presentation: Self  Fed Pharyngeal Phase Impairments: Multiple swallows        Eduardo Burch 11/14/2016,12:18 PM

## 2016-11-14 NOTE — Progress Notes (Signed)
Rehab Admissions Coordinator Note:  Patient was screened by Cleatrice Burke for appropriateness for an Inpatient Acute Rehab Consult per PT recommendation. Noted plans for plasmapheresis. I will follow his progress to assist with planning dispo as he progress.Cleatrice Burke 11/14/2016, 2:52 PM  I can be reached at [phone].

## 2016-11-14 NOTE — Evaluation (Signed)
Occupational Therapy Evaluation Patient Details Name: Eduardo Burch MRN: [id number] DOB: 07-Aug-1940 Today's Date: 11/14/2016    History of Present Illness Eduardo Burch is a 76 y.o. malewith medical history significant of the thecal HTN, prostate cancer s/p prostatectomy, ankylosing spondylitis, CIPD and Paget's disease(associated symptoms include difficulty swallowing, fevers, chills, night sweats, 50 pounds weight loss over last 4 months). Patient presents with bilateral lower extremity weakness resulting in a fall.   Clinical Impression   PTA was independent in ADL and mobility. Pt goes to the gym 3 times a week, works part time, drives, very functional and independent. Pt is currently max A +2 for functional transfers and set up for seated grooming tasks/mod A for LB ADL. Pt verbalized multiple times that he is very motivated and wants to return to PLOF. Pt will benefit from skilled OT in the acute setting and afterwards at comprehensive rehab level (CIR) to maximize safety and independence in ADL and functional transfers as well as caregiver education, DME/AE, energy conservation, and overall strengthening to be applied to meaningful occupations. Next session to establish HEP for BUE to assist with transfers and continue functional ADL training.     Follow Up Recommendations  CIR;Supervision/Assistance - 24 hour    Equipment Recommendations  Other (comment) (defer to next venue)    Recommendations for Other Services Rehab consult     Precautions / Restrictions Precautions Precautions: Fall Restrictions Weight Bearing Restrictions: No      Mobility Bed Mobility Overal bed mobility: Needs Assistance Bed Mobility: Supine to Sit     Supine to sit: Mod assist;+2 for physical assistance;HOB elevated     General bed mobility comments: modAx2 for LE management to floor and trunk to upright and steadying in seated   Transfers Overall transfer level: Needs assistance Equipment used:  Rolling walker (2 wheeled);2 person hand held assist Transfers: Sit to/from [employer] Sit to Stand: Mod assist;+2 physical assistance;From elevated surface   Squat pivot transfers: Max assist;+2 physical assistance;From elevated surface     General transfer comment: modAx2 for sit<> for power up and steadying in RW, vc for anterior pelvic tilt and chest forward, pt able to stand 15 sec before sitting mod Ax2 for lowering to bed, maxAx2 for squat pivot transfer to recliner, pt able make 2 small steps towards recliner but never came to fully upright.     Balance Overall balance assessment: Needs assistance Sitting-balance support: Feet supported;Bilateral upper extremity supported Sitting balance-Leahy Scale: Poor Sitting balance - Comments: initially required assist to maintain and progressed to static sitting   Standing balance support: Bilateral upper extremity supported Standing balance-Leahy Scale: Zero                             ADL either performed or assessed with clinical judgement   ADL Overall ADL's : Needs assistance/impaired Eating/Feeding: Modified independent;Sitting   Grooming: Wash/dry hands;Wash/dry face;Set up;Sitting Grooming Details (indicate cue type and reason): in recliner Upper Body Bathing: Minimal assistance;Sitting   Lower Body Bathing: Moderate assistance;Sitting/lateral leans   Upper Body Dressing : Set up;Sitting   Lower Body Dressing: Moderate assistance;Sitting/lateral leans Lower Body Dressing Details (indicate cue type and reason): to don socks BLE Toilet Transfer: Maximal assistance;+2 for physical assistance;+2 for safety/equipment;Squat-pivot;BSC (2 HHA, no RW for safety) Toilet Transfer Details (indicate cue type and reason): simulated through recliner transfer [place] and Hygiene: Moderate assistance;Sitting/lateral lean       Functional mobility  during ADLs: Maximal assistance;+2 for  physical assistance;+2 for safety/equipment (stand pivot only) General ADL Comments: Pt fatigues very quickly, impacting ability to perform ADL     Vision Baseline Vision/History: Wears glasses Wears Glasses: Reading only Patient Visual Report: Blurring of vision Vision Assessment?: Yes Eye Alignment: Within Functional Limits Ocular Range of Motion: Within Functional Limits Alignment/Gaze Preference: Within Defined Limits Tracking/Visual Pursuits: Able to track stimulus in all quads without difficulty Convergence: Within functional limits Visual Fields: No apparent deficits Diplopia Assessment:  (denies) Additional Comments: Pt states that his vision is blurry now     Perception     Praxis      Pertinent Vitals/Pain Pain Assessment: No/denies pain     Hand Dominance Right   Extremity/Trunk Assessment Upper Extremity Assessment Upper Extremity Assessment: Generalized weakness   Lower Extremity Assessment Lower Extremity Assessment: Defer to PT evaluation RLE Deficits / Details: ROM WFL, strength grossly 2/5 RLE Coordination: decreased gross motor;decreased fine motor LLE Deficits / Details: ROM WFL, strength grossly 3-/5 LLE Coordination: decreased fine motor;decreased gross motor   Cervical / Trunk Assessment Cervical / Trunk Assessment: Normal   Communication Communication Communication: HOH   Cognition Arousal/Alertness: Awake/alert Behavior During Therapy: WFL for tasks assessed/performed Overall Cognitive Status: Within Functional Limits for tasks assessed                                     General Comments  HR max of 133bpm with activity all other vitals stable during treatment    Exercises     Shoulder Instructions      Home Living Family/patient expects to be discharged to:: Private residence Living Arrangements: Spouse/significant other Available Help at Discharge: Family;Available 24 hours/day Type of Home: House Home Access:  Stairs to enter [employer] of Steps: 5 Entrance Stairs-Rails: Right;Left Home Layout: Two level;Able to live on main level with bedroom/bathroom Alternate Level Stairs-Number of Steps: flight   Bathroom Shower/Tub: Occupational psychologist: Handicapped height Bathroom Accessibility: Yes How Accessible: Accessible via wheelchair Home Equipment: Shower seat          Prior Functioning/Environment Level of Independence: Independent        Comments: Patient works part-time at his company (desk job); used to be race Chief Strategy Officer Problem List: Decreased strength;Impaired balance (sitting and/or standing);Decreased range of motion;Decreased activity tolerance;Impaired vision/perception;Decreased coordination;Decreased safety awareness;Decreased knowledge of use of DME or AE      OT Treatment/Interventions: Self-care/ADL training;Therapeutic exercise;Neuromuscular education;Energy conservation;DME and/or AE instruction;Therapeutic activities;Visual/perceptual remediation/compensation;Patient/family education;Balance training    OT Goals(Current goals can be found in the care plan section) Acute Rehab OT Goals Patient Stated Goal: get back to normal OT Goal Formulation: With patient Time For Goal Achievement: 11/28/16 Potential to Achieve Goals: Good ADL Goals Pt Will Perform Grooming: with min guard assist;standing Pt Will Perform Upper Body Bathing: with modified independence;sitting Pt Will Perform Lower Body Bathing: with modified independence;sitting/lateral leans Pt Will Transfer to Toilet: with min assist;bedside commode (with caregiver independent in assisting; with RW) Pt Will Perform Toileting - Clothing Manipulation and hygiene: with modified independence;sitting/lateral leans Pt/caregiver will Perform Home Exercise Program: Increased strength;Both right and left upper extremity;With written HEP provided Additional ADL Goal #1: Pt will perform  bed mobility at supervision level prior to participation in ADL activity  OT Frequency: Min 3X/week   Barriers to D/C:  good family support       Co-evaluation PT/OT/SLP Co-Evaluation/Treatment: Yes Reason for Co-Treatment: Complexity of the patient's impairments (multi-system involvement);For patient/therapist safety;To address functional/ADL transfers PT goals addressed during session: Mobility/safety with mobility;Balance OT goals addressed during session: ADL's and self-care      AM-PAC PT "6 Clicks" Daily Activity     Outcome Measure Help from another person eating meals?: None Help from another person taking care of personal grooming?: A Little Help from another person toileting, which includes using toliet, bedpan, or urinal?: A Lot Help from another person bathing (including washing, rinsing, drying)?: A Little Help from another person to put on and taking off regular upper body clothing?: A Little Help from another person to put on and taking off regular lower body clothing?: A Lot 6 Click Score: 17   End of Session Equipment Utilized During Treatment: Gait belt;Rolling walker Nurse Communication: Need for lift equipment;Mobility status  Activity Tolerance: Patient tolerated treatment well Patient left: in chair;with call bell/phone within reach;with chair alarm set;with family/visitor present  OT Visit Diagnosis: Unsteadiness on feet (R26.81);Other abnormalities of gait and mobility (R26.89);Muscle weakness (generalized) (M62.81);Other symptoms and signs involving the nervous system (R29.898)                Time: 1335-1420 OT Time Calculation (min): 45 min Charges:  OT General Charges $OT Visit: 1 Visit OT Evaluation $OT Eval Moderate Complexity: 1 Mod G-Codes:     Hulda Humphrey OTR/L [place] 11/14/2016, 3:28 PM

## 2016-11-14 NOTE — Progress Notes (Signed)
PROGRESS NOTE    Eduardo Burch  QPR:[id number] DOB: 1940/05/06 DOA: 11/12/2016 PCP: Lavone Orn, MD   Brief Narrative:  Eduardo Burch is a 76 y.o. malewith medical history significant of the Essential HTN, Prostate cancer s/p prostatectomy, ankylosing spondylitis, CIPD and Paget's disease. Patient presents with bilateral lower extremity weakness. Patient Unable to ambulate now. Patient has had extensive workup including MRI, PET scan, and EMG studies between here and [facility]. 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 Previously tried on IVIG. Other working differentials included paraneoplastic syndrome or chronic inflammatory polyradiculopathy. Neuorlogy consulted and evaluated and recommending Plasmapheresis (PLEX) for 5 days.   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  -Recently had IVIG -Neurology Consulted and recommending 5 day course of Plasmapheresis -Labs sent including ESR (47), CRP (<0.8), vitamin B12 was 495, vitamin B1, copper, folate was 26.0, SPEP with IFE, GM1 antibody, Lyme Negative, and paraneoplastic antibody panel -C/w Neurochecks q6h -Appreciate Neurology evaluation   Dysphagia -SLP evaluated Recommending Soft Diet Thin Liquids  -MBS to be done  Essential Hypertension -C/w Metoprolol 25 mg po Daily and Lisinopril 10 mg po Daily  Hx of Prostate Cancer and Paget's Disease  -Follow up with PCP and Oncology as an outpatient;  -Recent PET Scan in June did not show Malignancy  -Paraneoplastic Antibody Panel Sent -Outpatient Neurologist would favor repeating Malignancy Evaluation  -Will discuss with  Oncology in AM   Hypophosphatemia -Phos Level was 2.3 this AM -Replete with 20 mmol of KPhos IV -Continue to Monitor and Replete as Necessary -Repeat Phos Level in AM   Leukocytosis -Mild. WBC went from 11.9 -> 9.8 -> 12.2 -Suspect Reactive  -Continue to Monitor for S/Sx of Infection; If worsens will Pan-Culture -Repeat CBC in AM   Abnormal LFT's -Improved -Continue to Trend  DVT prophylaxis: Enoxaparin 40 mg sq q24h Code Status: FULL CODE Family Communication: Discussed with Family at bedside  Disposition Plan: Possible CIR  Consultants:   Neurology   Procedures:  None   Antimicrobials:  Anti-infectives    None     Subjective: Seen and examined and stated he felt as if weakness was slightly better as he stated he was not able to lift his leg. No nausea or vomiting. No CP or SOB. Had some tremors.   Objective: Vitals:   11/14/16 0815 11/14/16 1146 11/14/16 1627 11/14/16 1632  BP:  132/89  (!) 150/81  Pulse: (!) 101   90  Resp: (!) 25 18 (!) 23 (!) 24  Temp: 97.8 F (36.6 C)   97.8 F (36.6 C)  TempSrc: Oral   Oral  SpO2: 98%   96%  Weight:      Height:        Intake/Output Summary (Last 24 hours) at 11/14/16 1728 Last data filed at 11/14/16 1409  Gross per 24 hour  Intake              240 ml  Output              775 ml  Net             -535 ml   [employer]  11/12/16 1346 11/14/16 0329  Weight: 71.2 kg (157 lb) 72.4 kg (159 lb 9.6 oz)   Examination: Physical Exam:  Constitutional: Caucasian male in NAD and appears calm and comfortable Eyes: Lids and conjunctivae normal, sclerae anicteric  ENMT: External Ears, Nose appear normal. Grossly normal hearing. Mucous membranes are moist. Neck: Appears normal, supple, no cervical masses, normal ROM, no appreciable thyromegaly, no JVD Respiratory: Diminished to auscultation bilaterally, no wheezing, rales, rhonchi or crackles. Normal respiratory effort and patient is not tachypenic. No accessory  muscle use.  Cardiovascular: RRR, no murmurs / rubs / gallops. S1 and S2 auscultated. No extremity edema.  Abdomen: Soft, non-tender, non-distended. No masses palpated. No appreciable hepatosplenomegaly. Bowel sounds positive.  GU: Deferred. Musculoskeletal: No clubbing / cyanosis of digits/nails. No joint deformity upper and lower extremities.  Skin: No rashes, lesions, ulcers or lesions on a limited skin. No induration; Warm and dry.  Neurologic: CN 2-12 grossly intact with no focal deficits. Decreased Strength in LE; 4/5 Bilateral UE Strength. Romberg sign cerebellar reflexes not assessed.  Psychiatric: Normal judgment and insight. Alert and oriented x 3. Normal mood and appropriate affect.   Data Reviewed: I have personally reviewed following labs and imaging studies  CBC:  Recent Labs Lab 11/12/16 2030 11/13/16 1031 11/13/16 1512 11/14/16 0954  WBC 11.9* 9.8  --  12.2*  NEUTROABS 9.1*  --   --  9.6*  HGB 15.4 15.0 14.3 14.7  HCT 45.3 44.1 42.0 43.9  MCV 95.4 96.3  --  96.3  PLT 454* 443*  --  834*   Basic Metabolic Panel:  Recent Labs Lab 11/12/16 2030 11/13/16 1031 11/13/16 1512 11/14/16 1139  NA 133* 137 139 138  K 4.8 5.1 4.1 4.0  CL 101 104 103 109  CO2 23 27  --  24  GLUCOSE 100* 108* 104* 82  BUN 29* 27* 26* 11  CREATININE 0.96 0.79 0.60* 0.61  CALCIUM 9.0 8.7*  --  8.2*  MG  --   --   --  2.0  PHOS  --   --   --  2.3*   GFR: Estimated Creatinine Clearance: 77.9 mL/min (by C-G formula based on SCr of 0.61 mg/dL). Liver Function Tests:  Recent Labs Lab 11/12/16 2030 11/13/16 1031 11/14/16 1139  AST 60* 47* 28  ALT 82* 76* 33  ALKPHOS 59 59 32*  BILITOT 0.8 0.7 0.9  PROT 6.5 6.4* 5.4*  ALBUMIN 3.0* 3.0* 3.7   No results for input(s): LIPASE, AMYLASE in the last 168 hours. No results for input(s): AMMONIA in the last 168 hours. Coagulation Profile:  Recent Labs Lab 11/13/16 1031  INR 0.97   Cardiac Enzymes: No results for input(s):  CKTOTAL, CKMB, CKMBINDEX, TROPONINI in the last 168 hours. BNP (last 3 results) No results for input(s): PROBNP in the last 8760 hours. HbA1C: No results for input(s): HGBA1C in the last 72 hours. CBG: No results for input(s): GLUCAP in the last 168 hours. Lipid Profile: No results for input(s): CHOL, HDL, LDLCALC, TRIG, CHOLHDL, LDLDIRECT in the last 72 hours. Thyroid Function Tests: No results for input(s): TSH, T4TOTAL, FREET4, T3FREE, THYROIDAB in the last 72 hours. Anemia Panel:  Recent Labs  11/12/16 2025 11/12/16 2030  VITAMINB12  --  495  FOLATE 26.0  --    Sepsis Labs: No results for input(s): PROCALCITON, LATICACIDVEN in the last 168 hours.  No results found for this or any previous visit (from the past 240 hour(s)).   Radiology Studies: Dg  Chest Port 1 View  Result Date: 11/13/2016 CLINICAL DATA:  R/O pneumothorax. Encounter for central line placement. Hx of HTN. Pt is a former smoker. EXAM: PORTABLE CHEST 1 VIEW COMPARISON:  11/12/2016 FINDINGS: Despite the history, it no central line is identified. There is no pneumothorax. By history not right IJ line was attempted but not placed. Patient has a new right femoral line. The heart size and mediastinal contours are within normal limits. Both lungs are clear. The visualized skeletal structures are unremarkable. IMPRESSION: No active disease.  Central line not identified. Electronically Signed   By: Nolon Nations M.D.   On: 11/13/2016 14:51   Dg Chest Portable 1 View  Result Date: 11/12/2016 CLINICAL DATA:  Bilateral leg numbness and cough. EXAM: PORTABLE CHEST 1 VIEW COMPARISON:  10/05/2005 FINDINGS: The heart size and mediastinal contours are within normal limits. Both lungs are clear. The visualized skeletal structures are unremarkable. IMPRESSION: No active disease. Electronically Signed   By: Misty Stanley M.D.   On: 11/12/2016 19:41   Scheduled Meds: . aspirin  81 mg Oral Daily  . docusate sodium  100 mg Oral BID    . enoxaparin (LOVENOX) injection  40 mg Subcutaneous Q24H  . heparin  1,000 Units Intracatheter Once  . lisinopril  10 mg Oral Daily  . metoprolol succinate  25 mg Oral Daily   Continuous Infusions: . sodium chloride 75 mL/hr at 11/14/16 0216  . citrate dextrose    . potassium PHOSPHATE IVPB (mmol)      LOS: 2 days   Kerney Elbe, DO [facility] Pager [phone]  If 7PM-7AM, please contact night-coverage [email or web address] Password [facility] 11/14/2016, 5:28 PM

## 2016-11-14 NOTE — Evaluation (Signed)
Physical Therapy Evaluation Patient Details Name: Eduardo Burch MRN: [id number] DOB: 1940-10-28 Today's Date: 11/14/2016   History of Present Illness  Brentt Fread is a 76 y.o. malewith medical history significant of the thecal HTN, prostate cancer s/p prostatectomy, ankylosing spondylitis, CIPD and Paget's disease(associated symptoms include difficulty swallowing, fevers, chills, night sweats, 50 pounds weight loss over last 4 months). Patient presents with bilateral lower extremity weakness resulting in a fall.  Clinical Impression  Pt admitted with above diagnosis. Pt currently with functional limitations due to the deficits listed below (see PT Problem List). Pt is limited by weakness and is currently modAx2 for bed mobility, modAx2 for 1x sit<>stand and maxAx2 for squat pivot transfer to chair.  Pt will benefit from skilled PT to increase their independence and safety with mobility to allow discharge to the venue listed below.       Follow Up Recommendations CIR    Equipment Recommendations  Other (comment) (to be determined at next venue)    Recommendations for Other Services Rehab consult     Precautions / Restrictions Precautions Precautions: Fall Restrictions Weight Bearing Restrictions: No      Mobility  Bed Mobility Overal bed mobility: Needs Assistance Bed Mobility: Supine to Sit     Supine to sit: Mod assist;+2 for physical assistance;HOB elevated     General bed mobility comments: modAx2 for LE management to floor and trunk to upright and steadying in seated   Transfers Overall transfer level: Needs assistance Equipment used: Rolling walker (2 wheeled);2 person hand held assist Transfers: Sit to/from [employer] Sit to Stand: Mod assist;+2 physical assistance;From elevated surface   Squat pivot transfers: Max assist;+2 physical assistance;From elevated surface     General transfer comment: modAx2 for sit<> for power up and steadying in RW, vc  for anterior pelvic tilt and chest forward, pt able to stand 15 sec before sitting mod Ax2 for lowering to bed, maxAx2 for squat pivot transfer to recliner, pt able make 2 small steps towards recliner but never came to fully upright.       Balance Overall balance assessment: Needs assistance Sitting-balance support: Feet supported;Bilateral upper extremity supported Sitting balance-Leahy Scale: Poor Sitting balance - Comments: initially required assist to maintain and progressed to static sitting   Standing balance support: Bilateral upper extremity supported Standing balance-Leahy Scale: Zero                               Pertinent Vitals/Pain      Home Living Family/patient expects to be discharged to:: Private residence Living Arrangements: Spouse/significant other Available Help at Discharge: Family;Available 24 hours/day Type of Home: House Home Access: Stairs to enter Entrance Stairs-Rails: Psychiatric nurse of Steps: 5 Home Layout: Two level;Able to live on main level with bedroom/bathroom        Prior Function Level of Independence: Independent         Comments: Patient works part-time at his company (desk job);      Hand Dominance   Dominant Hand: Right    Extremity/Trunk Assessment        Lower Extremity Assessment RLE Deficits / Details: ROM WFL, strength grossly 2/5 RLE Coordination: decreased gross motor;decreased fine motor LLE Deficits / Details: ROM WFL, strength grossly 3-/5 LLE Coordination: decreased fine motor;decreased gross motor    Cervical / Trunk Assessment Cervical / Trunk Assessment: Normal  Communication   Communication: HOH  Cognition Arousal/Alertness: Awake/alert Behavior During  Therapy: WFL for tasks assessed/performed Overall Cognitive Status: Within Functional Limits for tasks assessed                                        General Comments General comments (skin integrity,  edema, etc.): HR max of 133bpm with activity all other vitals stable during treatment        Assessment/Plan    PT Assessment Patient needs continued PT services  PT Problem List Decreased strength;Decreased activity tolerance;Decreased balance;Decreased mobility;Decreased coordination       PT Treatment Interventions Gait training;DME instruction;Functional mobility training;Therapeutic activities;Therapeutic exercise;Balance training;Neuromuscular re-education;Patient/family education    PT Goals (Current goals can be found in the Care Plan section)  Acute Rehab PT Goals Patient Stated Goal: get back to normal PT Goal Formulation: With patient Time For Goal Achievement: 11/28/16 Potential to Achieve Goals: Fair    Frequency Min 3X/week     Co-evaluation PT/OT/SLP Co-Evaluation/Treatment: Yes   PT goals addressed during session: Mobility/safety with mobility;Balance         AM-PAC PT "6 Clicks" Daily Activity  Outcome Measure Difficulty turning over in bed (including adjusting bedclothes, sheets and blankets)?: Unable Difficulty moving from lying on back to sitting on the side of the bed? : Unable Difficulty sitting down on and standing up from a chair with arms (e.g., wheelchair, bedside commode, etc,.)?: Unable Help needed moving to and from a bed to chair (including a wheelchair)?: A Lot Help needed walking in hospital room?: Total Help needed climbing 3-5 steps with a railing? : Total 6 Click Score: 7    End of Session Equipment Utilized During Treatment: Gait belt Activity Tolerance: Patient tolerated treatment well Patient left: in chair;with call bell/phone within reach;with chair alarm set Nurse Communication: Mobility status PT Visit Diagnosis: Unsteadiness on feet (R26.81);Other abnormalities of gait and mobility (R26.89);Muscle weakness (generalized) (M62.81);Difficulty in walking, not elsewhere classified (R26.2);Other symptoms and signs involving the  nervous system (R29.898)    Time: 1335-1420 PT Time Calculation (min) (ACUTE ONLY): 45 min   Charges:   PT Evaluation $PT Eval Moderate Complexity: 1 Mod PT Treatments $Therapeutic Activity: 8-22 mins   PT G Codes:        Eyal Greenhaw B. Migdalia Dk PT, DPT Acute Rehabilitation  [phone] Pager [phone]    [place] 11/14/2016, 2:45 PM

## 2016-11-14 NOTE — Progress Notes (Signed)
Patient continues to be very anxious with a lot of tremors, could not stand or pull up in the bed.BP also elevated  And patient said that is not abnormal.

## 2016-11-15 ENCOUNTER — Inpatient Hospital Stay (HOSPITAL_COMMUNITY): Payer: Medicare Other

## 2016-11-15 ENCOUNTER — Ambulatory Visit: Payer: Medicare Other | Admitting: Physical Therapy

## 2016-11-15 DIAGNOSIS — G6181 Chronic inflammatory demyelinating polyneuritis: Principal | ICD-10-CM

## 2016-11-15 LAB — COMPREHENSIVE METABOLIC PANEL
ALT: 34 U/L (ref 17–63)
AST: 28 U/L (ref 15–41)
Albumin: 3.5 g/dL (ref 3.5–5.0)
Alkaline Phosphatase: 38 U/L (ref 38–126)
Anion gap: 6 (ref 5–15)
BILIRUBIN TOTAL: 0.9 mg/dL (ref 0.3–1.2)
BUN: 11 mg/dL (ref 6–20)
CHLORIDE: 108 mmol/L (ref 101–111)
CO2: 24 mmol/L (ref 22–32)
CREATININE: 0.49 mg/dL — AB (ref 0.61–1.24)
Calcium: 8.2 mg/dL — ABNORMAL LOW (ref 8.9–10.3)
Glucose, Bld: 108 mg/dL — ABNORMAL HIGH (ref 65–99)
POTASSIUM: 3.8 mmol/L (ref 3.5–5.1)
Sodium: 138 mmol/L (ref 135–145)
TOTAL PROTEIN: 5.5 g/dL — AB (ref 6.5–8.1)

## 2016-11-15 LAB — CBC WITH DIFFERENTIAL/PLATELET
Basophils Absolute: 0 10*3/uL (ref 0.0–0.1)
Basophils Relative: 0 %
EOS PCT: 1 %
Eosinophils Absolute: 0.1 10*3/uL (ref 0.0–0.7)
HEMATOCRIT: 43.8 % (ref 39.0–52.0)
Hemoglobin: 14.6 g/dL (ref 13.0–17.0)
LYMPHS ABS: 1.6 10*3/uL (ref 0.7–4.0)
LYMPHS PCT: 16 %
MCH: 32 pg (ref 26.0–34.0)
MCHC: 33.3 g/dL (ref 30.0–36.0)
MCV: 96.1 fL (ref 78.0–100.0)
MONO ABS: 1 10*3/uL (ref 0.1–1.0)
Monocytes Relative: 10 %
Neutro Abs: 7.2 10*3/uL (ref 1.7–7.7)
Neutrophils Relative %: 73 %
PLATELETS: 386 10*3/uL (ref 150–400)
RBC: 4.56 MIL/uL (ref 4.22–5.81)
RDW: 15.2 % (ref 11.5–15.5)
WBC: 9.9 10*3/uL (ref 4.0–10.5)

## 2016-11-15 LAB — COPPER, SERUM: COPPER: 108 ug/dL (ref 72–166)

## 2016-11-15 LAB — VITAMIN B1: Vitamin B1 (Thiamine): 139.3 nmol/L (ref 66.5–200.0)

## 2016-11-15 LAB — PHOSPHORUS: PHOSPHORUS: 2.5 mg/dL (ref 2.5–4.6)

## 2016-11-15 LAB — MAGNESIUM: MAGNESIUM: 1.9 mg/dL (ref 1.7–2.4)

## 2016-11-15 MED ORDER — DIPHENHYDRAMINE HCL 25 MG PO CAPS: 25.0000 mg | ORAL_CAPSULE | Freq: Four times a day (QID) | ORAL | Status: DC | PRN

## 2016-11-15 MED ORDER — HYDRALAZINE HCL 20 MG/ML IJ SOLN: 10.0000 mg | Freq: Four times a day (QID) | INTRAMUSCULAR | Status: DC | PRN

## 2016-11-15 MED ORDER — SODIUM CHLORIDE 0.9 % IV SOLN
INTRAVENOUS | Status: AC
  Administered 2016-11-15 (×2): via INTRAVENOUS_CENTRAL
  Filled 2016-11-15 (×3): qty 200

## 2016-11-15 MED ORDER — HEPARIN SODIUM (PORCINE) 1000 UNIT/ML IJ SOLN
1000.0000 [IU] | Freq: Once | INTRAMUSCULAR | Status: DC
  Filled 2016-11-15: qty 1

## 2016-11-15 MED ORDER — ACD FORMULA A 0.73-2.45-2.2 GM/100ML VI SOLN
Status: AC
  Administered 2016-11-15: 500 mL via INTRAVENOUS
  Filled 2016-11-15: qty 500

## 2016-11-15 MED ORDER — CALCIUM CARBONATE ANTACID 500 MG PO CHEW
CHEWABLE_TABLET | ORAL | Status: AC
  Filled 2016-11-15: qty 4

## 2016-11-15 MED ORDER — ACETAMINOPHEN 325 MG PO TABS: 650.0000 mg | ORAL_TABLET | ORAL | Status: DC | PRN

## 2016-11-15 MED ORDER — SODIUM CHLORIDE 0.9 % IV SOLN: INTRAVENOUS | Status: DC

## 2016-11-15 MED ORDER — CALCIUM CARBONATE ANTACID 500 MG PO CHEW
2.0000 | CHEWABLE_TABLET | ORAL | Status: AC
  Administered 2016-11-15: 400 mg via ORAL

## 2016-11-15 MED ORDER — SODIUM CHLORIDE 0.9 % IV SOLN
2.0000 g | Freq: Once | INTRAVENOUS | Status: AC
  Administered 2016-11-15: 2 g via INTRAVENOUS
  Filled 2016-11-15 (×2): qty 20

## 2016-11-15 MED ORDER — ACD FORMULA A 0.73-2.45-2.2 GM/100ML VI SOLN
Status: AC
  Filled 2016-11-15: qty 500

## 2016-11-15 MED ORDER — ACD FORMULA A 0.73-2.45-2.2 GM/100ML VI SOLN
500.0000 mL | Status: DC
  Administered 2016-11-15: 500 mL via INTRAVENOUS
  Filled 2016-11-15: qty 500

## 2016-11-15 NOTE — Progress Notes (Signed)
Modified Barium Swallow Progress Note  Patient Details  Name: Ariv Penrod MRN: [id number] Date of Birth: Jun 25, 1940  Today's Date: 11/15/2016  Modified Barium Swallow completed.  Full report located under Chart Review in the Imaging Section.  Brief recommendations include the following:  Clinical Impression  Pt demonstrates a mild dysphagia with adequate airway protection with all trials given pts careful intake and independent use of compensatory strategies. Lingual tremor noted but does not interfere with bolus manipulation. Pt briefly holds liquids orally and sometimes piecemeal transits portions of bolus. Expect that premature spillage leading to trace penetration events has made pt attentive to oral control with a resulting "hold and swallow" behavior to avoid coughing/choking. Pharyngeal phase otherwise adequate though suspect mild base of tongue weakness given trace sensed epigltotic residue. Recommend pt continue current diet and strategies. WIll f/u to intiaite a trial of repiratory muscle strength training to improve inspiratory and expiratory muscles involved in swallow and cough in setting of neuromuscular disease and potential for decline.    Swallow Evaluation Recommendations       SLP Diet Recommendations: Regular solids;Thin liquid   Liquid Administration via: Cup;Straw   Medication Administration: Whole meds with puree   Supervision: Patient able to self feed   Compensations: Slow rate;Small sips/bites;Minimize environmental distractions   Postural Changes: Remain semi-upright after after feeds/meals (Comment)   Oral Care Recommendations: Oral care BID        Aiman Sonn, Katherene Ponto 11/15/2016,1:39 PM

## 2016-11-15 NOTE — Care Management Note (Signed)
Case Management Note Marvetta Gibbons RN, BSN Unit 4E-Case Manager [phone]  Patient Details  Name: Eduardo Burch MRN: [id number] Date of Birth: 08-30-1940  Subjective/Objective:   Pt admitted with bilateral LE weakness- unable to ambulate- ?Laqueta Carina syndrome- plan for Plasmapheresis x5 treatments                    Action/Plan: PTA pt lived at home- per PT/OT evals- recommendations for CIR- MD please consider placing CIR consult to assess eligible for CIR- CM will follow.   Expected Discharge Date:                  Expected Discharge Plan:  IP Rehab Facility  In-House Referral:  Clinical Social Work  Discharge planning Services  CM Consult  Post Acute Care Choice:    Choice offered to:     DME Arranged:    DME Agency:     HH Arranged:    [place] Agency:     Status of Service:  In process, will continue to follow  If discussed at Long Length of Stay Meetings, dates discussed:    Discharge Disposition:   Additional Comments:  Dawayne Patricia, RN 11/15/2016, 9:50 AM

## 2016-11-15 NOTE — Consult Note (Signed)
Physical Medicine and Rehabilitation Consult Reason for Consult: Bilateral lower extremity weakness Referring Physician: [facility]   HPI: Eduardo Burch is a 76 y.o. right handed male with history of hypertension, prostate cancer with prostatectomy, Paget's disease. Presented 11/12/2016 with bilateral lower extremity weakness as well as decrease in functional mobility that progressed over the past 4 months. Patient reports having recent fall as well as reported 50 pound weight loss over the last few months. 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 Two-level home with bedroom Main floor. Patient had had an extensive workup over the past few months including MRI, PET scan and EMG studies  completed at [facility] as well at [facility].  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 Subcutaneous Lovenox for DVT prophylaxis. Physical occupational therapy evaluation completed 11/14/2016 with recommendations of physical medicine rehabilitation consult.   Review of Systems  Constitutional: Negative for chills and fever.  HENT: Negative for hearing loss.   Eyes: Negative for blurred vision and double vision.  Respiratory: Negative for cough and shortness of breath.   Cardiovascular: Negative for chest pain, palpitations and leg swelling.  Gastrointestinal: Positive for constipation. Negative for nausea and vomiting.  Genitourinary: Positive for urgency. Negative for dysuria, flank pain and hematuria.  Musculoskeletal: Positive for back pain, falls and myalgias.  Skin: Negative for rash.  Neurological: Positive for sensory change and weakness. Negative for seizures.  All other systems reviewed and are negative.  Past Medical History:  Diagnosis Date  . Constipated   . Elevated blood pressure reading   . Hx of cancer antigen 125  (CA-125) measurement    PROSTATE  . Hypercholesterolemia   . Hypertension   . Spondylosis    Past Surgical History:  Procedure Laterality Date  . PROATATECTOMY     Family History  Problem Relation Age of Onset  . Lung cancer Mother   . Spondylitis Father    Social History:  reports that he has quit smoking. His smoking use included Cigarettes. He has never used smokeless tobacco. He reports that he does not drink alcohol or use drugs. Allergies:  Allergies  Allergen Reactions  . Remicade [Infliximab] Anaphylaxis   Medications Prior to Admission  Medication Sig Dispense Refill  . acetaminophen (TYLENOL) 500 MG tablet Take 500-1,000 mg by mouth every 6 (six) hours as needed for headache.    Marland Kitchen aspirin 81 MG chewable tablet Chew 1 tablet (81 mg total) by mouth daily. 30 tablet 0  . docusate sodium (COLACE) 100 MG capsule Take 1 capsule (100 mg total) by mouth every 12 (twelve) hours. 60 capsule 0  . gabapentin (NEURONTIN) 300 MG capsule Take 300 mg by mouth at bedtime.    Marland Kitchen lisinopril (PRINIVIL,ZESTRIL) 10 MG tablet Take 1 tablet (10 mg total) by mouth daily. 90 tablet 3  . metoprolol succinate (TOPROL XL) 25 MG 24 hr tablet Take 1 tablet (25 mg total) by mouth daily. 90 tablet 3  . tiZANidine (ZANAFLEX) 2 MG tablet Take 2 mg by mouth 3 (three) times daily.  5  . traMADol (ULTRAM) 50 MG tablet Take 1 tablet (50 mg total) by mouth every 6 (six) hours as needed for moderate pain or severe pain. 30 tablet 0  . UNABLE TO FIND OUTPATIENT PHYSICAL THERAPY AND OCCUPATIONAL THERAPY  Dx: Guillain-Barre  Evaluation and Treat  1 each 0    Home: Home Living Family/patient expects to be discharged to:: Private residence Living Arrangements: Spouse/significant other Available Help at Discharge: Family, Available 24 hours/day Type of Home: House Home Access: Stairs to enter [employer] of Steps: 5 Entrance Stairs-Rails: Right, Left Home Layout: Two level, Able to live on main  level with bedroom/bathroom Alternate Level Stairs-Number of Steps: flight Bathroom Shower/Tub: Multimedia programmer: Handicapped height Bathroom Accessibility: Yes Home Equipment: Careers adviser History: Prior Function Level of Independence: Independent Comments: Patient works part-time at his company (desk job); used to be race Psychologist, clinical Status:  Mobility: Bed Mobility Overal bed mobility: Needs Assistance Bed Mobility: Supine to Sit Supine to sit: Mod assist, +2 for physical assistance, HOB elevated General bed mobility comments: modAx2 for LE management to floor and trunk to upright and steadying in seated  Transfers Overall transfer level: Needs assistance Equipment used: Rolling walker (2 wheeled), 2 person hand held assist Transfers: Sit to/from Stand, [employer] Transfers Sit to Stand: Mod assist, +2 physical assistance, From elevated surface Squat pivot transfers: Max assist, +2 physical assistance, From elevated surface General transfer comment: modAx2 for sit<> for power up and steadying in RW, vc for anterior pelvic tilt and chest forward, pt able to stand 15 sec before sitting mod Ax2 for lowering to bed, maxAx2 for squat pivot transfer to recliner, pt able make 2 small steps towards recliner but never came to fully upright.       ADL: ADL Overall ADL's : Needs assistance/impaired Eating/Feeding: Modified independent, Sitting Grooming: Wash/dry hands, Wash/dry face, Set up, Sitting Grooming Details (indicate cue type and reason): in recliner Upper Body Bathing: Minimal assistance, Sitting Lower Body Bathing: Moderate assistance, Sitting/lateral leans Upper Body Dressing : Set up, Sitting Lower Body Dressing: Moderate assistance, Sitting/lateral leans Lower Body Dressing Details (indicate cue type and reason): to don socks BLE Toilet Transfer: Maximal assistance, +2 for physical assistance, +2 for safety/equipment, Squat-pivot, BSC (2  HHA, no RW for safety) Toilet Transfer Details (indicate cue type and reason): simulated through recliner transfer Toileting- Clothing Manipulation and Hygiene: Moderate assistance, Sitting/lateral lean Functional mobility during ADLs: Maximal assistance, +2 for physical assistance, +2 for safety/equipment (stand pivot only) General ADL Comments: Pt fatigues very quickly, impacting ability to perform ADL  Cognition: Cognition Overall Cognitive Status: Within Functional Limits for tasks assessed Orientation Level: Oriented X4 Cognition Arousal/Alertness: Awake/alert Behavior During Therapy: WFL for tasks assessed/performed Overall Cognitive Status: Within Functional Limits for tasks assessed  Blood pressure (!) 171/77, pulse 89, temperature 98.6 F (37 C), temperature source Oral, resp. rate 20, height 6' (1.829 m), weight 66 kg (145 lb 9.6 oz), SpO2 100 %. Physical Exam  Constitutional: He is oriented to person, place, and time. He appears well-developed.  HENT:  Head: Normocephalic.  Eyes: EOM are normal.  Neck: Normal range of motion. Neck supple. No thyromegaly present.  Cardiovascular: Normal rate, regular rhythm and normal heart sounds.   Respiratory: Effort normal and breath sounds normal. No respiratory distress.  GI: Soft. Bowel sounds are normal. He exhibits no distension. There is no tenderness.  Neurological: He is alert and oriented to person, place, and time.  Pt alert. Resting>intentional tremor. cogwheeling of arms and legs with PROM. Flat/masked facies. RUE 4/5 prox to distal, LUE 4-/5. RLE: 3+ to 4-/5. LLE 3 to 3+/5 prox to distal. DTR's tr to 1+, senses pain and light touch in all 4  Skin: Skin is warm and dry.  Psychiatric: He has a normal mood and affect. His behavior is normal.    Results for orders placed or performed during the hospital encounter of 11/12/16 (from the past 24 hour(s))  CBC with Differential/Platelet     Status: Abnormal   Collection Time:  11/14/16  9:54 AM  Result Value Ref Range   WBC 12.2 (H) 4.0 - 10.5 K/uL   RBC 4.56 4.22 - 5.81 MIL/uL   Hemoglobin 14.7 13.0 - 17.0 g/dL   HCT 43.9 39.0 - 52.0 %   MCV 96.3 78.0 - 100.0 fL   MCH 32.2 26.0 - 34.0 pg   MCHC 33.5 30.0 - 36.0 g/dL   RDW 15.2 11.5 - 15.5 %   Platelets 423 (H) 150 - 400 K/uL   Neutrophils Relative % 79 %   Neutro Abs 9.6 (H) 1.7 - 7.7 K/uL   Lymphocytes Relative 14 %   Lymphs Abs 1.7 0.7 - 4.0 K/uL   Monocytes Relative 7 %   Monocytes Absolute 0.8 0.1 - 1.0 K/uL   Eosinophils Relative 0 %   Eosinophils Absolute 0.0 0.0 - 0.7 K/uL   Basophils Relative 0 %   Basophils Absolute 0.0 0.0 - 0.1 K/uL  Comprehensive metabolic panel     Status: Abnormal   Collection Time: 11/14/16 11:39 AM  Result Value Ref Range   Sodium 138 135 - 145 mmol/L   Potassium 4.0 3.5 - 5.1 mmol/L   Chloride 109 101 - 111 mmol/L   CO2 24 22 - 32 mmol/L   Glucose, Bld 82 65 - 99 mg/dL   BUN 11 6 - 20 mg/dL   Creatinine, Ser 0.61 0.61 - 1.24 mg/dL   Calcium 8.2 (L) 8.9 - 10.3 mg/dL   Total Protein 5.4 (L) 6.5 - 8.1 g/dL   Albumin 3.7 3.5 - 5.0 g/dL   AST 28 15 - 41 U/L   ALT 33 17 - 63 U/L   Alkaline Phosphatase 32 (L) 38 - 126 U/L   Total Bilirubin 0.9 0.3 - 1.2 mg/dL   GFR calc non Af Amer >60 >60 mL/min   GFR calc Af Amer >60 >60 mL/min   Anion gap 5 5 - 15  Magnesium     Status: None   Collection Time: 11/14/16 11:39 AM  Result Value Ref Range   Magnesium 2.0 1.7 - 2.4 mg/dL  Phosphorus     Status: Abnormal   Collection Time: 11/14/16 11:39 AM  Result Value Ref Range   Phosphorus 2.3 (L) 2.5 - 4.6 mg/dL   Dg Chest Port 1 View  Result Date: 11/13/2016 CLINICAL DATA:  R/O pneumothorax. Encounter for central line placement. Hx of HTN. Pt is a former smoker. EXAM: PORTABLE CHEST 1 VIEW COMPARISON:  11/12/2016 FINDINGS: Despite the history, it no central line is identified. There is no pneumothorax. By history not right IJ line was attempted but not placed. Patient has a  new right femoral line. The heart size and mediastinal contours are within normal limits. Both lungs are clear. The visualized skeletal structures are unremarkable. IMPRESSION: No active disease.  Central line not identified. Electronically Signed   By: Nolon Nations M.D.   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 Presentation today suspicious for Parkinson's Disease as well 1. Does the need for close, 24 hr/day medical supervision in concert with the patient's rehab needs make it unreasonable for this patient to be served in a less intensive setting? Yes 2. Co-Morbidities requiring supervision/potential  complications: dysphagia, paget's disease, htn, prostate cancer 3. Due to bladder management, bowel management, safety, skin/wound care, disease management, medication administration, pain management and patient education, does the patient require 24 hr/day rehab nursing? Yes 4. Does the patient require coordinated care of a physician, rehab nurse, PT (1-2 hrs/day, 5 days/week) and OT (1-2 hrs/day, 5 days/week) to address physical and functional deficits in the context of the above medical diagnosis(es)? Yes Addressing deficits in the following areas: balance, endurance, locomotion, strength, transferring, bowel/bladder control, bathing, dressing, feeding, grooming, toileting and psychosocial support 5. Can the patient actively participate in an intensive therapy program of at least 3 hrs of therapy per day at least 5 days per week? Yes 6. The potential for patient to make measurable gains while on inpatient rehab is good 7. Anticipated functional outcomes upon discharge from inpatient rehab are supervision  with PT, supervision with OT, n/a with SLP. 8. Estimated rehab length of stay to reach the above functional goals is: 14-18 days 9. Anticipated D/C setting: Home 10. Anticipated post D/C treatments: HH therapy and Outpatient therapy 11. Overall  Rehab/Functional Prognosis: excellent  RECOMMENDATIONS: This patient's condition is appropriate for continued rehabilitative care in the following setting: CIR Patient has agreed to participate in recommended program. Yes Note that insurance prior authorization may be required for reimbursement for recommended care.  Comment: Rehab Admissions Coordinator to follow up.  Thanks,  Meredith Staggers, MD, Mellody Drown    Cathlyn Parsons., PA-C 11/15/2016

## 2016-11-15 NOTE — Progress Notes (Signed)
PROGRESS NOTE    Eduardo Burch  ZOX:[id number] DOB: 11-17-1940 DOA: 11/12/2016 PCP: Lavone Orn, MD   Brief Narrative:  Eduardo Burch is a 76 y.o. malewith medical history significant of the Essential HTN, Prostate cancer s/p prostatectomy, ankylosing spondylitis, CIPD and Paget's disease. Patient presents with bilateral lower extremity weakness. Patient Unable to ambulate now. Patient has had extensive workup including MRI, PET scan, and EMG studies between here and [facility]. 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 Previously tried on IVIG. Other working differentials included paraneoplastic syndrome or chronic inflammatory polyradiculopathy. Neuorlogy consulted and evaluated and recommending Plasmapheresis (PLEX) for 5 days. PT evaluated and feel like patient is a candidate for CIR; Physiatrist evaluated and recommended CIR.   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  -Recently had IVIG -Neurology Consulted and recommending 5 day course of Plasmapheresis; Treatment #2 today -Labs sent including ESR (47), CRP (<0.8), vitamin B12 was 495, vitamin B1 was 139.3, copper was 108, folate was 26.0, SPEP with IFE showed slight increase in Alpha-2-Globulin, GM1 antibody, Lyme Negative, and paraneoplastic antibody panel -C/w Neurochecks q6h -Appreciate Neurology evaluation; Neuro Recommending Continuing to Monitor Pulmonary Function and O2 Sats and also recommending continuing Supplemental IV Hydration; Will continue IVF at 75 mL/hr for now  Dysphagia -SLP evaluated Recommending Soft Diet Thin Liquids  -MBS to be done;  Ordered and SLP Diet Recommendations of Regular Solids Thin Liquids -Recommending Medication administration as whole meds with puree  Essential Hypertension -C/w Metoprolol 25 mg po Daily and Lisinopril 10 mg po Daily -Added IV Hydralazine 10 mg q6hprn for SBP >180 or DBP >100 -C/w IVF with NS at 75 mL/hr per Neuro Recc's given increased risk of thrombosis   Hx of Prostate Cancer and Paget's Disease  -Follow up with PCP and Oncology as an outpatient;  -Recent PET Scan in June did not show Malignancy  -Paraneoplastic Antibody Panel Sent -Outpatient Neurologist would favor repeating Malignancy Evaluation  -Will discuss with Oncology in AM   Hypophosphatemia -Phos Level was 2.3 and improved to 2.5 -Replete with 20 mmol of KPhos IV yesterday -Continue to Monitor and Replete as Necessary -Repeat Phos Level in AM   Leukocytosis, improved -Mild. WBC went from 11.9 -> 9.8 -> 12.2 -> 9.9 -Suspect Reactive  -Continue to Monitor for S/Sx of Infection; If worsens will Pan-Culture -Repeat CBC in AM   Abnormal LFT's -Improved, AST is 28 and ALT is 34 -Continue to Trend  DVT prophylaxis: Enoxaparin 40 mg sq q24h Code Status: FULL CODE Family Communication: Discussed with Family at bedside  Disposition Plan: CIR  Consultants:   Neurology  CIR   Procedures:  PLEX   Antimicrobials:  Anti-infectives    None     Subjective: Seen and examined and stated he had a good day yesterday and good night. Patient states he slept well. No nausea or vomiting. No CP or SOB. States he feels like he can lift his feet and was unable to do that previously. No other concerns or complaints and getting PLEX today.   Objective: Vitals:   11/14/16 1632 11/14/16 2005 11/15/16 0008 11/15/16 0406  BP: Marland Kitchen)  150/81 (!) 151/77 (!) 163/98 (!) 171/77  Pulse: 90 89 (!) 103 89  Resp: (!) '24 18 20 20  ' Temp: 97.8 F (36.6 C) 98.4 F (36.9 C) 98.3 F (36.8 C) 98.6 F (37 C)  TempSrc: Oral Oral Oral Oral    SpO2: 96% 99% 100% 100%  Weight:    66 kg (145 lb 9.6 oz)  Height:        Intake/Output Summary (Last 24 hours) at 11/15/16 1236 Last data filed at 11/15/16 0300  Gross per 24 hour  Intake              840 ml  Output              400 ml  Net              440 ml   Filed Weights   11/12/16 1346 11/14/16 0329 11/15/16 0406  Weight: 71.2 kg (157 lb) 72.4 kg (159 lb 9.6 oz) 66 kg (145 lb 9.6 oz)   Examination: Physical Exam:  Constitutional: Pleasant Caucasian male in NAD appears calm Eyes: Sclerae anicteric; Lids normal  ENMT: External Ears and nose appear Normal Neck: Supple with no JVD Respiratory: Diminished but no wheezing/rales/rhonchi Cardiovascular: RRR; S1; S2. No Extremity Edema Abdomen: Soft, NT, ND. Bowel Sounds positive GU: Deferred Musculoskeletal: No clubbing; No contractures Skin: Warm and dry. No rashes or lesions on a limited skin eval Neurologic: CN 2-12 grossly intact. Lower Extremity has decreased strength. Slightly tremulous  Psychiatric: Normal mood and affect. Intact judgment and insight.   Data Reviewed: I have personally reviewed following labs and imaging studies  CBC:  Recent Labs Lab 11/12/16 2030 11/13/16 1031 11/13/16 1512 11/14/16 0954 11/15/16 1002  WBC 11.9* 9.8  --  12.2* 9.9  NEUTROABS 9.1*  --   --  9.6* 7.2  HGB 15.4 15.0 14.3 14.7 14.6  HCT 45.3 44.1 42.0 43.9 43.8  MCV 95.4 96.3  --  96.3 96.1  PLT 454* 443*  --  423* 142   Basic Metabolic Panel:  Recent Labs Lab 11/12/16 2030 11/13/16 1031 11/13/16 1512 11/14/16 1139 11/15/16 1002  NA 133* 137 139 138 138  K 4.8 5.1 4.1 4.0 3.8  CL 101 104 103 109 108  CO2 23 27  --  24 24  GLUCOSE 100* 108* 104* 82 108*  BUN 29* 27* 26* 11 11  CREATININE 0.96 0.79 0.60* 0.61 0.49*  CALCIUM 9.0 8.7*  --  8.2* 8.2*  MG  --   --   --  2.0 1.9  PHOS  --   --   --  2.3* 2.5   GFR: Estimated Creatinine Clearance: 71 mL/min (A) (by C-G formula based on SCr of 0.49 mg/dL  (L)). Liver Function Tests:  Recent Labs Lab 11/12/16 2030 11/13/16 1031 11/14/16 1139 11/15/16 1002  AST 60* 47* 28 28  ALT 82* 76* 33 34  ALKPHOS 59 59 32* 38  BILITOT 0.8 0.7 0.9 0.9  PROT 6.5 6.4* 5.4* 5.5*  ALBUMIN 3.0* 3.0* 3.7 3.5   No results for input(s): LIPASE, AMYLASE in the last 168 hours. No results for input(s): AMMONIA in the last 168 hours. Coagulation Profile:  Recent Labs Lab 11/13/16 1031  INR 0.97   Cardiac Enzymes: No results for input(s): CKTOTAL, CKMB, CKMBINDEX, TROPONINI in the last 168 hours. BNP (last 3 results) No results for input(s): PROBNP in the last 8760 hours. HbA1C: No results for input(s): HGBA1C in the last 72 hours. CBG:  No results for input(s): GLUCAP in the last 168 hours. Lipid Profile: No results for input(s): CHOL, HDL, LDLCALC, TRIG, CHOLHDL, LDLDIRECT in the last 72 hours. Thyroid Function Tests: No results for input(s): TSH, T4TOTAL, FREET4, T3FREE, THYROIDAB in the last 72 hours. Anemia Panel:  Recent Labs  11/12/16 2025 11/12/16 2030  VITAMINB12  --  495  FOLATE 26.0  --    Sepsis Labs: No results for input(s): PROCALCITON, LATICACIDVEN in the last 168 hours.  No results found for this or any previous visit (from the past 240 hour(s)).   Radiology Studies: Dg Chest Port 1 View  Result Date: 11/13/2016 CLINICAL DATA:  R/O pneumothorax. Encounter for central line placement. Hx of HTN. Pt is a former smoker. EXAM: PORTABLE CHEST 1 VIEW COMPARISON:  11/12/2016 FINDINGS: Despite the history, it no central line is identified. There is no pneumothorax. By history not right IJ line was attempted but not placed. Patient has a new right femoral line. The heart size and mediastinal contours are within normal limits. Both lungs are clear. The visualized skeletal structures are unremarkable. IMPRESSION: No active disease.  Central line not identified. Electronically Signed   By: Nolon Nations M.D.   On: 11/13/2016 14:51    Scheduled Meds: . aspirin  81 mg Oral Daily  . docusate sodium  100 mg Oral BID  . enoxaparin (LOVENOX) injection  40 mg Subcutaneous Q24H  . heparin  1,000 Units Intracatheter Once  . lisinopril  10 mg Oral Daily  . metoprolol succinate  25 mg Oral Daily   Continuous Infusions: . sodium chloride 75 mL/hr at 11/14/16 0216  . citrate dextrose    . potassium PHOSPHATE IVPB (mmol)      LOS: 3 days   Kerney Elbe, DO [facility] Pager [phone]  If 7PM-7AM, please contact night-coverage [email or web address] Password [facility] 11/15/2016, 12:36 PM

## 2016-11-15 NOTE — Progress Notes (Signed)
Subjective: The patient states that he felt a definite slight increase in his lower extremity strength yesterday (the day after his first PLEX, which was on Tuesday). PLEX #2 is today.   Objective: Current vital signs: BP (!) 171/77 (BP Location: Right Arm)   Pulse 89   Temp 98.6 F (37 C) (Oral)   Resp 20   Ht 6' (1.829 m)   Wt 66 kg (145 lb 9.6 oz)   SpO2 100%   BMI 19.75 kg/m  Vital signs in last 24 hours: Temp:  [97.8 F (36.6 C)-98.6 F (37 C)] 98.6 F (37 C) (09/13 0406) Pulse Rate:  [89-103] 89 (09/13 0406) Resp:  [18-25] 20 (09/13 0406) BP: (132-171)/(77-98) 171/77 (09/13 0406) SpO2:  [96 %-100 %] 100 % (09/13 0406) Weight:  [66 kg (145 lb 9.6 oz)] 66 kg (145 lb 9.6 oz) (09/13 0406)  Intake/Output from previous day: 09/12 0701 - 09/13 0700 In: 960 [P.O.:360; I.V.:600] Out: 625 [Urine:625] Intake/Output this shift: No intake/output data recorded. Nutritional status: DIET SOFT Room service appropriate? Yes; Fluid consistency: Thin  Neurologic Exam: Mental Status: Intact to complex questions and commands.  Cranial Nerves: II:  Tracks normally.  III,IV, VI: No ptosis. EOMI without nystagmus.  VII: Smile symmetric VIII: Hearing intact to conversation IX,X: No pharyngeal dysarthria XI: Head rotates normally to left and right XII: No lingual dysarthria Motor: Bilateral upper extremities: 4/5 proximal and distal RLE: 2-3/5 hip flexion, 3/5 hip extension, 3/5 knee extension, 4/5 knee flexion, 4+/5 ADF/APF LLE: 2/5 hip flexion, 2-3/5 hip extension, 3/5 knee extension, 4-/5 knee flexion, 4/5 ADF/APF Decreased muscle bulk and tone x 4 proximal and distal Sensory: Temp and light touch intact in all 4 extremities proximally and distally. No dysesthesia or allodynia.  Deep Tendon Reflexes:  0 upper and lower extremity reflexes bilaterally Cerebellar: No ataxia with FNF bilaterally. Prominent action tremor is present bilaterally.  Gait: Unable to ambulate due to BLE  weakness  Lab Results: Results for orders placed or performed during the hospital encounter of 11/12/16 (from the past 48 hour(s))  CBC     Status: Abnormal   Collection Time: 11/13/16 10:31 AM  Result Value Ref Range   WBC 9.8 4.0 - 10.5 K/uL   RBC 4.58 4.22 - 5.81 MIL/uL   Hemoglobin 15.0 13.0 - 17.0 g/dL   HCT 44.1 39.0 - 52.0 %   MCV 96.3 78.0 - 100.0 fL   MCH 32.8 26.0 - 34.0 pg   MCHC 34.0 30.0 - 36.0 g/dL   RDW 15.3 11.5 - 15.5 %   Platelets 443 (H) 150 - 400 K/uL  Protime-INR     Status: None   Collection Time: 11/13/16 10:31 AM  Result Value Ref Range   Prothrombin Time 12.8 11.4 - 15.2 seconds   INR 0.97   APTT     Status: None   Collection Time: 11/13/16 10:31 AM  Result Value Ref Range   aPTT 26 24 - 36 seconds  Comprehensive metabolic panel     Status: Abnormal   Collection Time: 11/13/16 10:31 AM  Result Value Ref Range   Sodium 137 135 - 145 mmol/L   Potassium 5.1 3.5 - 5.1 mmol/L   Chloride 104 101 - 111 mmol/L   CO2 27 22 - 32 mmol/L   Glucose, Bld 108 (H) 65 - 99 mg/dL   BUN 27 (H) 6 - 20 mg/dL   Creatinine, Ser 0.79 0.61 - 1.24 mg/dL   Calcium 8.7 (L) 8.9 - 10.3 mg/dL  Total Protein 6.4 (L) 6.5 - 8.1 g/dL   Albumin 3.0 (L) 3.5 - 5.0 g/dL   AST 47 (H) 15 - 41 U/L   ALT 76 (H) 17 - 63 U/L   Alkaline Phosphatase 59 38 - 126 U/L   Total Bilirubin 0.7 0.3 - 1.2 mg/dL   GFR calc non Af Amer >60 >60 mL/min   GFR calc Af Amer >60 >60 mL/min    Comment: (NOTE) The eGFR has been calculated using the CKD EPI equation. This calculation has not been validated in all clinical situations. eGFR's persistently <60 mL/min signify possible Chronic Kidney Disease.    Anion gap 6 5 - 15  I-stat chem 8, ed     Status: Abnormal   Collection Time: 11/13/16  3:12 PM  Result Value Ref Range   Sodium 139 135 - 145 mmol/L   Potassium 4.1 3.5 - 5.1 mmol/L   Chloride 103 101 - 111 mmol/L   BUN 26 (H) 6 - 20 mg/dL   Creatinine, Ser 0.60 (L) 0.61 - 1.24 mg/dL   Glucose,  Bld 104 (H) 65 - 99 mg/dL   Calcium, Ion 1.13 (L) 1.15 - 1.40 mmol/L   TCO2 22 22 - 32 mmol/L   Hemoglobin 14.3 13.0 - 17.0 g/dL   HCT 42.0 39.0 - 52.0 %  CBC with Differential/Platelet     Status: Abnormal   Collection Time: 11/14/16  9:54 AM  Result Value Ref Range   WBC 12.2 (H) 4.0 - 10.5 K/uL   RBC 4.56 4.22 - 5.81 MIL/uL   Hemoglobin 14.7 13.0 - 17.0 g/dL   HCT 43.9 39.0 - 52.0 %   MCV 96.3 78.0 - 100.0 fL   MCH 32.2 26.0 - 34.0 pg   MCHC 33.5 30.0 - 36.0 g/dL   RDW 15.2 11.5 - 15.5 %   Platelets 423 (H) 150 - 400 K/uL   Neutrophils Relative % 79 %   Neutro Abs 9.6 (H) 1.7 - 7.7 K/uL   Lymphocytes Relative 14 %   Lymphs Abs 1.7 0.7 - 4.0 K/uL   Monocytes Relative 7 %   Monocytes Absolute 0.8 0.1 - 1.0 K/uL   Eosinophils Relative 0 %   Eosinophils Absolute 0.0 0.0 - 0.7 K/uL   Basophils Relative 0 %   Basophils Absolute 0.0 0.0 - 0.1 K/uL  Comprehensive metabolic panel     Status: Abnormal   Collection Time: 11/14/16 11:39 AM  Result Value Ref Range   Sodium 138 135 - 145 mmol/L   Potassium 4.0 3.5 - 5.1 mmol/L   Chloride 109 101 - 111 mmol/L   CO2 24 22 - 32 mmol/L   Glucose, Bld 82 65 - 99 mg/dL   BUN 11 6 - 20 mg/dL   Creatinine, Ser 0.61 0.61 - 1.24 mg/dL   Calcium 8.2 (L) 8.9 - 10.3 mg/dL   Total Protein 5.4 (L) 6.5 - 8.1 g/dL   Albumin 3.7 3.5 - 5.0 g/dL   AST 28 15 - 41 U/L   ALT 33 17 - 63 U/L   Alkaline Phosphatase 32 (L) 38 - 126 U/L   Total Bilirubin 0.9 0.3 - 1.2 mg/dL   GFR calc non Af Amer >60 >60 mL/min   GFR calc Af Amer >60 >60 mL/min    Comment: (NOTE) The eGFR has been calculated using the CKD EPI equation. This calculation has not been validated in all clinical situations. eGFR's persistently <60 mL/min signify possible Chronic Kidney Disease.    Anion  gap 5 5 - 15  Magnesium     Status: None   Collection Time: 11/14/16 11:39 AM  Result Value Ref Range   Magnesium 2.0 1.7 - 2.4 mg/dL  Phosphorus     Status: Abnormal   Collection Time:  11/14/16 11:39 AM  Result Value Ref Range   Phosphorus 2.3 (L) 2.5 - 4.6 mg/dL    No results found for this or any previous visit (from the past 240 hour(s)).  Lipid Panel No results for input(s): CHOL, TRIG, HDL, CHOLHDL, VLDL, LDLCALC in the last 72 hours.  Studies/Results: Dg Chest Port 1 View  Result Date: 11/13/2016 CLINICAL DATA:  R/O pneumothorax. Encounter for central line placement. Hx of HTN. Pt is a former smoker. EXAM: PORTABLE CHEST 1 VIEW COMPARISON:  11/12/2016 FINDINGS: Despite the history, it no central line is identified. There is no pneumothorax. By history not right IJ line was attempted but not placed. Patient has a new right femoral line. The heart size and mediastinal contours are within normal limits. Both lungs are clear. The visualized skeletal structures are unremarkable. IMPRESSION: No active disease.  Central line not identified. Electronically Signed   By: Nolon Nations M.D.   On: 11/13/2016 14:51    Medications:  Scheduled: . aspirin  81 mg Oral Daily  . docusate sodium  100 mg Oral BID  . enoxaparin (LOVENOX) injection  40 mg Subcutaneous Q24H  . heparin  1,000 Units Intracatheter Once  . lisinopril  10 mg Oral Daily  . metoprolol succinate  25 mg Oral Daily   Continuous: . sodium chloride 75 mL/hr at 11/14/16 0216  . citrate dextrose    . potassium PHOSPHATE IVPB (mmol)      Assessment: 76 y.o. 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 On day #2 of PLEX. States that there has been definite initial improvement in his lower extremity strength since PLEX #1 on Tuesday. 1. Previously treated with a 5 day course of IVIG during recent admission to [facility] from 7/27 through 10/04/2015. Patient states that his subjective transient strength improvement from the IVIG was so minimal that it may have been a placebo effect.   2. CSF analysis last admission had shown albuminocytologic dissociation (CSF protein 215  with normal cell count and glucose).  3. Exam at last admission showed 4/5 bilateral lower extremity strength, diminished sensation in the legs, areflexia, and coarse action tremor in bilateral upper extremities.  4. Similar clinical presentation and exam findings this admission with 3/5 proximal and 4/5 distal lower extremity strength. However, his strength is now worse than at prior admission. 5. Labs resulted this admission: Copper, CRP, B1 and B12 levels normal. Protein electrophoresis unremarkable. Alpha-1 globulin normal. Alpha-2 globulin borderline elevated. Lyme titer negative.   Recommendations: 1. Continue 5 day course of plasmapheresis. He has completed 1 treatment. Next treatment is today. 2. Lab sendouts are pending. 3. Neurochecks q6h.  4. Monitor pulmonary function and O2 sats 5. Supplemental IV hydration is recommended given elevated BUN:Cr ratio. There is an increased risk of thrombosis during treatment with IVIG due to hyperviscosity, therefore hydration should be well maintained.  6. DVT prophylaxis.  7. PT/OT   LOS: 3 days   '@Electronically'  signed: Dr. Kerney Elbe 11/15/2016  7:26 AM

## 2016-11-15 NOTE — Progress Notes (Addendum)
Physical Therapy Treatment Patient Details Name: Eduardo Burch MRN: [id number] DOB: 08/16/1940 Today's Date: 11/15/2016    History of Present Illness Eduardo Burch is a 76 y.o. malewith medical history significant of the thecal HTN, prostate cancer s/p prostatectomy, ankylosing spondylitis, CIPD and Paget's disease(associated symptoms include difficulty swallowing, fevers, chills, night sweats, 50 pounds weight loss over last 4 months). Patient presents with bilateral lower extremity weakness resulting in a fall. 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      PT Comments    Pt making progress towards goals. Used stedy this session to practice sit<>stand, as well as, weightshifting side to side for pre gait activities. Required mod to max A +2 to stand using stedy. Pt very motivated to get better and asked about time frame. Educated that it depends on how his body is responding. Continue to recommend CIR at d/c to increase independence and safety with functional mobility. Will continue to follow acutely.    Follow Up Recommendations  CIR     Equipment Recommendations  None recommended by PT    Recommendations for Other Services Rehab consult     Precautions / Restrictions Precautions Precautions: Fall Restrictions Weight Bearing Restrictions: No    Mobility  Bed Mobility Overal bed mobility: Needs Assistance Bed Mobility: Supine to Sit     Supine to sit: Mod assist;+2 for physical assistance;HOB elevated     General bed mobility comments: Mod A +2 for trunk elevation and scooting hips to EOB. Required steadying assist once seated.   Transfers Overall transfer level: Needs assistance   Transfers: Sit to/from Stand Sit to Stand: Mod assist;Max assist;+2 physical assistance         General transfer comment: Mod A +2 for sit<>stand from elevated surface; max A +2 from lower surface. Verbal and manual  cues for upright posture. Very anxious sitting in the stedy and required cues to allow shins to rest on shin guard. Pt with increased comfort once he allowed shins to rest on shin rest.   Ambulation/Gait             General Gait Details: Unable    Stairs            Wheelchair Mobility    Modified Rankin (Stroke Patients Only)       Balance Overall balance assessment: Needs assistance Sitting-balance support: Feet supported;Bilateral upper extremity supported Sitting balance-Leahy Scale: Poor Sitting balance - Comments: Very reliant on UE support and external assist initially to maintain sitting balance.    Standing balance support: Bilateral upper extremity supported Standing balance-Leahy Scale: Poor Standing balance comment: Reliant on UE support once standing.                             Cognition Arousal/Alertness: Awake/alert Behavior During Therapy: Anxious Overall Cognitive Status: Within Functional Limits for tasks assessed                                 General Comments: Pt very anxious about using stedy, as well as, general prognosis. Asked several times about how long it will take him to get back to normal.       Exercises Other Exercises Other Exercises: Practiced weightshifting in standing for ~30 seconds to increase weight acceptance on BLE. Verbal cues for upright posture. Use of UE support on stedy to maintain  balance.     General Comments General comments (skin integrity, edema, etc.): Pt's wife present during session. Educated about benefits of sitting up in chair and instructed to sit up for 1 hour.       Pertinent Vitals/Pain Pain Assessment: No/denies pain    Home Living                      Prior Function            PT Goals (current goals can now be found in the care plan section) Acute Rehab PT Goals Patient Stated Goal: get back to normal PT Goal Formulation: With patient Time For Goal  Achievement: 11/28/16 Potential to Achieve Goals: Fair Progress towards PT goals: Progressing toward goals    Frequency    Min 3X/week      PT Plan Current plan remains appropriate    Co-evaluation              AM-PAC PT "6 Clicks" Daily Activity  Outcome Measure  Difficulty turning over in bed (including adjusting bedclothes, sheets and blankets)?: Unable Difficulty moving from lying on back to sitting on the side of the bed? : Unable Difficulty sitting down on and standing up from a chair with arms (e.g., wheelchair, bedside commode, etc,.)?: Unable Help needed moving to and from a bed to chair (including a wheelchair)?: Total Help needed walking in hospital room?: Total Help needed climbing 3-5 steps with a railing? : Total 6 Click Score: 6    End of Session Equipment Utilized During Treatment: Gait belt Activity Tolerance: Patient tolerated treatment well Patient left: in chair;with call bell/phone within reach;with chair alarm set;with family/visitor present Nurse Communication: Mobility status;Other (comment) (transfer with stedy ) PT Visit Diagnosis: Unsteadiness on feet (R26.81);Other abnormalities of gait and mobility (R26.89);Muscle weakness (generalized) (M62.81);Difficulty in walking, not elsewhere classified (R26.2);Other symptoms and signs involving the nervous system (R29.898)     Time: 3557-3220 PT Time Calculation (min) (ACUTE ONLY): 27 min  Charges:  $Therapeutic Activity: 8-22 mins $Neuromuscular Re-education: 8-22 mins                    G Codes:       Eduardo Burch, PT, DPT  Acute Rehabilitation Services  Pager: [phone]    Eduardo Burch 11/15/2016, 4:28 PM

## 2016-11-15 NOTE — Progress Notes (Signed)
I will meet with pt and family tomorrow to begin discussions concerning a possible inpt rehab admit once medical workup is complete and pending insurance approval. [phone]

## 2016-11-15 NOTE — Progress Notes (Signed)
Pt refusing AM lab requesting that it be drawn at 0600.

## 2016-11-16 LAB — COMPREHENSIVE METABOLIC PANEL
ALBUMIN: 3.9 g/dL (ref 3.5–5.0)
ALT: 17 U/L (ref 17–63)
ANION GAP: 6 (ref 5–15)
AST: 18 U/L (ref 15–41)
Alkaline Phosphatase: 22 U/L — ABNORMAL LOW (ref 38–126)
BILIRUBIN TOTAL: 0.9 mg/dL (ref 0.3–1.2)
BUN: 11 mg/dL (ref 6–20)
CO2: 20 mmol/L — AB (ref 22–32)
Calcium: 8.3 mg/dL — ABNORMAL LOW (ref 8.9–10.3)
Chloride: 112 mmol/L — ABNORMAL HIGH (ref 101–111)
Creatinine, Ser: 0.48 mg/dL — ABNORMAL LOW (ref 0.61–1.24)
GFR calc non Af Amer: >60 mL/min (ref >60)
GLUCOSE: 104 mg/dL — AB (ref 65–99)
Potassium: 3.6 mmol/L (ref 3.5–5.1)
SODIUM: 138 mmol/L (ref 135–145)
TOTAL PROTEIN: 4.9 g/dL — AB (ref 6.5–8.1)

## 2016-11-16 LAB — CBC WITH DIFFERENTIAL/PLATELET
BASOS PCT: 0 %
Basophils Absolute: 0 10*3/uL (ref 0.0–0.1)
EOS ABS: 0.2 10*3/uL (ref 0.0–0.7)
Eosinophils Relative: 1 %
HCT: 42.9 % (ref 39.0–52.0)
Hemoglobin: 14.4 g/dL (ref 13.0–17.0)
Lymphocytes Relative: 20 %
Lymphs Abs: 2.1 10*3/uL (ref 0.7–4.0)
MCH: 32 pg (ref 26.0–34.0)
MCHC: 33.6 g/dL (ref 30.0–36.0)
MCV: 95.3 fL (ref 78.0–100.0)
MONO ABS: 1.3 10*3/uL — AB (ref 0.1–1.0)
MONOS PCT: 13 %
Neutro Abs: 7 10*3/uL (ref 1.7–7.7)
Neutrophils Relative %: 66 %
Platelets: 394 10*3/uL (ref 150–400)
RBC: 4.5 MIL/uL (ref 4.22–5.81)
RDW: 15.2 % (ref 11.5–15.5)
WBC: 10.6 10*3/uL — ABNORMAL HIGH (ref 4.0–10.5)

## 2016-11-16 LAB — MISC LABCORP TEST (SEND OUT)
LABCORP TEST CODE: 9985
LABCORP TEST CODE: 9985

## 2016-11-16 LAB — PHOSPHORUS: PHOSPHORUS: 3.3 mg/dL (ref 2.5–4.6)

## 2016-11-16 LAB — MAGNESIUM: Magnesium: 1.8 mg/dL (ref 1.7–2.4)

## 2016-11-16 NOTE — Progress Notes (Signed)
PROGRESS NOTE    Eduardo Burch  WYS:[id number] DOB: 03/12/1940 DOA: 11/12/2016 PCP: Lavone Orn, MD   Brief Narrative:  Eduardo Burch is a 76 y.o. malewith medical history significant of the Essential HTN, Prostate cancer s/p prostatectomy, ankylosing spondylitis, CIPD and Paget's disease. Patient presents with bilateral lower extremity weakness. Patient Unable to ambulate now. Patient has had extensive workup including MRI, PET scan, and EMG studies between here and [facility]. 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 Previously tried on IVIG. Other working differentials included paraneoplastic syndrome or chronic inflammatory polyradiculopathy. Neuorlogy consulted and evaluated and recommending Plasmapheresis (PLEX) for 5 days. PT evaluated and feel like patient is a candidate for CIR; Physiatrist evaluated and recommended CIR. Rehab Coordinator to meet with Patient and Family today.   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  -Recently had IVIG during last Hospitalization  -Neurology Consulted and recommending 5 day course of Plasmapheresis; Treatment #2 yesterday  -Labs sent including ESR (47), CRP (<0.8), vitamin B12 was 495, vitamin B1 was 139.3, copper was 108, folate was 26.0, SPEP with IFE showed slight increase in Alpha-2-Globulin, GM1 antibody, Lyme Negative, and paraneoplastic antibody panel pending -C/w Neurochecks q6h -Appreciate Neurology evaluation; Neuro Recommending Continuing to Monitor Pulmonary Function and O2  -Supplemental Hydration with NS at 75 mL/hr D/C'd for now  Dysphagia -SLP  evaluated Recommending Soft Diet Thin Liquids  -MBS done and SLP Diet Recommendations of Regular Solids Thin Liquids -Recommending Medication administration as whole meds with puree  Essential Hypertension -C/w Metoprolol 25 mg po Daily and Lisinopril 10 mg po Daily -Added IV Hydralazine 10 mg q6hprn for SBP >180 or DBP >100 -D/C'd IVF with NS at 75 mL/hr as patient continues to have elevated BP and was urinating a lot last night   Hx of Prostate Cancer and Paget's Disease  -Follow up with PCP and Oncology as an outpatient;  -Recent PET Scan in June did not show Malignancy  -Paraneoplastic Antibody Panel Sent -Outpatient Neurologist would favor repeating Malignancy Evaluation  -Will discuss with Oncology in AM   Hypophosphatemia -Phos Level was 2.3 and improved to 3.3 -Replete with 20 mmol of KPhos IV yesterday -Continue to Monitor and Replete as Necessary -Repeat Phos Level in AM   Leukocytosis, improved -Mild. WBC went from 9.9 -> 10.6 -Suspect Reactive  -Continue to Monitor for S/Sx of Infection; If worsens will Pan-Culture -Repeat CBC in AM   Abnormal LFT's -Improved, AST is now 18 and ALT is now 17 -Continue to Trend  DVT prophylaxis: Enoxaparin 40 mg sq q24h Code Status: FULL CODE Family Communication: Discussed with Family at bedside  Disposition Plan: CIR  Consultants:   Neurology  CIR   Procedures:  PLEX   Antimicrobials:  Anti-infectives    None     Subjective: Seen and examined and stated he had a rough night last night and did not sleep well and was urinating all night. No CP or SOB but felt groggy this AM. No other complaints or concerns and wanted to sleep.   Objective: Vitals:   11/15/16 1756 11/15/16 2005 11/16/16 0440 11/16/16 0851  BP: Marland Kitchen)  144/75 (!) 163/89 (!) 177/97 (!) 156/95  Pulse: 100 97 (!) 101 98  Resp: (!) 28 (!) 22 20 (!) 24  Temp: 97.6 F (36.4 C) 98.6 F (37 C) 98.6 F (37 C) 98.3 F (36.8 C)  TempSrc: Oral Oral Oral  Oral  SpO2: 100% 98% 97% 99%  Weight:   68.8 kg (151 lb 9.6 oz)   Height:        Intake/Output Summary (Last 24 hours) at 11/16/16 1229 Last data filed at 11/16/16 0913  Gross per 24 hour  Intake             1265 ml  Output              800 ml  Net              465 ml   Filed Weights   11/14/16 0329 11/15/16 0406 11/16/16 0440  Weight: 72.4 kg (159 lb 9.6 oz) 66 kg (145 lb 9.6 oz) 68.8 kg (151 lb 9.6 oz)   Examination: Physical Exam:  Constitutional: Pleasant Caucasian Male in NAD Eyes: Sclerae anicteric; Conjunctivae Non-injected ENMT: Grossly normal hearing. Mucous membranes appear moist Neck: Supple with No JVD Respiratory: CTAB; No wheezing/rales/rhonchi. Patient  Cardiovascular: RRR; S1 S2; No appreciable LE edema Abdomen: Soft, NT, ND. Bowel sounds present GU: Deferred Musculoskeletal: No clubbing; No contractures Skin: Warm and dry. No rashes or lesions on a limited skin eval Neurologic: CN 2-12 grossly intact. Still has LE weakness and decreased strength. Slightly tremulous Psychiatric: Normal Mood and affect. Intact judgement and insight. Awake and Alert.  Data Reviewed: I have personally reviewed following labs and imaging studies  CBC:  Recent Labs Lab 11/12/16 2030 11/13/16 1031 11/13/16 1512 11/14/16 0954 11/15/16 1002 11/16/16 0250  WBC 11.9* 9.8  --  12.2* 9.9 10.6*  NEUTROABS 9.1*  --   --  9.6* 7.2 7.0  HGB 15.4 15.0 14.3 14.7 14.6 14.4  HCT 45.3 44.1 42.0 43.9 43.8 42.9  MCV 95.4 96.3  --  96.3 96.1 95.3  PLT 454* 443*  --  423* 386 833   Basic Metabolic Panel:  Recent Labs Lab 11/12/16 2030 11/13/16 1031 11/13/16 1512 11/14/16 1139 11/15/16 1002 11/16/16 0250  NA 133* 137 139 138 138 138  K 4.8 5.1 4.1 4.0 3.8 3.6  CL 101 104 103 109 108 112*  CO2 23 27  --  24 24 20*  GLUCOSE 100* 108* 104* 82 108* 104*  BUN 29* 27* 26* _0 CREATININE 0.96 0.79 0.60* 0.61 0.49* 0.48*  CALCIUM 9.0 8.7*  --  8.2* 8.2* 8.3*  MG  --   --   --   2.0 1.9 1.8  PHOS  --   --   --  2.3* 2.5 3.3   GFR: Estimated Creatinine Clearance: 74.1 mL/min (A) (by C-G formula based on SCr of 0.48 mg/dL (L)). Liver Function Tests:  Recent Labs Lab 11/12/16 2030 11/13/16 1031 11/14/16 1139 11/15/16 1002 11/16/16 0250  AST 60* 47* _1 ALT 82* 76* 33 34 17  ALKPHOS 59 59 32* 38 22*  BILITOT 0.8 0.7 0.9 0.9 0.9  PROT 6.5 6.4* 5.4* 5.5* 4.9*  ALBUMIN 3.0* 3.0* 3.7 3.5 3.9   No results for input(s): LIPASE, AMYLASE in the last 168 hours. No results for input(s): AMMONIA in the last 168 hours. Coagulation Profile:  Recent Labs Lab 11/13/16 1031  INR 0.97   Cardiac Enzymes: No results for input(s): CKTOTAL, CKMB,  CKMBINDEX, TROPONINI in the last 168 hours. BNP (last 3 results) No results for input(s): PROBNP in the last 8760 hours. HbA1C: No results for input(s): HGBA1C in the last 72 hours. CBG: No results for input(s): GLUCAP in the last 168 hours. Lipid Profile: No results for input(s): CHOL, HDL, LDLCALC, TRIG, CHOLHDL, LDLDIRECT in the last 72 hours. Thyroid Function Tests: No results for input(s): TSH, T4TOTAL, FREET4, T3FREE, THYROIDAB in the last 72 hours. Anemia Panel: No results for input(s): VITAMINB12, FOLATE, FERRITIN, TIBC, IRON, RETICCTPCT in the last 72 hours. Sepsis Labs: No results for input(s): PROCALCITON, LATICACIDVEN in the last 168 hours.  No results found for this or any previous visit (from the past 240 hour(s)).   Radiology Studies: No results found. Scheduled Meds: . aspirin  81 mg Oral Daily  . docusate sodium  100 mg Oral BID  . enoxaparin (LOVENOX) injection  40 mg Subcutaneous Q24H  . heparin  1,000 Units Intracatheter Once  . heparin  1,000 Units Intracatheter Once  . lisinopril  10 mg Oral Daily  . metoprolol succinate  25 mg Oral Daily   Continuous Infusions: . citrate dextrose    . citrate dextrose 500 mL (11/15/16 1410)  . potassium PHOSPHATE IVPB (mmol)      LOS: 4 days    Kerney Elbe, DO [facility] Pager [phone]  If 7PM-7AM, please contact night-coverage [email or web address] Password [facility] 11/16/2016, 12:29 PM

## 2016-11-16 NOTE — Progress Notes (Signed)
Physical Therapy Treatment Patient Details Name: Eduardo Burch MRN: [id number] DOB: 11-Apr-1940 Today's Date: 11/16/2016    History of Present Illness Eduardo Burch is a 76 y.o. malewith medical history significant of the thecal HTN, prostate cancer s/p prostatectomy, ankylosing spondylitis, CIPD and Paget's disease(associated symptoms include difficulty swallowing, fevers, chills, night sweats, 50 pounds weight loss over last 4 months). Patient presents with bilateral lower extremity weakness resulting in a fall. 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      PT Comments    Patient tolerated sit to stand transfers X4 and stand pivot. Pt required mod A/mod A +2 for all mobility. Pt was able to complete pre gait activities prior to stand pivot transfer with assist and RW. Pt with HR up to 134 with stand pivot transfer. All other vitals WNL. Continue to progress as tolerated.     Follow Up Recommendations  CIR     Equipment Recommendations  None recommended by PT    Recommendations for Other Services Rehab consult     Precautions / Restrictions Precautions Precautions: Fall    Mobility  Bed Mobility Overal bed mobility: Needs Assistance Bed Mobility: Supine to Sit     Supine to sit: Mod assist;HOB elevated     General bed mobility comments: assist to elevate trunk into sitting; cues for technique  Transfers Overall transfer level: Needs assistance Equipment used: Rolling walker (2 wheeled) Transfers: Sit to/from [facility] Sit to Stand: Mod assist;+2 physical assistance Stand pivot transfers: Mod assist;+2 safety/equipment       General transfer comment: pt performed sit to stand transfers X4; +2 assist required initial trial from EOB and mod +1 required next 3 trials from recliner; +2 assist for eccentric loading when returning to sit; cues for safe hand placement, positioning, and  technique with improved power up each trial  Ambulation/Gait             General Gait Details: pt was able to complete pregait acitivies prior to stand pivot with min/mod A and RW for balance   Stairs            Wheelchair Mobility    Modified Rankin (Stroke Patients Only)       Balance Overall balance assessment: Needs assistance Sitting-balance support: Feet supported;Bilateral upper extremity supported Sitting balance-Leahy Scale: Poor     Standing balance support: Bilateral upper extremity supported Standing balance-Leahy Scale: Poor Standing balance comment: pt required assist for balance from therapist as well as bilat UE support; tremors noted in standing                            Cognition Arousal/Alertness: Awake/alert Behavior During Therapy: Anxious Overall Cognitive Status: Within Functional Limits for tasks assessed                                 General Comments: anxious about mobility but improving toward end of session      Exercises Other Exercises Other Exercises: bilat UE/LE coordination exercises     General Comments General comments (skin integrity, edema, etc.): HR up to 134 with stand pivot transfer      Pertinent Vitals/Pain Pain Assessment: No/denies pain    Home Living                      Prior Function  PT Goals (current goals can now be found in the care plan section) Acute Rehab PT Goals Patient Stated Goal: get back to normal Progress towards PT goals: Progressing toward goals    Frequency    Min 3X/week      PT Plan Current plan remains appropriate    Co-evaluation              AM-PAC PT "6 Clicks" Daily Activity  Outcome Measure  Difficulty turning over in bed (including adjusting bedclothes, sheets and blankets)?: A Lot Difficulty moving from lying on back to sitting on the side of the bed? : Unable Difficulty sitting down on and standing up from a  chair with arms (e.g., wheelchair, bedside commode, etc,.)?: Unable Help needed moving to and from a bed to chair (including a wheelchair)?: A Lot Help needed walking in hospital room?: A Lot Help needed climbing 3-5 steps with a railing? : Total 6 Click Score: 9    End of Session Equipment Utilized During Treatment: Gait belt Activity Tolerance: Patient tolerated treatment well Patient left: in chair;with call bell/phone within reach;with chair alarm set Nurse Communication: Mobility status PT Visit Diagnosis: Unsteadiness on feet (R26.81);Other abnormalities of gait and mobility (R26.89);Muscle weakness (generalized) (M62.81);Difficulty in walking, not elsewhere classified (R26.2);Other symptoms and signs involving the nervous system (R29.898)     Time: 1328-1400 PT Time Calculation (min) (ACUTE ONLY): 32 min  Charges:  $Therapeutic Activity: 23-37 mins                    G Codes:       Earney Navy, PTA Pager: [phone]     Darliss Cheney 11/16/2016, 3:10 PM

## 2016-11-17 DIAGNOSIS — K5900 Constipation, unspecified: Secondary | ICD-10-CM

## 2016-11-17 LAB — COMPREHENSIVE METABOLIC PANEL
ALT: 15 U/L — ABNORMAL LOW (ref 17–63)
AST: 29 U/L (ref 15–41)
Albumin: 3.4 g/dL — ABNORMAL LOW (ref 3.5–5.0)
Alkaline Phosphatase: 30 U/L — ABNORMAL LOW (ref 38–126)
Anion gap: 3 — ABNORMAL LOW (ref 5–15)
BUN: 18 mg/dL (ref 6–20)
CO2: 28 mmol/L (ref 22–32)
Calcium: 8.5 mg/dL — ABNORMAL LOW (ref 8.9–10.3)
Chloride: 110 mmol/L (ref 101–111)
Creatinine, Ser: 0.6 mg/dL — ABNORMAL LOW (ref 0.61–1.24)
Glucose, Bld: 102 mg/dL — ABNORMAL HIGH (ref 65–99)
POTASSIUM: 4.5 mmol/L (ref 3.5–5.1)
Sodium: 141 mmol/L (ref 135–145)
Total Bilirubin: 1.4 mg/dL — ABNORMAL HIGH (ref 0.3–1.2)
Total Protein: 4.7 g/dL — ABNORMAL LOW (ref 6.5–8.1)

## 2016-11-17 LAB — CBC WITH DIFFERENTIAL/PLATELET
BASOS ABS: 0 10*3/uL (ref 0.0–0.1)
Basophils Relative: 0 %
EOS PCT: 2 %
Eosinophils Absolute: 0.2 10*3/uL (ref 0.0–0.7)
HCT: 41.7 % (ref 39.0–52.0)
Hemoglobin: 14.1 g/dL (ref 13.0–17.0)
LYMPHS ABS: 2.4 10*3/uL (ref 0.7–4.0)
LYMPHS PCT: 22 %
MCH: 32.4 pg (ref 26.0–34.0)
MCHC: 33.8 g/dL (ref 30.0–36.0)
MCV: 95.9 fL (ref 78.0–100.0)
MONO ABS: 1.1 10*3/uL — AB (ref 0.1–1.0)
Monocytes Relative: 10 %
Neutro Abs: 7.2 10*3/uL (ref 1.7–7.7)
Neutrophils Relative %: 66 %
PLATELETS: 426 10*3/uL — AB (ref 150–400)
RBC: 4.35 MIL/uL (ref 4.22–5.81)
RDW: 15.6 % — AB (ref 11.5–15.5)
WBC: 10.8 10*3/uL — ABNORMAL HIGH (ref 4.0–10.5)

## 2016-11-17 LAB — MAGNESIUM: MAGNESIUM: 2.1 mg/dL (ref 1.7–2.4)

## 2016-11-17 LAB — PHOSPHORUS: PHOSPHORUS: 3.7 mg/dL (ref 2.5–4.6)

## 2016-11-17 MED ORDER — SODIUM CHLORIDE 0.9 % IV SOLN
INTRAVENOUS | Status: AC
  Administered 2016-11-17 (×3): via INTRAVENOUS_CENTRAL
  Filled 2016-11-17 (×3): qty 200

## 2016-11-17 MED ORDER — CALCIUM CARBONATE ANTACID 500 MG PO CHEW
CHEWABLE_TABLET | ORAL | Status: AC
Start: 2016-11-17 — End: 2016-11-17
  Administered 2016-11-17: 400 mg
  Filled 2016-11-17: qty 2

## 2016-11-17 MED ORDER — ACETAMINOPHEN 325 MG PO TABS
ORAL_TABLET | ORAL | Status: AC
  Filled 2016-11-17: qty 2

## 2016-11-17 MED ORDER — ACETAMINOPHEN 325 MG PO TABS
650.0000 mg | ORAL_TABLET | ORAL | Status: DC | PRN
  Administered 2016-11-17: 650 mg via ORAL

## 2016-11-17 MED ORDER — BISACODYL 5 MG PO TBEC
5.0000 mg | DELAYED_RELEASE_TABLET | Freq: Every day | ORAL | Status: DC | PRN
  Administered 2016-11-17 (×2): 5 mg via ORAL
  Filled 2016-11-17 (×2): qty 1

## 2016-11-17 MED ORDER — DIPHENHYDRAMINE HCL 25 MG PO CAPS
ORAL_CAPSULE | ORAL | Status: AC
  Filled 2016-11-17: qty 1

## 2016-11-17 MED ORDER — SODIUM CHLORIDE 0.9 % IV SOLN
2.0000 g | Freq: Once | INTRAVENOUS | Status: AC
  Administered 2016-11-17: 2 g via INTRAVENOUS
  Filled 2016-11-17: qty 20

## 2016-11-17 MED ORDER — POLYETHYLENE GLYCOL 3350 17 G PO PACK
17.0000 g | PACK | Freq: Two times a day (BID) | ORAL | Status: DC
  Administered 2016-11-17 – 2016-11-23 (×6): 17 g via ORAL
  Filled 2016-11-17 (×9): qty 1

## 2016-11-17 MED ORDER — ACD FORMULA A 0.73-2.45-2.2 GM/100ML VI SOLN
500.0000 mL | Status: DC
  Administered 2016-11-17: 500 mL via INTRAVENOUS
  Filled 2016-11-17: qty 500

## 2016-11-17 MED ORDER — ACD FORMULA A 0.73-2.45-2.2 GM/100ML VI SOLN
Status: AC
  Administered 2016-11-17: 500 mL
  Filled 2016-11-17: qty 1000

## 2016-11-17 MED ORDER — SENNOSIDES-DOCUSATE SODIUM 8.6-50 MG PO TABS
2.0000 | ORAL_TABLET | Freq: Two times a day (BID) | ORAL | Status: DC
  Administered 2016-11-18 – 2016-11-23 (×5): 2 via ORAL
  Filled 2016-11-17 (×8): qty 2

## 2016-11-17 MED ORDER — SODIUM CHLORIDE 0.9 % IV SOLN
Freq: Once | INTRAVENOUS | Status: DC
  Filled 2016-11-17: qty 200

## 2016-11-17 MED ORDER — HEPARIN SODIUM (PORCINE) 1000 UNIT/ML IJ SOLN
1000.0000 [IU] | Freq: Once | INTRAMUSCULAR | Status: DC
  Filled 2016-11-17: qty 1

## 2016-11-17 MED ORDER — DIPHENHYDRAMINE HCL 25 MG PO CAPS
25.0000 mg | ORAL_CAPSULE | Freq: Four times a day (QID) | ORAL | Status: DC | PRN
  Administered 2016-11-17: 25 mg via ORAL

## 2016-11-17 MED ORDER — BISACODYL 10 MG RE SUPP
10.0000 mg | Freq: Once | RECTAL | Status: AC
Start: 2016-11-17 — End: 2016-11-17
  Administered 2016-11-17: 10 mg via RECTAL
  Filled 2016-11-17: qty 1

## 2016-11-17 NOTE — Progress Notes (Signed)
Large

## 2016-11-17 NOTE — Progress Notes (Signed)
I met with pt and his wife at bedside yesterday afternoon to begin discussions concerning a possible inpt rehab admit pending insurance approval when pt medically ready. Wife unable to provide 24/7 supervision at d/c which may be unrealistic which I did discuss with them. Wife has scheduled a tour of our rehab for Tuesday at 2 pm. Patient would like permission to tour also at that time. I stated that his attending MD would have to give permission to tour via wheelchair without telemetry monitoring. I will follow up on Monday. [phone]

## 2016-11-17 NOTE — Progress Notes (Addendum)
PROGRESS NOTE    Fabrizio Filip  TDV:[id number] DOB: January 10, 1941 DOA: 11/12/2016 PCP: Lavone Orn, MD   Brief Narrative:  Storm Sovine is a 76 y.o. malewith medical history significant of the Essential HTN, Prostate cancer s/p prostatectomy, ankylosing spondylitis, CIPD and Paget's disease. Patient presents with bilateral lower extremity weakness. Patient Unable to ambulate now. Patient has had extensive workup including MRI, PET scan, and EMG studies between here and [facility]. 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 Previously tried on IVIG. Other working differentials included paraneoplastic syndrome or chronic inflammatory polyradiculopathy. Neuorlogy consulted and evaluated and recommending Plasmapheresis (PLEX) for 5 days. PT evaluated and feel like patient is a candidate for CIR; Physiatrist evaluated and recommended CIR. Rehab Coordinator to meet with Patient and Family yesterday and wife toured [employer]. Will D/C patient to Rehab once medically stable.    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  -Recently had IVIG during last Hospitalization  -Neurology Consulted and recommending 5 day course of Plasmapheresis; Treatment #3 today -Labs sent including ESR (47), CRP (<0.8), vitamin B12 was 495, vitamin B1 was 139.3, copper was 108, folate was 26.0, SPEP with IFE showed slight increase in Alpha-2-Globulin, GM1 antibody, Lyme Negative, and paraneoplastic antibody panel pending -C/w Neurochecks q6h -Appreciate Neurology evaluation; Neuro Recommending Continuing to  Monitor Pulmonary Function and O2  -Supplemental Hydration with NS at 75 mL/hr D/C'd for now; May restart in AM  Dysphagia -SLP evaluated Recommending Soft Diet Thin Liquids  -MBS done and SLP Diet Recommendations of Regular Solids Thin Liquids -Recommending Medication administration as whole meds with puree  Essential Hypertension -C/w Metoprolol 25 mg po Daily and Lisinopril 10 mg po Daily -Added IV Hydralazine 10 mg q6hprn for SBP >180 or DBP >100 -D/C'd IVF with NS at 75 mL/hr as patient continues to have elevated BP and was urinating a lot last night   Hx of Prostate Cancer and Paget's Disease  -Follow up with PCP and Oncology as an outpatient;  -Recent PET Scan in June did not show Malignancy  -Paraneoplastic Antibody Panel Sent -Outpatient Neurologist would favor repeating Malignancy Evaluation  -Will discuss with Oncology in AM   Hypophosphatemia, improved -Phos Level now 3.7 -Continue to Monitor and Replete as Necessary -Repeat Phos Level in AM   Leukocytosis -Mild. WBC went from 9.9 -> 10.6 -> 10.8 -Suspect Reactive  -Continue to Monitor for S/Sx of Infection; If worsens will Pan-Culture -Repeat CBC in AM   Abnormal LFT's -Improved, AST is now 29 and ALT is now 15 -Continue to Trend  Hyperbilirubinemia -Mild -Patients T Bili increased from 0.9 -> 1.4 -Continue to Monitor and Repeat in AM  Constipation -Added Senna-Docusate 2 tab po BID, Miralax 17 g po BID, -Discontinued Docusate 100 mg po BID -C/w Bisacodyl 5 mg po Daily prn Moderate Constipation  DVT prophylaxis: Enoxaparin 40 mg sq q24h Code Status: FULL CODE Family Communication: Discussed with Family at bedside  Disposition Plan: CIR when medically stable.   Consultants:   Neurology  CIR   Procedures:  PLEX   Antimicrobials:  Anti-infectives  None     Subjective: Seen and examined in Dialysis getting PLEX and he stated he was doing ok. Was worried about urinating a lot after his  Session. Also was concerned about Constipation as he has not had a bowel movement in over a week. No other concerns or complaints at this time.   Objective: Vitals:   11/17/16 1000 11/17/16 1015 11/17/16 1030 11/17/16 1035  BP: 122/70 125/75 125/72 125/70  Pulse: (!) 102 (!) 103 97 93  Resp: (!) _0 Temp:  98.6 F (37 C)  98.2 F (36.8 C)  TempSrc:  Oral  Tympanic  SpO2:      Weight:      Height:        Intake/Output Summary (Last 24 hours) at 11/17/16 1528 Last data filed at 11/17/16 0959  Gross per 24 hour  Intake              570 ml  Output              451 ml  Net              119 ml   Filed Weights   11/16/16 0440 11/17/16 0401 11/17/16 0835  Weight: 68.8 kg (151 lb 9.6 oz) 67 kg (147 lb 12.8 oz) 71 kg (156 lb 8.4 oz)   Examination: Physical Exam:  Constitutional: Pleasant Caucasian Male in NAD in dialysis getting Plasma Exchange.  Eyes: Sclerae anicteric; Conjunctivae non-injected ENMT: Grossly normal hearing; Mucous membranes appeared moist Neck: Supple with no JVD Respiratory: CTAB; No wheezing/rales/rhonchi. Patient was not tachypenic or using any accessory muscles to breathe Cardiovascular: RRR; S1; S2; No LE edema Abdomen: Soft, NT, ND. Bowel sounds present GU: Deferred Musculoskeletal: No contractures; No cyanosis Skin: Warm and dry; No rashes or lesions on a limited skin eval Neurologic: CN 2-12 grossly intact. Not as tremulous today. Still weak in LE Psychiatric: Pleasant mood and affect. Intact judgement and insight  Data Reviewed: I have personally reviewed following labs and imaging studies  CBC:  Recent Labs Lab 11/12/16 2030 11/13/16 1031 11/13/16 1512 11/14/16 0954 11/15/16 1002 11/16/16 0250 11/17/16 0339  WBC 11.9* 9.8  --  12.2* 9.9 10.6* 10.8*  NEUTROABS 9.1*  --   --  9.6* 7.2 7.0 7.2  HGB 15.4 15.0 14.3 14.7 14.6 14.4 14.1  HCT 45.3 44.1 42.0 43.9 43.8 42.9 41.7  MCV 95.4 96.3  --  96.3 96.1 95.3 95.9  PLT 454* 443*  --   423* 386 394 250*   Basic Metabolic Panel:  Recent Labs Lab 11/13/16 1031 11/13/16 1512 11/14/16 1139 11/15/16 1002 11/16/16 0250 11/17/16 0339  NA 137 139 138 138 138 141  K 5.1 4.1 4.0 3.8 3.6 4.5  CL 104 103 109 108 112* 110  CO2 27  --  24 24 20* 28  GLUCOSE 108* 104* 82 108* 104* 102*  BUN 27* 26* _1 CREATININE 0.79 0.60* 0.61 0.49* 0.48* 0.60*  CALCIUM 8.7*  --  8.2* 8.2* 8.3* 8.5*  MG  --   --  2.0 1.9 1.8 2.1  PHOS  --   --  2.3* 2.5 3.3 3.7   GFR: Estimated Creatinine Clearance: 76.4 mL/min (A) (by C-G formula based on SCr of 0.6 mg/dL (L)). Liver Function Tests:  Recent Labs Lab 11/13/16 1031 11/14/16 1139 11/15/16 1002 11/16/16 0250 11/17/16 0339  AST 47* _2 ALT 76* 33 34 17 15*  ALKPHOS  59 32* 38 22* 30*  BILITOT 0.7 0.9 0.9 0.9 1.4*  PROT 6.4* 5.4* 5.5* 4.9* 4.7*  ALBUMIN 3.0* 3.7 3.5 3.9 3.4*   No results for input(s): LIPASE, AMYLASE in the last 168 hours. No results for input(s): AMMONIA in the last 168 hours. Coagulation Profile:  Recent Labs Lab 11/13/16 1031  INR 0.97   Cardiac Enzymes: No results for input(s): CKTOTAL, CKMB, CKMBINDEX, TROPONINI in the last 168 hours. BNP (last 3 results) No results for input(s): PROBNP in the last 8760 hours. HbA1C: No results for input(s): HGBA1C in the last 72 hours. CBG: No results for input(s): GLUCAP in the last 168 hours. Lipid Profile: No results for input(s): CHOL, HDL, LDLCALC, TRIG, CHOLHDL, LDLDIRECT in the last 72 hours. Thyroid Function Tests: No results for input(s): TSH, T4TOTAL, FREET4, T3FREE, THYROIDAB in the last 72 hours. Anemia Panel: No results for input(s): VITAMINB12, FOLATE, FERRITIN, TIBC, IRON, RETICCTPCT in the last 72 hours. Sepsis Labs: No results for input(s): PROCALCITON, LATICACIDVEN in the last 168 hours.  No results found for this or any previous visit (from the past 240 hour(s)).   Radiology Studies: No results found. Scheduled Meds: .  aspirin  81 mg Oral Daily  . bisacodyl  10 mg Rectal Once  . diphenhydrAMINE      . enoxaparin (LOVENOX) injection  40 mg Subcutaneous Q24H  . lisinopril  10 mg Oral Daily  . metoprolol succinate  25 mg Oral Daily  . polyethylene glycol  17 g Oral BID  . senna-docusate  2 tablet Oral BID   Continuous Infusions: . potassium PHOSPHATE IVPB (mmol)      LOS: 5 days   Kerney Elbe, DO [facility] Pager [phone]  If 7PM-7AM, please contact night-coverage [email or web address] Password [facility] 11/17/2016, 3:28 PM

## 2016-11-17 NOTE — [place] FL2 (Signed)
[place] MEDICAID FL2 LEVEL OF CARE SCREENING TOOL     IDENTIFICATION  Patient Name: Eduardo Burch Birthdate: 1940/04/17 Sex: male Admission Date (Current Location): 11/12/2016  [facility] and [place] Number:  Herbalist and Address:  [facility] [place], [place] [address], [place], [place] [address]      Provider Number: [id number]  Attending Physician Name and Address:  Kerney Elbe, DO  Relative Name and Phone Number:  Arville Go, spouse, [phone]    Current Level of Care: Hospital Recommended Level of Care: [place] Prior Approval Number:    Date Approved/Denied:   PASRR Number: [id number]  Discharge Plan: SNF    Current Diagnoses: Patient Active Problem List   Diagnosis Date Noted  . Leukocytosis 11/14/2016  . Hypophosphatemia 11/14/2016  . Dysphagia 11/14/2016  . Abnormal LFTs 11/14/2016  . Lower extremity weakness 11/13/2016  . CIDP (chronic inflammatory demyelinating polyneuropathy) ([place]) 11/12/2016  . Back pain without sciatica 10/25/2016  . 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 Hyperlipidemia 10/03/2016  . H/O prostate cancer 10/03/2016  . Hyponatremia 10/03/2016  . Leg weakness 09/28/2016  . Constipated   . Tachycardia 08/16/2016  . Hypertension, essential 08/15/2016    Orientation RESPIRATION BLADDER Height & Weight     Self, Time, Situation, Place  Normal Continent Weight: 71 kg (156 lb 8.4 oz) Height:  6' (182.9 cm)  BEHAVIORAL SYMPTOMS/MOOD NEUROLOGICAL BOWEL NUTRITION STATUS      Continent Diet (Please see DC Summary)  AMBULATORY STATUS COMMUNICATION OF NEEDS Skin   Extensive Assist Verbally Normal                       Personal Care Assistance Level of Assistance  Bathing, Feeding, Dressing Bathing Assistance: Maximum assistance Feeding assistance: Limited assistance Dressing Assistance: Limited assistance     Functional Limitations Info             [place]   PT (By licensed PT), OT (By licensed OT)     PT Frequency: 5x/week OT Frequency: 3x/week            Contractures      Additional Factors Info  Code Status, Allergies Code Status Info: Full Allergies Info:  Remicade Infliximab           Current Medications (11/17/2016):  This is the current hospital active medication list Current Facility-Administered Medications  Medication Dose Route Frequency Provider Last Rate Last Dose  . acetaminophen (TYLENOL) tablet 650 mg  650 mg Oral Q4H PRN Fuller Plan A, MD       Or  . acetaminophen (TYLENOL) solution 650 mg  650 mg Per Tube Q4H PRN Fuller Plan A, MD       Or  . acetaminophen (TYLENOL) suppository 650 mg  650 mg Rectal Q4H PRN Fuller Plan A, MD      . aspirin chewable tablet 81 mg  81 mg Oral Daily Smith, Rondell A, MD   81 mg at 11/17/16 1200  . bisacodyl (DULCOLAX) EC tablet 5 mg  5 mg Oral Daily PRN Lovey Newcomer T, NP   5 mg at 11/17/16 1157  . diphenhydrAMINE (BENADRYL) 25 mg capsule           . docusate sodium (COLACE) capsule 100 mg  100 mg Oral BID Fuller Plan A, MD   100 mg at 11/17/16 1157  . enoxaparin (LOVENOX) injection 40 mg  40 mg Subcutaneous Q24H Norval Morton, MD  40 mg at 11/17/16 1159  . hydrALAZINE (APRESOLINE) injection 10 mg  10 mg Intravenous Q6H PRN Sheikh, Omair Latif, DO      . lisinopril (PRINIVIL,ZESTRIL) tablet 10 mg  10 mg Oral Daily Smith, Rondell A, MD   10 mg at 11/17/16 1200  . metoprolol succinate (TOPROL-XL) 24 hr tablet 25 mg  25 mg Oral Daily Smith, Rondell A, MD   25 mg at 11/17/16 1200  . potassium PHOSPHATE 20 mmol in dextrose 5 % 500 mL infusion  20 mmol Intravenous Once Sheikh, Omair Latif, DO      . tiZANidine (ZANAFLEX) tablet 2 mg  2 mg Oral Q8H PRN Fuller Plan A, MD      . traMADol (ULTRAM) tablet 50 mg  50 mg Oral Q6H PRN Norval Morton, MD         Discharge Medications: Please see discharge summary for a list of discharge medications.  Relevant Imaging  Results:  Relevant Lab Results:   Additional Information SSN: [id number]  Lissa Morales [place], [place]

## 2016-11-18 LAB — CBC WITH DIFFERENTIAL/PLATELET
BASOS ABS: 0 10*3/uL (ref 0.0–0.1)
BASOS PCT: 0 %
Basophils Absolute: 0 10*3/uL (ref 0.0–0.1)
Basophils Relative: 0 %
EOS PCT: 1 %
Eosinophils Absolute: 0.1 10*3/uL (ref 0.0–0.7)
Eosinophils Absolute: 0.1 10*3/uL (ref 0.0–0.7)
Eosinophils Relative: 1 %
HEMATOCRIT: 39.3 % (ref 39.0–52.0)
HEMATOCRIT: 38.8 % — AB (ref 39.0–52.0)
Hemoglobin: 13.4 g/dL (ref 13.0–17.0)
Hemoglobin: 12.9 g/dL — ABNORMAL LOW (ref 13.0–17.0)
LYMPHS PCT: 29 %
LYMPHS PCT: 19 %
Lymphs Abs: 2.2 10*3/uL (ref 0.7–4.0)
Lymphs Abs: 1.7 10*3/uL (ref 0.7–4.0)
MCH: 32.5 pg (ref 26.0–34.0)
MCH: 32.1 pg (ref 26.0–34.0)
MCHC: 34.1 g/dL (ref 30.0–36.0)
MCHC: 33.2 g/dL (ref 30.0–36.0)
MCV: 95.4 fL (ref 78.0–100.0)
MCV: 96.5 fL (ref 78.0–100.0)
MONO ABS: 0.7 10*3/uL (ref 0.1–1.0)
MONOS PCT: 8 %
Monocytes Absolute: 0.5 10*3/uL (ref 0.1–1.0)
Monocytes Relative: 7 %
NEUTROS ABS: 4.8 10*3/uL (ref 1.7–7.7)
NEUTROS ABS: 6.5 10*3/uL (ref 1.7–7.7)
NEUTROS PCT: 62 %
Neutrophils Relative %: 72 %
Platelets: 415 10*3/uL — ABNORMAL HIGH (ref 150–400)
RBC: 4.12 MIL/uL — AB (ref 4.22–5.81)
RBC: 4.02 MIL/uL — ABNORMAL LOW (ref 4.22–5.81)
RDW: 15.4 % (ref 11.5–15.5)
RDW: 15.6 % — ABNORMAL HIGH (ref 11.5–15.5)
WBC: 7.6 10*3/uL (ref 4.0–10.5)
WBC: 9 10*3/uL (ref 4.0–10.5)

## 2016-11-18 LAB — COMPREHENSIVE METABOLIC PANEL
ALK PHOS: 21 U/L — AB (ref 38–126)
ALT: 13 U/L — AB (ref 17–63)
AST: 19 U/L (ref 15–41)
Albumin: 3.4 g/dL — ABNORMAL LOW (ref 3.5–5.0)
Anion gap: 6 (ref 5–15)
BUN: 17 mg/dL (ref 6–20)
CHLORIDE: 111 mmol/L (ref 101–111)
CO2: 23 mmol/L (ref 22–32)
Calcium: 8.3 mg/dL — ABNORMAL LOW (ref 8.9–10.3)
Creatinine, Ser: 0.5 mg/dL — ABNORMAL LOW (ref 0.61–1.24)
GFR calc Af Amer: >60 mL/min (ref >60)
Glucose, Bld: 103 mg/dL — ABNORMAL HIGH (ref 65–99)
Potassium: 3.6 mmol/L (ref 3.5–5.1)
Sodium: 140 mmol/L (ref 135–145)
Total Bilirubin: 1.2 mg/dL (ref 0.3–1.2)
Total Protein: 4.4 g/dL — ABNORMAL LOW (ref 6.5–8.1)

## 2016-11-18 LAB — MAGNESIUM: MAGNESIUM: 1.8 mg/dL (ref 1.7–2.4)

## 2016-11-18 LAB — PHOSPHORUS: Phosphorus: 3.7 mg/dL (ref 2.5–4.6)

## 2016-11-18 MED ORDER — SODIUM CHLORIDE 0.9 % IV SOLN
INTRAVENOUS | Status: DC
  Administered 2016-11-18: 75 mL via INTRAVENOUS
  Administered 2016-11-19 – 2016-11-20 (×3): via INTRAVENOUS

## 2016-11-18 NOTE — Progress Notes (Addendum)
PROGRESS NOTE    Eduardo Burch  UXY:[id number] DOB: September 07, 1940 DOA: 11/12/2016 PCP: Lavone Orn, MD   Brief Narrative:  Daviel Allegretto is a 76 y.o. malewith medical history significant of the Essential HTN, Prostate cancer s/p prostatectomy, ankylosing spondylitis, CIPD and Paget's disease. Patient presents with bilateral lower extremity weakness. Patient Unable to ambulate now. Patient has had extensive workup including MRI, PET scan, and EMG studies between here and [facility]. 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 Previously tried on IVIG. Other working differentials included paraneoplastic syndrome or chronic inflammatory polyradiculopathy. Neuorlogy consulted and evaluated and recommending Plasmapheresis (PLEX) for 5 days. PT evaluated and feel like patient is a candidate for CIR; Physiatrist evaluated and recommended CIR. Rehab Coordinator to meet with Patient and Family yesterday and wife toured [employer]. Will D/C patient to Rehab once medically stable. Patient's Platelet Count dropped to 23 today but likely was Spurious as when repeated Platelet Count was 415.  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  -Recently had IVIG during last Hospitalization  -Neurology Consulted and recommending 5 day course of Plasmapheresis; Treatment #3 yesterday  -Labs sent including ESR (47), CRP (<0.8), vitamin B12 was 495, vitamin B1 was 139.3, copper was 108, folate was 26.0, SPEP with IFE showed slight increase in Alpha-2-Globulin, GM1 antibody pending, Lyme Negative, and  paraneoplastic antibody panel pending (sent to [facility]) -C/w Neurochecks q6h -Appreciate Neurology evaluation; Neuro Recommending Continuing to Monitor Pulmonary Function and O2  -Supplemental Hydration with NS at 75 mL/hr restarted  Dysphagia -SLP evaluated Recommending Soft Diet Thin Liquids  -MBS done and SLP Diet Recommendations of Regular Solids Thin Liquids -Recommending Medication administration as whole meds with puree  Essential Hypertension -C/w Metoprolol 25 mg po Daily and Lisinopril 10 mg po Daily -Added IV Hydralazine 10 mg q6hprn for SBP >180 or DBP >100 -Restarted IVF with NS at 75 mL/hr   Hx of Prostate Cancer and Paget's Disease  -Follow up with PCP and Oncology as an outpatient;  -Recent PET Scan in June did not show Malignancy  -Paraneoplastic Antibody Panel Sent -Outpatient Neurologist would favor repeating Malignancy Evaluation   Hypophosphatemia, improved -Phos Level now 3.7 -Continue to Monitor and Replete as Necessary -Repeat Phos Level in AM   Leukocytosis, improved -Mild. WBC went from 9.9 -> 10.6 -> 10.8 -> 9.0 -Suspect Reactive  -Continue to Monitor for S/Sx of Infection; If worsens will Pan-Culture -Repeat CBC in AM   Abnormal LFT's, improved  -Improved, AST is now 19 and ALT is now 13 -Continue to Trend  Hyperbilirubinemia, improved -Mild and improved -Patients T Bili increased from 0.9 -> 1.4 -> 1.2 -Continue to Monitor and Repeat in AM  Constipation, improved -Added Senna-Docusate 2 tab po BID, Miralax 17 g po BID, -Discontinued Docusate 100 mg po BID -C/w Bisacodyl 5 mg po Daily prn Moderate Constipation -Given Bisacodyl 10 mg suppository yesterday   Spurious Drop in Platelets -Platelet Count went from 426 -> 23 -Was discussing Case with Dr. Irene Limbo but repeat CBC ordered and while I was talking with Dr. Irene Limbo repeat CBC resulted -Repeated and  was 415 -Continue to Monitor and repeat CBC in AM  DVT prophylaxis: Enoxaparin 40 mg sq  q24h Code Status: FULL CODE Family Communication: Discussed with Wife at bedside Disposition Plan: CIR when medically stable.   Consultants:   Neurology  CIR  Discussed Case with Hematology Dr. Irene Limbo    Procedures:  PLEX   Antimicrobials:  Anti-infectives    None     Subjective: Seen and examined this AM and stated he had the best night he has had last night and had a bowel movement yesterday. No CP or SOB. Has not been out of bed. Was happy that his dog came to visit him.   Objective: Vitals:   11/18/16 0700 11/18/16 0800 11/18/16 0900 11/18/16 1000  BP:      Pulse: 87 99 93 89  Resp: (!) 21 (!) '28 20 20  ' Temp:      TempSrc:      SpO2: 99% 100% 100% 100%  Weight:      Height:        Intake/Output Summary (Last 24 hours) at 11/18/16 1628 Last data filed at 11/18/16 1511  Gross per 24 hour  Intake           1267.5 ml  Output              241 ml  Net           1026.5 ml   Filed Weights   11/17/16 0401 11/17/16 0835 11/18/16 0500  Weight: 67 kg (147 lb 12.8 oz) 71 kg (156 lb 8.4 oz) 69.3 kg (152 lb 12.5 oz)   Examination: Physical Exam:  Constitutional: Pleasant Caucasian male in NAD; States he is feeling good this AM.  Eyes: Sclerae anicteric; Conjunctivae non-injected ENMT: Grossly normal hearing. Mucous Membranes appear moist Neck: Supple with no JVD Respiratory: CTAB; No wheezing/rales/rhonchi. Unlabored breathing Cardiovascular: RRR; S1, S2. No LE Edema Abdomen: Soft, NT, ND. Bowel sounds present GU: Deferred Musculoskeletal: No contractures; No cyanosis  Skin: Warm and dry. No rashes or lesions on a limited skin eval Neurologic: CN 2-12 grossly intact. Had some tremors. Bilateral weakness noted with Left worse than Right Psychiatric: Pleasant mood and affect. Intact judgement and insight  Data Reviewed: I have personally reviewed following labs and imaging studies  CBC:  Recent Labs Lab 11/15/16 1002 11/16/16 0250 11/17/16 0339  11/18/16 0712 11/18/16 1046  WBC 9.9 10.6* 10.8* 7.6 9.0  NEUTROABS 7.2 7.0 7.2 4.8 6.5  HGB 14.6 14.4 14.1 13.4 12.9*  HCT 43.8 42.9 41.7 39.3 38.8*  MCV 96.1 95.3 95.9 95.4 96.5  PLT 386 394 426* 23* 972*   Basic Metabolic Panel:  Recent Labs Lab 11/14/16 1139 11/15/16 1002 11/16/16 0250 11/17/16 0339 11/18/16 0712  NA 138 138 138 141 140  K 4.0 3.8 3.6 4.5 3.6  CL 109 108 112* 110 111  CO2 24 24 20* 28 23  GLUCOSE 82 108* 104* 102* 103*  BUN '11 11 11 18 17  ' CREATININE 0.61 0.49* 0.48* 0.60* 0.50*  CALCIUM 8.2* 8.2* 8.3* 8.5* 8.3*  MG 2.0 1.9 1.8 2.1 1.8  PHOS 2.3* 2.5 3.3 3.7 3.7   GFR: Estimated Creatinine Clearance: 74.6 mL/min (A) (by C-G formula based on SCr of 0.5 mg/dL (L)). Liver Function Tests:  Recent Labs Lab 11/14/16 1139 11/15/16 1002 11/16/16 0250 11/17/16 0339 11/18/16 0712  AST '28 28 18 29 19  ' ALT 33 34 17 15* 13*  ALKPHOS 32* 38 22* 30* 21*  BILITOT 0.9  0.9 0.9 1.4* 1.2  PROT 5.4* 5.5* 4.9* 4.7* 4.4*  ALBUMIN 3.7 3.5 3.9 3.4* 3.4*   No results for input(s): LIPASE, AMYLASE in the last 168 hours. No results for input(s): AMMONIA in the last 168 hours. Coagulation Profile:  Recent Labs Lab 11/13/16 1031  INR 0.97   Cardiac Enzymes: No results for input(s): CKTOTAL, CKMB, CKMBINDEX, TROPONINI in the last 168 hours. BNP (last 3 results) No results for input(s): PROBNP in the last 8760 hours. HbA1C: No results for input(s): HGBA1C in the last 72 hours. CBG: No results for input(s): GLUCAP in the last 168 hours. Lipid Profile: No results for input(s): CHOL, HDL, LDLCALC, TRIG, CHOLHDL, LDLDIRECT in the last 72 hours. Thyroid Function Tests: No results for input(s): TSH, T4TOTAL, FREET4, T3FREE, THYROIDAB in the last 72 hours. Anemia Panel: No results for input(s): VITAMINB12, FOLATE, FERRITIN, TIBC, IRON, RETICCTPCT in the last 72 hours. Sepsis Labs: No results for input(s): PROCALCITON, LATICACIDVEN in the last 168 hours.  No  results found for this or any previous visit (from the past 240 hour(s)).   Radiology Studies: No results found. Scheduled Meds: . aspirin  81 mg Oral Daily  . enoxaparin (LOVENOX) injection  40 mg Subcutaneous Q24H  . lisinopril  10 mg Oral Daily  . metoprolol succinate  25 mg Oral Daily  . polyethylene glycol  17 g Oral BID  . senna-docusate  2 tablet Oral BID   Continuous Infusions: . sodium chloride 75 mL (11/18/16 1532)  . potassium PHOSPHATE IVPB (mmol)      LOS: 6 days   Kerney Elbe, DO [facility] Pager [phone]  If 7PM-7AM, please contact night-coverage [email or web address] Password [facility] 11/18/2016, 4:28 PM

## 2016-11-19 ENCOUNTER — Encounter: Payer: Self-pay | Admitting: Physical Therapy

## 2016-11-19 ENCOUNTER — Ambulatory Visit: Payer: Medicare Other | Admitting: Physical Therapy

## 2016-11-19 DIAGNOSIS — M6281 Muscle weakness (generalized): Secondary | ICD-10-CM | POA: Diagnosis not present

## 2016-11-19 DIAGNOSIS — M25579 Pain in unspecified ankle and joints of unspecified foot: Secondary | ICD-10-CM

## 2016-11-19 DIAGNOSIS — M25572 Pain in left ankle and joints of left foot: Secondary | ICD-10-CM

## 2016-11-19 LAB — POCT I-STAT, CHEM 8
BUN: 21 mg/dL — ABNORMAL HIGH (ref 6–20)
BUN: 13 mg/dL (ref 6–20)
CREATININE: 0.6 mg/dL — AB (ref 0.61–1.24)
Calcium, Ion: 1.18 mmol/L (ref 1.15–1.40)
Calcium, Ion: 1.15 mmol/L (ref 1.15–1.40)
Chloride: 103 mmol/L (ref 101–111)
Chloride: 103 mmol/L (ref 101–111)
Creatinine, Ser: 0.4 mg/dL — ABNORMAL LOW (ref 0.61–1.24)
GLUCOSE: 147 mg/dL — AB (ref 65–99)
Glucose, Bld: 116 mg/dL — ABNORMAL HIGH (ref 65–99)
HCT: 34 % — ABNORMAL LOW (ref 39.0–52.0)
HEMATOCRIT: 40 % (ref 39.0–52.0)
HEMOGLOBIN: 13.6 g/dL (ref 13.0–17.0)
HEMOGLOBIN: 11.6 g/dL — AB (ref 13.0–17.0)
POTASSIUM: 3.7 mmol/L (ref 3.5–5.1)
Potassium: 3.7 mmol/L (ref 3.5–5.1)
SODIUM: 142 mmol/L (ref 135–145)
SODIUM: 141 mmol/L (ref 135–145)
TCO2: 24 mmol/L (ref 22–32)
TCO2: 24 mmol/L (ref 22–32)

## 2016-11-19 LAB — CBC WITH DIFFERENTIAL/PLATELET
Basophils Absolute: 0 10*3/uL (ref 0.0–0.1)
Basophils Relative: 0 %
Eosinophils Absolute: 0.2 10*3/uL (ref 0.0–0.7)
Eosinophils Relative: 2 %
HEMATOCRIT: 37.7 % — AB (ref 39.0–52.0)
HEMOGLOBIN: 12.8 g/dL — AB (ref 13.0–17.0)
LYMPHS ABS: 2.4 10*3/uL (ref 0.7–4.0)
Lymphocytes Relative: 26 %
MCH: 32.5 pg (ref 26.0–34.0)
MCHC: 34 g/dL (ref 30.0–36.0)
MCV: 95.7 fL (ref 78.0–100.0)
Monocytes Absolute: 0.8 10*3/uL (ref 0.1–1.0)
Monocytes Relative: 9 %
NEUTROS ABS: 5.9 10*3/uL (ref 1.7–7.7)
NEUTROS PCT: 63 %
Platelets: 416 10*3/uL — ABNORMAL HIGH (ref 150–400)
RBC: 3.94 MIL/uL — ABNORMAL LOW (ref 4.22–5.81)
RDW: 15.3 % (ref 11.5–15.5)
WBC: 9.3 10*3/uL (ref 4.0–10.5)

## 2016-11-19 LAB — COMPREHENSIVE METABOLIC PANEL
ALK PHOS: 26 U/L — AB (ref 38–126)
ALT: 13 U/L — ABNORMAL LOW (ref 17–63)
ANION GAP: 5 (ref 5–15)
AST: 21 U/L (ref 15–41)
Albumin: 3.2 g/dL — ABNORMAL LOW (ref 3.5–5.0)
BILIRUBIN TOTAL: 0.6 mg/dL (ref 0.3–1.2)
BUN: 16 mg/dL (ref 6–20)
CALCIUM: 8.1 mg/dL — AB (ref 8.9–10.3)
CO2: 23 mmol/L (ref 22–32)
Chloride: 110 mmol/L (ref 101–111)
Creatinine, Ser: 0.46 mg/dL — ABNORMAL LOW (ref 0.61–1.24)
Glucose, Bld: 99 mg/dL (ref 65–99)
Potassium: 3.5 mmol/L (ref 3.5–5.1)
Sodium: 138 mmol/L (ref 135–145)
TOTAL PROTEIN: 4.5 g/dL — AB (ref 6.5–8.1)

## 2016-11-19 LAB — MAGNESIUM: Magnesium: 1.8 mg/dL (ref 1.7–2.4)

## 2016-11-19 LAB — PHOSPHORUS: PHOSPHORUS: 3.4 mg/dL (ref 2.5–4.6)

## 2016-11-19 MED ORDER — ACD FORMULA A 0.73-2.45-2.2 GM/100ML VI SOLN
500.0000 mL | Status: DC
  Administered 2016-11-19: 500 mL via INTRAVENOUS
  Filled 2016-11-19 (×2): qty 500

## 2016-11-19 MED ORDER — ACETAMINOPHEN 325 MG PO TABS
ORAL_TABLET | ORAL | Status: AC
  Filled 2016-11-19: qty 2

## 2016-11-19 MED ORDER — ACETAMINOPHEN 325 MG PO TABS
650.0000 mg | ORAL_TABLET | ORAL | Status: DC | PRN
  Administered 2016-11-19: 650 mg via ORAL

## 2016-11-19 MED ORDER — DIPHENHYDRAMINE HCL 25 MG PO CAPS
ORAL_CAPSULE | ORAL | Status: AC
  Filled 2016-11-19: qty 1

## 2016-11-19 MED ORDER — SODIUM CHLORIDE 0.9 % IV SOLN
INTRAVENOUS | Status: AC
  Administered 2016-11-19 (×3): via INTRAVENOUS_CENTRAL
  Filled 2016-11-19 (×3): qty 200

## 2016-11-19 MED ORDER — SODIUM CHLORIDE 0.9 % IV SOLN
2.0000 g | Freq: Once | INTRAVENOUS | Status: AC
  Administered 2016-11-19: 2 g via INTRAVENOUS
  Filled 2016-11-19: qty 20

## 2016-11-19 MED ORDER — CALCIUM CARBONATE ANTACID 500 MG PO CHEW
CHEWABLE_TABLET | ORAL | Status: AC
  Administered 2016-11-19: 400 mg
  Filled 2016-11-19: qty 2

## 2016-11-19 MED ORDER — ACD FORMULA A 0.73-2.45-2.2 GM/100ML VI SOLN
Status: AC
  Administered 2016-11-19: 500 mL
  Filled 2016-11-19: qty 1000

## 2016-11-19 MED ORDER — DIPHENHYDRAMINE HCL 25 MG PO CAPS
25.0000 mg | ORAL_CAPSULE | Freq: Four times a day (QID) | ORAL | Status: DC | PRN
  Administered 2016-11-19 – 2016-11-21 (×2): 25 mg via ORAL

## 2016-11-19 MED ORDER — CALCIUM CARBONATE ANTACID 500 MG PO CHEW
1.0000 | CHEWABLE_TABLET | Freq: Once | ORAL | Status: AC
  Administered 2016-11-19: 200 mg via ORAL

## 2016-11-19 MED ORDER — HEPARIN SODIUM (PORCINE) 1000 UNIT/ML IJ SOLN: 1000.0000 [IU] | Freq: Once | INTRAMUSCULAR | Status: DC

## 2016-11-19 NOTE — Progress Notes (Signed)
OT Cancellation Note  Patient Details Name: Gery Sabedra MRN: [id number] DOB: 01/28/1941   Cancelled Treatment:    Reason Eval/Treat Not Completed: Fatigue/lethargy limiting ability to participate.  Attempted to see pt for bed level therex, however, pt reports he's had a rough day and is exhausted.  Will try back.  Cash, OTR/L [phone]   Lucille Passy M 11/19/2016, 4:38 PM

## 2016-11-19 NOTE — Progress Notes (Signed)
Neurology progress note  Subjective Patient seen and examined. Reports feeling some pain in his right lower extremity. He says that he is now had this kind of a sensation, other than when he was first diagnosed with CIDP, when he had this pain in his lower back. He also has pain in lower back. He sounded very frustrated. Today's day#4 for plasma exchange.  Objective Vitals:   11/19/16 1100 11/19/16 1300  BP: (!) 164/80 (!) 175/96  Pulse: 88 84  Resp: 20 (!) 22  Temp: (!) 97.5 F (36.4 C) 98.3 F (36.8 C)  SpO2: 100%   Mental status: Awake alert oriented 3. Speech clear. Naming, compression and repetition intact. Cranial nerves: 2-12 intact Motor:bilateral upper extremity 4/5. Right lower extremity: 2-3/5 hip flexor, 3/5 hip extension, 3/5 knee extension, 4/5 knee flexion, 5/5 plantar and dorsiflexion. Left lower extremity: 2-3/5 hip flexion, 2-3/5 hip extension, 3/5 knee extension, 4/5 knee flexion, 5/5 plantar and dorsiflexion. Sensory exam: Intact to light touch all over. He reports some tingling on the right thigh but not dysesthesia or allodynia. Completely areflexic Action tremor present bilaterally, no dysmetria. Unable to testgait   Current Facility-Administered Medications:  .  0.9 %  sodium chloride infusion, , Intravenous, Continuous, Raiford Noble [place], [place], Last Rate: 75 mL/hr at 11/19/16 0447 .  acetaminophen (TYLENOL) tablet 650 mg, 650 mg, Oral, Q4H PRN, 650 mg at 11/19/16 0441 **OR** acetaminophen (TYLENOL) solution 650 mg, 650 mg, Per Tube, Q4H PRN **OR** acetaminophen (TYLENOL) suppository 650 mg, 650 mg, Rectal, Q4H PRN, Smith, Rondell A, MD .  acetaminophen (TYLENOL) tablet 650 mg, 650 mg, Oral, Q4H PRN, Greta Doom, MD, 650 mg at 11/19/16 0917 .  aspirin chewable tablet 81 mg, 81 mg, Oral, Daily, Smith, Rondell A, MD, 81 mg at 11/19/16 1240 .  bisacodyl (DULCOLAX) EC tablet 5 mg, 5 mg, Oral, Daily PRN, Blount, Xenia T, NP, 5 mg at 11/17/16 1157 .   citrate dextrose (ACD-A anticoagulant) solution 500 mL, 500 mL, Intravenous, Continuous, Greta Doom, MD, 500 mL at 11/19/16 0917 .  diphenhydrAMINE (BENADRYL) capsule 25 mg, 25 mg, Oral, Q6H PRN, Greta Doom, MD, 25 mg at 11/19/16 0916 .  enoxaparin (LOVENOX) injection 40 mg, 40 mg, Subcutaneous, Q24H, Smith, Rondell A, MD, Stopped at 11/18/16 1000 .  heparin injection 1,000 Units, 1,000 Units, Intracatheter, Once, Greta Doom, MD, Stopped at 11/19/16 1223 .  hydrALAZINE (APRESOLINE) injection 10 mg, 10 mg, Intravenous, Q6H PRN, Sheikh, Omair Latif, DO .  lisinopril (PRINIVIL,ZESTRIL) tablet 10 mg, 10 mg, Oral, Daily, Smith, Rondell A, MD, 10 mg at 11/19/16 1241 .  metoprolol succinate (TOPROL-XL) 24 hr tablet 25 mg, 25 mg, Oral, Daily, Smith, Rondell A, MD, 25 mg at 11/19/16 1239 .  polyethylene glycol (MIRALAX / GLYCOLAX) packet 17 g, 17 g, Oral, BID, Raiford Noble [place], [place], 17 g at 11/18/16 2134 .  potassium PHOSPHATE 20 mmol in dextrose 5 % 500 mL infusion, 20 mmol, Intravenous, Once, Sheikh, Omair Latif, DO .  senna-docusate (Senokot-S) tablet 2 tablet, 2 tablet, Oral, BID, Raiford Noble [place], [place], 2 tablet at 11/18/16 2133 .  tiZANidine (ZANAFLEX) tablet 2 mg, 2 mg, Oral, Q8H PRN, Fuller Plan A, MD, 2 mg at 11/19/16 0504 .  traMADol (ULTRAM) tablet 50 mg, 50 mg, Oral, Q6H PRN, Fuller Plan A, MD, 50 mg at 11/19/16 1330  Assessment 76 year old man with worsening/progression of his CIDP. He was seen last month, an LP was performed that showed albumin low cytological dissociation.  He received 5 days of IVIG with minimal improvement in his symptoms. 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 He was also evaluated by Dr. Posey Pronto at the [facility] neurology, who recommends thatsome additional labs be checked, which have been ordered including  paraneoplastic panel.  Impression Guillain-Barr syndrome variant  Recommendations -Continue with 5 rounds of plasma exchange. -PT/OT once done with PLEX as he has a femoral line for the PLEX. -given the history of weight loss I have taken the liberty to order a CT chest abdomen and pelvis with contrast to evaluate for any underlying malignancy, also given the fact that he has a history of prostate cancer. -tylenol and tramadol for pain as needed. -we will continue to follow  Amie Portland, MD [facility] [phone]  If 7pm to 7am, please call on call as listed on AMION.

## 2016-11-19 NOTE — Care Management Important Message (Signed)
Important Message  Patient Details  Name: Eduardo Burch MRN: [id number] Date of Birth: [birth year]   Medicare Important Message Given:  Yes    [name] May 11/19/2016, 10:48 AM

## 2016-11-19 NOTE — Therapy (Signed)
[facility] [address] [place], [place], [address] Phone: [phone]   Fax:  [phone]  Patient Details  Name: Eduardo Burch MRN: [id number] Date of Birth: [birth year] Referring Provider:  No ref. provider found  Encounter Date: 2016-12-09  PHYSICAL THERAPY DISCHARGE SUMMARY  Visits from Start of Care: 4  Current functional level related to goals / functional outcomes: Unable to formally assess due to pt admitted to hospital due to continued progression of weakness and inability to safely ambulate.  Per wife's report pt to D/C to SNF.     Remaining deficits: Impaired sensation, strength, impaired balance, gait and activity tolerance   Education / Equipment: HEP  Plan: Patient agrees to discharge.  Patient goals were not met. Patient is being discharged due to a change in medical status.  ?????        G-Codes - 12/09/2016 0806    Functional Assessment Tool Used (Outpatient Only) At last visit pt required use of RW and w/c for safe mobility; pt admitted to hospital due to progressive inability to ambulate and D/C to SNF   Functional Limitation Mobility: Walking and moving around   Mobility: Walking and Moving Around Goal Status [phone]) At least 20 percent but less than 40 percent impaired, limited or restricted   Mobility: Walking and Moving Around Discharge Status [phone] At least 80 percent but less than 100 percent impaired, limited or restricted      Raylene Everts, PT, DPT 12-09-2016    8:11 AM    [facility] [address] [place], [place], [address] Phone: [phone]   Fax:  [phone]

## 2016-11-19 NOTE — Progress Notes (Signed)
PT Cancellation Note  Patient Details Name: Eduardo Burch MRN: [id number] DOB: 1940/10/04   Cancelled Treatment:    Reason Eval/Treat Not Completed: Patient at procedure or test/unavailable; patient not in the room.  Will attempt later as time permits.    Reginia Naas 11/19/2016, 12:23 PM Magda Kiel, [place] 11/19/2016

## 2016-11-19 NOTE — Progress Notes (Signed)
SLP Cancellation Note  Patient Details Name: Littleton Haub MRN: [id number] DOB: 11-11-1940   Cancelled treatment:       Reason Eval/Treat Not Completed: Patient declined, no reason specified. Patient politely declined therapy this afternoon, requesting for SLP to return in am.   Gabriel Rainwater [place], [place] [phone]    Gabriel Rainwater [place] 11/19/2016, 3:02 PM

## 2016-11-19 NOTE — Progress Notes (Signed)
Noted pt with femoral line for PLEX. Unable to progress out of bed with therapy. I will follow his progress to assist with planning dispo as appropriate.[phone]

## 2016-11-19 NOTE — Progress Notes (Addendum)
PROGRESS NOTE    Eduardo Burch  IOX:[id number] DOB: 1940/04/30 DOA: 11/12/2016 PCP: Lavone Orn, MD   Brief Narrative:  Eduardo Burch is a 76 y.o. malewith medical history significant of the Essential HTN, Prostate cancer s/p prostatectomy, ankylosing spondylitis, CIPD and Paget's disease. Patient presents with bilateral lower extremity weakness. Patient Unable to ambulate now. Patient has had extensive workup including MRI, PET scan, and EMG studies between here and [facility]. 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 Previously tried on IVIG. Other working differentials included paraneoplastic syndrome or chronic inflammatory polyradiculopathy. Neuorlogy consulted and evaluated and recommending Plasmapheresis (PLEX) for 5 days. PT evaluated and feel like patient is a candidate for CIR; Physiatrist evaluated and recommended CIR. Rehab Coordinator to meet with Patient and Family yesterday and wife toured [employer]. Will D/C patient to Rehab once medically stable. Patient's Platelet Count dropped to 23 today but likely was Spurious as when repeated Platelet Count was 415. Patient had no issues overnight but complained of a sore Left Ankle.   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 GM1 antibody this admission pending, Lyme Negative, and paraneoplastic antibody panel pending (sent to [facility]) -C/w Neurochecks q6h -Appreciate Neurology evaluation; Neuro Recommending Continuing to Monitor Pulmonary Function and O2  -Supplemental Hydration with NS at 75 mL/hr restarted yesterday and will D/C later this Evening  Dysphagia -SLP evaluated Recommending Soft Diet Thin Liquids  -MBS done and SLP Diet Recommendations of Regular Solids Thin Liquids -Recommending Medication administration as whole meds with puree  Essential Hypertension -C/w Metoprolol 25 mg po Daily and Lisinopril 10 mg po Daily -Added IV Hydralazine 10 mg q6hprn for SBP >180 or DBP >100 -Restarted IVF with NS at 75 mL/hr and will D/C later this evening  Hx of Prostate Cancer and Paget's Disease  -Follow up with PCP and Oncology as an outpatient;  -Recent PET Scan in June did not show Malignancy  -Paraneoplastic Antibody Panel Sent and pending  -Outpatient Neurologist would favor repeating Malignancy Evaluation and Neurology team ordering CT Chest/Abd/Pelvis to evaluate   Hypophosphatemia, improved -Phos Level now 3.7 -Continue to Monitor and Replete as Necessary -Repeat Phos Level in AM   Leukocytosis, improved -Suspect Reactive; WBC is 9.3 -Continue to Monitor for S/Sx of Infection; If worsens will Pan-Culture -Repeat CBC in AM   Abnormal LFT's, improved  -Improved, AST is now 21 and ALT is now 13 -Continue to Trend  Hyperbilirubinemia, improved -Mild and improved -Patients T Bili increased from 0.9 -> 1.4; Now is 0.6 -Continue to Monitor and Repeat in AM  Constipation, improved -Added Senna-Docusate 2 tab po BID, Miralax 17 g po BID, -Discontinued Docusate 100 mg po BID -C/w Bisacodyl 5 mg po Daily prn Moderate Constipation -Was not  Bisacodyl 10 mg suppository as patient had a BM   Spurious Drop in Platelets -Platelet  Count went from 426 -> 23 -Was discussing Case with Dr. Irene Limbo but repeat CBC ordered and while I was talking with Dr. Irene Limbo repeat CBC resulted -Repeated and was 415 yesterday; Repeat Platelet Count was 416 this AM. -Continue to Monitor and repeat CBC in AM  Ankle Soreness on Lateral Left Side -May have banged it against bed -No restriction in Motion; Minor pain on palpation -PT to Eval and Treat -If still bothering patient consider obtaining Ankle X-Ray  DVT prophylaxis: Enoxaparin 40 mg sq q24h Code Status: FULL CODE Family Communication: Discussed with Wife at bedside Disposition Plan: CIR when medically stable.   Consultants:   Neurology  CIR  Discussed Case with Hematology Dr. Irene Limbo    Procedures:  PLEX   Antimicrobials:  Anti-infectives    None     Subjective: Seen and examined this AM in Dialysis getting PLEX and stated he had a good night but was complaining of some ankle soreness. No CP or SOB. No other concerns at this time.    Objective: Vitals:   11/19/16 1030 11/19/16 1045 11/19/16 1100 11/19/16 1300  BP: (!) 164/79 (!) 172/84 (!) 164/80 (!) 175/96  Pulse: 87 78 88 84  Resp: 17 (!) 24 20 (!) 22  Temp: 98.4 F (36.9 C)  (!) 97.5 F (36.4 C) 98.3 F (36.8 C)  TempSrc: Oral  Oral Oral  SpO2:   100%   Weight:   72 kg (158 lb 11.7 oz)   Height:        Intake/Output Summary (Last 24 hours) at 11/19/16 1344 Last data filed at 11/19/16 1332  Gross per 24 hour  Intake          2518.75 ml  Output             1730 ml  Net           788.75 ml   Filed Weights   11/19/16 0400 11/19/16 0930 11/19/16 1100  Weight: 68.4 kg (150 lb 12.7 oz) 72 kg (158 lb 11.7 oz) 72 kg (158 lb 11.7 oz)   Examination: Physical Exam:  Constitutional: Pleasant Cacuasian male in NAD; States he had an ok night and only complaining of lateral ankle soreness.  Eyes: Sclerae anicteric; Conjunctivae non-injected ENMT: Grossly normal hearing. MMM Neck: Supple with no  JVD Respiratory: CTAB with unlabored breathing. No wheezing/rales/rhonchi Cardiovascular: RRR; S1; S2. No LE Edema Abdomen: Soft, NT, ND. Bowel sounds present GU: Deferred Musculoskeletal: No contractures. No cyanosis Skin: Warm and Dry. No rashes or lesions on a limited skin eval Neurologic: CN 2-12 grossly intact. No tremors appreciated.  Psychiatric: Pleasant mood and affect. Intact judgement and affect  Data Reviewed: I have personally reviewed following labs and imaging studies  CBC:  Recent Labs Lab 11/16/16 0250 11/17/16 0339 11/17/16 0832 11/18/16 0712 11/18/16 1046 11/19/16 0229 11/19/16 0927  WBC 10.6* 10.8*  --  7.6 9.0 9.3  --   NEUTROABS 7.0 7.2  --  4.8 6.5 5.9  --   HGB 14.4 14.1 13.6 13.4 12.9* 12.8* 11.6*  HCT 42.9 41.7 40.0 39.3 38.8* 37.7* 34.0*  MCV 95.3 95.9  --  95.4 96.5 95.7  --   PLT 394 426*  --  QUESTIONABLE RESULTS, RECOMMEND RECOLLECT TO VERIFY 415* 416*  --    Basic Metabolic Panel:  Recent Labs Lab 11/15/16 1002 11/16/16 0250 11/17/16 0339 11/17/16 0832 11/18/16 0712 11/19/16  0229 11/19/16 0927  NA 138 138 141 142 140 138 141  K 3.8 3.6 4.5 3.7 3.6 3.5 3.7  CL 108 112* 110 103 111 110 103  CO2 24 20* 28  --  23 23  --   GLUCOSE 108* 104* 102* 116* 103* 99 147*  BUN _0 21* _1 CREATININE 0.49* 0.48* 0.60* 0.60* 0.50* 0.46* 0.40*  CALCIUM 8.2* 8.3* 8.5*  --  8.3* 8.1*  --   MG 1.9 1.8 2.1  --  1.8 1.8  --   PHOS 2.5 3.3 3.7  --  3.7 3.4  --    GFR: Estimated Creatinine Clearance: 77.5 mL/min (A) (by C-G formula based on SCr of 0.4 mg/dL (L)). Liver Function Tests:  Recent Labs Lab 11/15/16 1002 11/16/16 0250 11/17/16 0339 11/18/16 0712 11/19/16 0229  AST _2 ALT 34 17 15* 13* 13*  ALKPHOS 38 22* 30* 21* 26*  BILITOT 0.9 0.9 1.4* 1.2 0.6  PROT 5.5* 4.9* 4.7* 4.4* 4.5*  ALBUMIN 3.5 3.9 3.4* 3.4* 3.2*   No results for input(s): LIPASE, AMYLASE in the last 168 hours. No results for input(s):  AMMONIA in the last 168 hours. Coagulation Profile:  Recent Labs Lab 11/13/16 1031  INR 0.97   Cardiac Enzymes: No results for input(s): CKTOTAL, CKMB, CKMBINDEX, TROPONINI in the last 168 hours. BNP (last 3 results) No results for input(s): PROBNP in the last 8760 hours. HbA1C: No results for input(s): HGBA1C in the last 72 hours. CBG: No results for input(s): GLUCAP in the last 168 hours. Lipid Profile: No results for input(s): CHOL, HDL, LDLCALC, TRIG, CHOLHDL, LDLDIRECT in the last 72 hours. Thyroid Function Tests: No results for input(s): TSH, T4TOTAL, FREET4, T3FREE, THYROIDAB in the last 72 hours. Anemia Panel: No results for input(s): VITAMINB12, FOLATE, FERRITIN, TIBC, IRON, RETICCTPCT in the last 72 hours. Sepsis Labs: No results for input(s): PROCALCITON, LATICACIDVEN in the last 168 hours.  No results found for this or any previous visit (from the past 240 hour(s)).   Radiology Studies: No results found. Scheduled Meds: . aspirin  81 mg Oral Daily  . enoxaparin (LOVENOX) injection  40 mg Subcutaneous Q24H  . heparin  1,000 Units Intracatheter Once  . lisinopril  10 mg Oral Daily  . metoprolol succinate  25 mg Oral Daily  . polyethylene glycol  17 g Oral BID  . senna-docusate  2 tablet Oral BID   Continuous Infusions: . sodium chloride 75 mL/hr at 11/19/16 0447  . citrate dextrose    . potassium PHOSPHATE IVPB (mmol)      LOS: 7 days   Kerney Elbe, DO [facility] Pager [phone]  If 7PM-7AM, please contact night-coverage [email or web address] Password [email or web address] 11/19/2016, 1:44 PM

## 2016-11-19 NOTE — Progress Notes (Signed)
Patient politely declines CT and drinking contrast today would like it rescheduled for tomorrow. Will continue to monitor patient. Fatimata Talsma, Bettina Gavia rN

## 2016-11-20 ENCOUNTER — Inpatient Hospital Stay (HOSPITAL_COMMUNITY): Payer: Medicare Other

## 2016-11-20 LAB — MAGNESIUM: MAGNESIUM: 1.7 mg/dL (ref 1.7–2.4)

## 2016-11-20 LAB — CBC WITH DIFFERENTIAL/PLATELET
BASOS ABS: 0 10*3/uL (ref 0.0–0.1)
Basophils Relative: 0 %
EOS PCT: 2 %
Eosinophils Absolute: 0.2 10*3/uL (ref 0.0–0.7)
HCT: 41.6 % (ref 39.0–52.0)
Hemoglobin: 14.1 g/dL (ref 13.0–17.0)
LYMPHS PCT: 21 %
Lymphs Abs: 2.4 10*3/uL (ref 0.7–4.0)
MCH: 32.2 pg (ref 26.0–34.0)
MCHC: 33.9 g/dL (ref 30.0–36.0)
MCV: 95 fL (ref 78.0–100.0)
Monocytes Absolute: 1 10*3/uL (ref 0.1–1.0)
Monocytes Relative: 8 %
NEUTROS PCT: 69 %
Neutro Abs: 8.1 10*3/uL — ABNORMAL HIGH (ref 1.7–7.7)
PLATELETS: 445 10*3/uL — AB (ref 150–400)
RBC: 4.38 MIL/uL (ref 4.22–5.81)
RDW: 14.6 % (ref 11.5–15.5)
WBC: 11.7 10*3/uL — AB (ref 4.0–10.5)

## 2016-11-20 LAB — COMPREHENSIVE METABOLIC PANEL
ALT: 15 U/L — ABNORMAL LOW (ref 17–63)
AST: 19 U/L (ref 15–41)
Albumin: 4.1 g/dL (ref 3.5–5.0)
Alkaline Phosphatase: 20 U/L — ABNORMAL LOW (ref 38–126)
Anion gap: 7 (ref 5–15)
BUN: 6 mg/dL (ref 6–20)
CALCIUM: 8.5 mg/dL — AB (ref 8.9–10.3)
CHLORIDE: 106 mmol/L (ref 101–111)
CO2: 26 mmol/L (ref 22–32)
CREATININE: 0.5 mg/dL — AB (ref 0.61–1.24)
GFR calc Af Amer: >60 mL/min (ref >60)
Glucose, Bld: 104 mg/dL — ABNORMAL HIGH (ref 65–99)
Potassium: 3.7 mmol/L (ref 3.5–5.1)
SODIUM: 139 mmol/L (ref 135–145)
TOTAL PROTEIN: 4.9 g/dL — AB (ref 6.5–8.1)
Total Bilirubin: 1.6 mg/dL — ABNORMAL HIGH (ref 0.3–1.2)

## 2016-11-20 LAB — PHOSPHORUS: PHOSPHORUS: 3.2 mg/dL (ref 2.5–4.6)

## 2016-11-20 MED ORDER — IOPAMIDOL (ISOVUE-300) INJECTION 61%
INTRAVENOUS | Status: AC
  Administered 2016-11-20: 12:00:00
  Filled 2016-11-20: qty 30

## 2016-11-20 MED ORDER — IOPAMIDOL (ISOVUE-300) INJECTION 61%
INTRAVENOUS | Status: AC
  Administered 2016-11-20: 100 mL
  Filled 2016-11-20: qty 100

## 2016-11-20 NOTE — Progress Notes (Signed)
I toured pt's wife our inpt rehab facility today. [phone]

## 2016-11-20 NOTE — Progress Notes (Signed)
Pt unable to drink grape flavored contrast. States it was making him feel sick. Exchanged with the CT department for an unflavored contrast. Pt agreeable to try to drink new contrast.  Fritz Pickerel, RN

## 2016-11-20 NOTE — Progress Notes (Addendum)
Occupational Therapy Treatment Patient Details Name: Eduardo Burch MRN: [id number] DOB: 07/23/1940 Today's Date: 11/20/2016    History of present illness Eduardo Burch is a 76 y.o. malewith medical history significant of the thecal HTN, prostate cancer s/p prostatectomy, ankylosing spondylitis, CIPD and Paget's disease(associated symptoms include difficulty swallowing, fevers, chills, night sweats, 50 pounds weight loss over last 4 months). Patient presents with bilateral lower extremity weakness resulting in a fall. 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     OT comments  Pt demonstrating progress toward OT goals this session. Pt with temporary R femoral HD catheter and limited session accordingly to bed level B UE strengthening exercises with level I Theraband to improve strength in preparation for ADL participation. Pt is demonstrating improving strength and activity tolerance. Pt is highly motivated to participate with therapies and reports that he is looking forward to progressing with mobility soon. Will continue to follow while admitted.    Follow Up Recommendations  CIR;Supervision/Assistance - 24 hour    Equipment Recommendations  Other (comment) (TBD at next venue)    Recommendations for Other Services Rehab consult    Precautions / Restrictions Precautions Precautions: Fall       Mobility Bed Mobility                  Transfers                      Balance                                           ADL either performed or assessed with clinical judgement   ADL                                         General ADL Comments: Session focused on bed level B UE strengthening exercises only as pt with temporary femoral HD catheter in R LE and R hip flexion is contraindicated. Pt fatigues easily with exercises and noted B UE tremoring as activity  progressed.      Vision       Perception     Praxis      Cognition Arousal/Alertness: Awake/alert Behavior During Therapy: Anxious Overall Cognitive Status: Within Functional Limits for tasks assessed                                 General Comments: Pt with fluctuating behavior from joking/laughing to becoming loud and speaking angrily toward OT.         Exercises Exercises: General Upper Extremity General Exercises - Upper Extremity Shoulder Flexion: Strengthening;Both;20 reps;Supine;Theraband Theraband Level (Shoulder Flexion): Level 1 (Yellow) Shoulder Horizontal ABduction: Strengthening;Both;Supine;Theraband;20 reps Theraband Level (Shoulder Horizontal Abduction): Level 1 (Yellow) Elbow Flexion: Strengthening;Both Other Exercises Other Exercises: Tricep presses posteriorly into bed; 10 repetitions each   Shoulder Instructions       General Comments      Pertinent Vitals/ Pain       Pain Assessment: No/denies pain  Home Living  Prior Functioning/Environment              Frequency  Min 3X/week        Progress Toward Goals  OT Goals(current goals can now be found in the care plan section)  Progress towards OT goals: Progressing toward goals  Acute Rehab OT Goals Patient Stated Goal: get back to normal OT Goal Formulation: With patient Time For Goal Achievement: 11/28/16 Potential to Achieve Goals: Good  Plan Discharge plan remains appropriate    Co-evaluation                 AM-PAC PT "6 Clicks" Daily Activity     Outcome Measure   Help from another person eating meals?: None Help from another person taking care of personal grooming?: A Little Help from another person toileting, which includes using toliet, bedpan, or urinal?: A Lot Help from another person bathing (including washing, rinsing, drying)?: A Little Help from another person to put on and taking  off regular upper body clothing?: A Little Help from another person to put on and taking off regular lower body clothing?: A Lot 6 Click Score: 17    End of Session    OT Visit Diagnosis: Unsteadiness on feet (R26.81);Other abnormalities of gait and mobility (R26.89);Muscle weakness (generalized) (M62.81);Other symptoms and signs involving the nervous system (R29.898)   Activity Tolerance     Patient Left     Nurse Communication          Time: 0981-1914 OT Time Calculation (min): 11 min  Charges: OT General Charges $OT Visit: 1 Visit OT Treatments $Therapeutic Exercise: 8-22 mins  Norman Herrlich, MS OTR/L  Pager: [place] O[place] Starn 11/20/2016, 7:03 PM

## 2016-11-20 NOTE — Progress Notes (Signed)
PT Cancellation Note  Patient Details Name: Eduardo Burch MRN: [id number] DOB: May 06, 1940   Cancelled Treatment:    Reason Eval/Treat Not Completed: Patient declined, no reason specified Pt is currently trying to finish drinking contrast for CT scheduled this pm and declined therex at this time. PT will check on pt later as time allows.    [place] April, PTA Pager: [phone]   11/20/2016, 12:17 PM

## 2016-11-20 NOTE — Progress Notes (Addendum)
Neurology Progress Note   S:// Patient was seen and examined. No new complaints. Was unable to get CT with contrast of chest abdomen and pelvis yesterday because he was too tired after plasma exchange and refused. We will be getting CT with contrast AND PELVIS TODAY Will be getting final round (#5) of plasma exchange tomorrow.  O:// Current vital signs: BP (!) 150/91 (BP Location: Left Arm)   Pulse 100   Temp 97.7 F (36.5 C) (Oral)   Resp (!) 26   Ht 6' (1.829 m)   Wt 66.3 kg (146 lb 2.6 oz)   SpO2 100%   BMI 19.82 kg/m   Vital signs in last 24 hours: Temp:  [97.7 F (36.5 C)-98.1 F (36.7 C)] 97.7 F (36.5 C) (09/18 1253) Pulse Rate:  [88-100] 100 (09/18 1300) Resp:  [18-28] 26 (09/18 1300) BP: (150-178)/(84-96) 150/91 (09/18 1253) SpO2:  [99 %-100 %] 100 % (09/18 1300) Weight:  [66.3 kg (146 lb 2.6 oz)] 66.3 kg (146 lb 2.6 oz) (09/18 0423) Mental status: Awake alert oriented 3. Speech clear. Naming, compression and repetition intact. Cranial nerves: 2-12 intact Motor:bilateral upper extremity 4/5. Right lower extremity: 2-3/5 hip flexor, 3/5 hip extension, 3/5 knee extension, 4/5 knee flexion, 5/5 plantar and dorsiflexion. Left lower extremity: 2-3/5 hip flexion, 2-3/5 hip extension, 3/5 knee extension, 4/5 knee flexion, 5/5 plantar and dorsiflexion. Sensory exam: Intact to light touch all over. He reports some tingling on the right thigh but not dysesthesia or allodynia. Completely areflexic Action tremor present bilaterally, no dysmetria. Unable to testgait  Meds  Current Facility-Administered Medications:  .  acetaminophen (TYLENOL) tablet 650 mg, 650 mg, Oral, Q4H PRN, 650 mg at 11/19/16 0441 **OR** acetaminophen (TYLENOL) solution 650 mg, 650 mg, Per Tube, Q4H PRN **OR** acetaminophen (TYLENOL) suppository 650 mg, 650 mg, Rectal, Q4H PRN, Smith, Rondell A, MD .  acetaminophen (TYLENOL) tablet 650 mg, 650 mg, Oral, Q4H PRN, Greta Doom, MD, 650 mg at  11/19/16 0917 .  aspirin chewable tablet 81 mg, 81 mg, Oral, Daily, Tamala Julian, Rondell A, MD, 81 mg at 11/20/16 0926 .  bisacodyl (DULCOLAX) EC tablet 5 mg, 5 mg, Oral, Daily PRN, Blount, Xenia T, NP, 5 mg at 11/17/16 1157 .  citrate dextrose (ACD-A anticoagulant) solution 500 mL, 500 mL, Intravenous, Continuous, Greta Doom, MD, 500 mL at 11/19/16 0917 .  diphenhydrAMINE (BENADRYL) capsule 25 mg, 25 mg, Oral, Q6H PRN, Greta Doom, MD, 25 mg at 11/19/16 0916 .  enoxaparin (LOVENOX) injection 40 mg, 40 mg, Subcutaneous, Q24H, Smith, Rondell A, MD, Stopped at 11/18/16 1000 .  heparin injection 1,000 Units, 1,000 Units, Intracatheter, Once, Greta Doom, MD, Stopped at 11/19/16 1223 .  hydrALAZINE (APRESOLINE) injection 10 mg, 10 mg, Intravenous, Q6H PRN, Sheikh, Omair Latif, DO .  lisinopril (PRINIVIL,ZESTRIL) tablet 10 mg, 10 mg, Oral, Daily, Tamala Julian, Rondell A, MD, 10 mg at 11/20/16 0925 .  metoprolol succinate (TOPROL-XL) 24 hr tablet 25 mg, 25 mg, Oral, Daily, Tamala Julian, Rondell A, MD, 25 mg at 11/20/16 0925 .  polyethylene glycol (MIRALAX / GLYCOLAX) packet 17 g, 17 g, Oral, BID, Raiford Noble [place], DO, 17 g at 11/20/16 1062 .  potassium PHOSPHATE 20 mmol in dextrose 5 % 500 mL infusion, 20 mmol, Intravenous, Once, Sheikh, Omair Latif, DO .  senna-docusate (Senokot-S) tablet 2 tablet, 2 tablet, Oral, BID, Raiford Noble [place], [place], 2 tablet at 11/18/16 2133 .  tiZANidine (ZANAFLEX) tablet 2 mg, 2 mg, Oral, Q8H PRN, Tamala Julian, Rondell A,  MD, 2 mg at 11/19/16 0504 .  traMADol (ULTRAM) tablet 50 mg, 50 mg, Oral, Q6H PRN, Fuller Plan A, MD, 50 mg at 11/19/16 1330 Labs CBC    Component Value Date/Time   WBC 11.7 (H) 11/20/2016 0311   RBC 4.38 11/20/2016 0311   HGB 14.1 11/20/2016 0311   HCT 41.6 11/20/2016 0311   PLT 445 (H) 11/20/2016 0311   MCV 95.0 11/20/2016 0311   MCH 32.2 11/20/2016 0311   MCHC 33.9 11/20/2016 0311   RDW 14.6 11/20/2016 0311   LYMPHSABS 2.4  11/20/2016 0311   MONOABS 1.0 11/20/2016 0311   EOSABS 0.2 11/20/2016 0311   BASOSABS 0.0 11/20/2016 0311    CMP     Component Value Date/Time   NA 139 11/20/2016 0311   NA 139 08/16/2016 1106   K 3.7 11/20/2016 0311   CL 106 11/20/2016 0311   CO2 26 11/20/2016 0311   GLUCOSE 104 (H) 11/20/2016 0311   BUN 6 11/20/2016 0311   BUN 20 08/16/2016 1106   CREATININE 0.50 (L) 11/20/2016 0311   CALCIUM 8.5 (L) 11/20/2016 0311   PROT 4.9 (L) 11/20/2016 0311   PROT 7.4 08/16/2016 1106   ALBUMIN 4.1 11/20/2016 0311   ALBUMIN 3.5 09/28/2016 1648   AST 19 11/20/2016 0311   ALT 15 (L) 11/20/2016 0311   ALKPHOS 20 (L) 11/20/2016 0311   BILITOT 1.6 (H) 11/20/2016 0311   BILITOT 0.7 08/16/2016 1106   GFRNONAA >60 11/20/2016 0311   GFRAA >60 11/20/2016 0311    NCS/EMG of the right arm and leg 10/08/2016:  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  No evidence of large fiber polyneuropathy of myopathy. [personally reviewed, only right sural and ulnar sensory responses were checked which were normal; motor - R ulnar, R tibial, R peroneal is normal; bilateral tibial H is absent.  Needle of genioglossus, deltoid, FDI is normal; R TP, TA, VL with active denervation; lumbar PSP normal]  Assessment:  76 year old man with worsening/progression of his CIDP. He was seen last month, an LP was performed that showed albumin low cytological dissociation. He received 5 days of IVIG with minimal improvement in his symptoms. 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 He was also evaluated by Dr. Posey Pronto at the [facility] neurology, who recommends thatsome additional labs be checked, which have been ordered including paraneoplastic panel.  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 Evaluate for underlying malignancy since he also has consitutional symptoms (wt loss) and a h/o prostate ca.  Recommendations: -Complete last round of plasma exchange tomorrow -Continue with PT/OT once the femoral catheter is out -CT of the chest abdomen and pelvis with contrast to evaluate for malignancy (due to the presence of constitutional symptoms, which CIDP does not usually present with - raising suspicion for a paraneoplastic process) -Can consider sending paraneoplastic panel as outpatient -If he shows no improvement with plasma exchange, I would still recommend that he be discharged to rehab and then follow with Dr. Posey Pronto in the next 2 weeks for decision on immunosuppression - consider Monthly IVIG or IV rituximab. I had a detailed conversation with Dr. Posey Pronto regarding this plan and have formulated this plan with my conversations with her in mind.  -- Amie Portland, MD [facility] [phone]  If 7pm to 7am,  please call on call as listed on AMION.

## 2016-11-20 NOTE — Progress Notes (Signed)
PROGRESS NOTE    Eduardo Burch  QMV:[id number] DOB: [birth year] DOA: 11/12/2016 PCP: Lavone Orn, MD   Brief Narrative:  Eduardo Burch is a 76 y.o. malewith medical history significant of the Essential HTN, Prostate cancer s/p prostatectomy, ankylosing spondylitis, CIPD and Paget's disease. Patient presents with bilateral lower extremity weakness. Patient Unable to ambulate now. Patient has had extensive workup including MRI, PET scan, and EMG studies between here and [facility]. 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 Previously tried on IVIG. Other working differentials included paraneoplastic syndrome or chronic inflammatory polyradiculopathy. Neuorlogy consulted and evaluated and recommending Plasmapheresis (PLEX) for 5 days. PT evaluated and feel like patient is a candidate for CIR; Physiatrist evaluated and recommended CIR however patient is unable to progress out of bed with therapy due to Femoral Line for PLEX. Rehab Coordinator to meet with Patient and Family yesterday and wife toured [employer]. Will D/C patient to Rehab once medically stable.  Patient's Platelet Count dropped to 23 on 11/18/16 but likely was Spurious as when repeated Platelet Count was 415. Patient had no issues but complained of a sore Left Ankle on 9/17 which has now resolved. This morning he had no complaints except that he was not happy with getting CT Chest/Abd/Pelvis as he feels like he has had "plenty of imaging studies" in the last few months.   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 GM1 antibody this admission pending, Lyme Negative, and paraneoplastic antibody panel pending (sent to [facility]) -C/w Neurochecks q6h -Appreciate Neurology evaluation; Neuro Recommending Continuing to Monitor Pulmonary Function and O2  -Supplemental Hydration with NS at 75 mL/hr now discontinued -PT/OT once patient is able to progress out of bed with therapy due to his femoral line for PLEX  Dysphagia -SLP evaluated Recommending Soft Diet Thin Liquids  -MBS done and SLP Diet Recommendations of Regular Solids Thin Liquids -Recommending Medication administration as whole meds with puree  Essential Hypertension -C/w Metoprolol 25 mg po Daily and Lisinopril 10 mg po Daily -Added IV Hydralazine 10 mg q6hprn for SBP >180 or DBP >100 -IVF now D/C'd  Hx of Prostate Cancer and Paget's Disease  -Follows up with PCP and Oncology as an outpatient; Has lost 50 lbs over the last 4 months   -Paraneoplastic Antibody Panel Sent and pending  -Outpatient Neurologist would favor repeating Malignancy Evaluation and Neurology team ordering CT Chest/Abd/Pelvis to evaluate -CT Chest/Abd/Pelvis with Contrast pending as patient still to drink Contrast -Recent NM Bone Scan on 08/23/16 showed Asymmetric uptake within the posterior left pelvis, L5, and S1 compatible with Paget's disease. The CT appearance is less consistent with metastatic disease. Anterior superior endplate fracture at L2 demonstrates uptake. Other degenerative uptake is present without evidence for metastatic disease. -  Recent MRI of Thoracic Spine 10/02/16 showed normal spinal cord signal. No  enhancement and showed Multiple scattered hemangiomas throughout the thoracic spine. Two lesions in the T6 and T11 vertebral bodies demonstrate hypointensity on T1, and are favored to represent atypical hemangiomas over osseous metastatic disease given recent negative bone scan. Mild chronic anterior wedging of the T7 through T9 vertebral bodies. -MRI of Brain w/o Contrast on 10/26/16 at [facility] showed no evidence of intracranial metastatic disease -MRI of Cervical Spine w/wo Contrast 10/26/16 showed Heterogeneous marrow signal without evidence of focal cervical spine osseous lesion. A benign osseous hemangioma is noted in the T2 vertebral body. -Will Check PSA level    Hypophosphatemia, improved -Phos Level now 3.2 -Continue to Monitor and Replete as Necessary -Repeat Phos Level in AM   Leukocytosis -Suspect Reactive; WBC went from 9.3 -> 11.7 -Continue to Monitor for S/Sx of Infection; If worsens will Pan-Culture -Repeat CBC in AM   Abnormal LFT's, improved  -Improved, AST is now 19 and ALT is now 15 -Continue to Trend  Hyperbilirubinemia -Mild  -Patients T Bili went from 0.6 -> 1.6 -Continue to Monitor and Repeat in AM  Constipation, improved -Added Senna-Docusate 2 tab po BID, Miralax 17 g po BID, -Discontinued Docusate 100 mg po BID -C/w Bisacodyl 5 mg po Daily prn Moderate Constipation -Was not given Bisacodyl 10 mg suppository as patient had a BM   Spurious Drop in Platelets -Platelet Count went from 426 -> 23 -Was discussing Case with Dr. Irene Limbo but repeat CBC ordered and while I was talking with Dr. Irene Limbo repeat CBC resulted -Repeated and was 415 11/18/16; Repeat Platelet Count was 445 this AM. -Continue to Monitor and repeat CBC in AM  Ankle Soreness on Lateral Left Side, improved -May have banged it against bed -No restriction in Motion; Minor pain on palpation -PT to Eval and Treat once patient can progress out of bed with therapy due to Femoral Line with PLEX -If still  bothering patient consider obtaining Ankle X-Ray  DVT prophylaxis: Enoxaparin 40 mg sq q24h Code Status: FULL CODE Family Communication: Discussed with Wife at bedside Disposition Plan: CIR when medically stable.   Consultants:   Neurology  CIR  Discussed Case with Hematology Dr. Irene Limbo    Procedures:  PLEX   Antimicrobials:  Anti-infectives    None     Subjective: Seen and examined this AM and states he had an ok night. No CP or SOB. Unhappy about going for CT Scan but explained to him that it was to rule out malignancy. No other complaints and was hoping to see his dog later this afternoon.     Objective: Vitals:   11/19/16 2358 11/20/16 0000 11/20/16 0423 11/20/16 0929  BP:  (!) 158/84  (!) 157/96  Pulse:  88  95  Resp:  (!) 22  (!) 24  Temp: 97.7 F (36.5 C)  97.7 F (36.5 C) 97.7 F (36.5 C)  TempSrc: Oral  Oral Oral  SpO2:  100%  99%  Weight:   66.3 kg (146 lb 2.6 oz)   Height:        Intake/Output Summary (Last 24 hours) at 11/20/16 1216 Last data filed at 11/20/16 1014  Gross per 24 hour  Intake             3440 ml  Output             3755 ml  Net             -  315 ml   Filed Weights   11/19/16 0930 11/19/16 1100 11/20/16 0423  Weight: 72 kg (158 lb 11.7 oz) 72 kg (158 lb 11.7 oz) 66.3 kg (146 lb 2.6 oz)   Examination: Physical Exam:  Constitutional: WN/WD Caucasian Male in NAD and appears calm in bed Eyes: Sclerae anicteric; Lids normal ENMT: Grossly normal hearing. MMM. External Ears and nose appear normal Neck: Supple with no JVD Respiratory: CTAB; No wheezing/rales/rhonchi. Unlabored breathing Cardiovascular: RRR; S1 S2; No LE edema Abdomen: Soft, NT, ND. Bowel sounds present.  GU: Deferred Musculoskeletal: No contractures; No cyanosis Skin: Warm and dry. No rashes or lesions on a limited skin eval Neurologic: CN 2-12 grossly intact. No tremors noted today but LE severely weak Psychiatric: Normal mood and affect. Intact judgement and  insight awake and oriented x3.  Data Reviewed: I have personally reviewed following labs and imaging studies  CBC:  Recent Labs Lab 11/17/16 0339  11/18/16 0712 11/18/16 1046 11/19/16 0229 11/19/16 0927 11/20/16 0311  WBC 10.8*  --  7.6 9.0 9.3  --  11.7*  NEUTROABS 7.2  --  4.8 6.5 5.9  --  8.1*  HGB 14.1  < > 13.4 12.9* 12.8* 11.6* 14.1  HCT 41.7  < > 39.3 38.8* 37.7* 34.0* 41.6  MCV 95.9  --  95.4 96.5 95.7  --  95.0  PLT 426*  --  QUESTIONABLE RESULTS, RECOMMEND RECOLLECT TO VERIFY 415* 416*  --  445*  < > = values in this interval not displayed. Basic Metabolic Panel:  Recent Labs Lab 11/16/16 0250 11/17/16 0339 11/17/16 0832 11/18/16 0712 11/19/16 0229 11/19/16 0927 11/20/16 0311  NA 138 141 142 140 138 141 139  K 3.6 4.5 3.7 3.6 3.5 3.7 3.7  CL 112* 110 103 111 110 103 106  CO2 20* 28  --  23 23  --  26  GLUCOSE 104* 102* 116* 103* 99 147* 104*  BUN 11 18 21* '17 16 13 6  ' CREATININE 0.48* 0.60* 0.60* 0.50* 0.46* 0.40* 0.50*  CALCIUM 8.3* 8.5*  --  8.3* 8.1*  --  8.5*  MG 1.8 2.1  --  1.8 1.8  --  1.7  PHOS 3.3 3.7  --  3.7 3.4  --  3.2   GFR: Estimated Creatinine Clearance: 71.4 mL/min (A) (by C-G formula based on SCr of 0.5 mg/dL (L)). Liver Function Tests:  Recent Labs Lab 11/16/16 0250 11/17/16 0339 11/18/16 0712 11/19/16 0229 11/20/16 0311  AST '18 29 19 21 19  ' ALT 17 15* 13* 13* 15*  ALKPHOS 22* 30* 21* 26* 20*  BILITOT 0.9 1.4* 1.2 0.6 1.6*  PROT 4.9* 4.7* 4.4* 4.5* 4.9*  ALBUMIN 3.9 3.4* 3.4* 3.2* 4.1   No results for input(s): LIPASE, AMYLASE in the last 168 hours. No results for input(s): AMMONIA in the last 168 hours. Coagulation Profile: No results for input(s): INR, PROTIME in the last 168 hours. Cardiac Enzymes: No results for input(s): CKTOTAL, CKMB, CKMBINDEX, TROPONINI in the last 168 hours. BNP (last 3 results) No results for input(s): PROBNP in the last 8760 hours. HbA1C: No results for input(s): HGBA1C in the last 72  hours. CBG: No results for input(s): GLUCAP in the last 168 hours. Lipid Profile: No results for input(s): CHOL, HDL, LDLCALC, TRIG, CHOLHDL, LDLDIRECT in the last 72 hours. Thyroid Function Tests: No results for input(s): TSH, T4TOTAL, FREET4, T3FREE, THYROIDAB in the last 72 hours. Anemia Panel: No results for input(s): VITAMINB12, FOLATE, FERRITIN, TIBC, IRON, RETICCTPCT in  the last 72 hours. Sepsis Labs: No results for input(s): PROCALCITON, LATICACIDVEN in the last 168 hours.  No results found for this or any previous visit (from the past 240 hour(s)).   Radiology Studies: No results found. Scheduled Meds: . aspirin  81 mg Oral Daily  . enoxaparin (LOVENOX) injection  40 mg Subcutaneous Q24H  . heparin  1,000 Units Intracatheter Once  . lisinopril  10 mg Oral Daily  . metoprolol succinate  25 mg Oral Daily  . polyethylene glycol  17 g Oral BID  . senna-docusate  2 tablet Oral BID   Continuous Infusions: . citrate dextrose    . potassium PHOSPHATE IVPB (mmol)      LOS: 8 days   Kerney Elbe, DO [facility] Pager [phone]  If 7PM-7AM, please contact night-coverage [email or web address] Password [email or web address] 11/20/2016, 12:16 PM

## 2016-11-21 LAB — PSA, TOTAL AND FREE
PSA, Free Pct: UNDETERMINED %
Prostate Specific Ag, Serum: <0.1 ng/mL (ref 0.0–4.0)

## 2016-11-21 LAB — COMPREHENSIVE METABOLIC PANEL
ALBUMIN: 3.6 g/dL (ref 3.5–5.0)
ALK PHOS: 31 U/L — AB (ref 38–126)
ALT: 18 U/L (ref 17–63)
AST: 22 U/L (ref 15–41)
Anion gap: 7 (ref 5–15)
BUN: 11 mg/dL (ref 6–20)
CALCIUM: 8.6 mg/dL — AB (ref 8.9–10.3)
CO2: 31 mmol/L (ref 22–32)
CREATININE: 0.6 mg/dL — AB (ref 0.61–1.24)
Chloride: 102 mmol/L (ref 101–111)
GFR calc non Af Amer: >60 mL/min (ref >60)
GLUCOSE: 131 mg/dL — AB (ref 65–99)
Potassium: 4.5 mmol/L (ref 3.5–5.1)
SODIUM: 140 mmol/L (ref 135–145)
Total Bilirubin: 0.6 mg/dL (ref 0.3–1.2)
Total Protein: 4.8 g/dL — ABNORMAL LOW (ref 6.5–8.1)

## 2016-11-21 LAB — MAGNESIUM: Magnesium: 2 mg/dL (ref 1.7–2.4)

## 2016-11-21 LAB — CBC WITH DIFFERENTIAL/PLATELET
Basophils Absolute: 0 10*3/uL (ref 0.0–0.1)
Basophils Relative: 0 %
EOS ABS: 0.2 10*3/uL (ref 0.0–0.7)
Eosinophils Relative: 2 %
HCT: 43.2 % (ref 39.0–52.0)
HEMOGLOBIN: 14.8 g/dL (ref 13.0–17.0)
LYMPHS ABS: 2.7 10*3/uL (ref 0.7–4.0)
Lymphocytes Relative: 23 %
MCH: 32.6 pg (ref 26.0–34.0)
MCHC: 34.3 g/dL (ref 30.0–36.0)
MCV: 95.2 fL (ref 78.0–100.0)
Monocytes Absolute: 0.9 10*3/uL (ref 0.1–1.0)
Monocytes Relative: 8 %
NEUTROS PCT: 67 %
Neutro Abs: 7.9 10*3/uL — ABNORMAL HIGH (ref 1.7–7.7)
Platelets: 528 10*3/uL — ABNORMAL HIGH (ref 150–400)
RBC: 4.54 MIL/uL (ref 4.22–5.81)
RDW: 15 % (ref 11.5–15.5)
WBC: 11.6 10*3/uL — AB (ref 4.0–10.5)

## 2016-11-21 LAB — PHOSPHORUS: Phosphorus: 3.8 mg/dL (ref 2.5–4.6)

## 2016-11-21 MED ORDER — HEPARIN SODIUM (PORCINE) 1000 UNIT/ML IJ SOLN
1000.0000 [IU] | Freq: Once | INTRAMUSCULAR | Status: DC
  Filled 2016-11-21: qty 1

## 2016-11-21 MED ORDER — ACD FORMULA A 0.73-2.45-2.2 GM/100ML VI SOLN
Status: AC
  Administered 2016-11-21: 17:00:00
  Filled 2016-11-21: qty 500

## 2016-11-21 MED ORDER — SODIUM CHLORIDE 0.9 % IV SOLN
Freq: Once | INTRAVENOUS | Status: AC
  Administered 2016-11-21: 17:00:00 via INTRAVENOUS_CENTRAL
  Filled 2016-11-21: qty 200

## 2016-11-21 MED ORDER — ACETAMINOPHEN 325 MG PO TABS
ORAL_TABLET | ORAL | Status: AC
  Filled 2016-11-21: qty 2

## 2016-11-21 MED ORDER — ACD FORMULA A 0.73-2.45-2.2 GM/100ML VI SOLN
500.0000 mL | Status: DC
  Administered 2016-11-21: 500 mL via INTRAVENOUS
  Filled 2016-11-21: qty 500

## 2016-11-21 MED ORDER — SODIUM CHLORIDE 0.9 % IV SOLN
Freq: Once | INTRAVENOUS | Status: AC
  Administered 2016-11-21: 18:00:00 via INTRAVENOUS_CENTRAL
  Filled 2016-11-21: qty 200

## 2016-11-21 MED ORDER — ACETAMINOPHEN 325 MG PO TABS
650.0000 mg | ORAL_TABLET | ORAL | Status: DC | PRN
  Administered 2016-11-21: 650 mg via ORAL

## 2016-11-21 MED ORDER — CALCIUM CARBONATE ANTACID 500 MG PO CHEW
CHEWABLE_TABLET | ORAL | Status: AC
  Filled 2016-11-21: qty 2

## 2016-11-21 MED ORDER — DIPHENHYDRAMINE HCL 25 MG PO CAPS: 25.0000 mg | ORAL_CAPSULE | Freq: Four times a day (QID) | ORAL | Status: DC | PRN

## 2016-11-21 MED ORDER — CALCIUM CARBONATE ANTACID 500 MG PO CHEW
2.0000 | CHEWABLE_TABLET | ORAL | Status: AC
  Administered 2016-11-21: 400 mg via ORAL

## 2016-11-21 MED ORDER — DIPHENHYDRAMINE HCL 25 MG PO CAPS
ORAL_CAPSULE | ORAL | Status: AC
  Filled 2016-11-21: qty 1

## 2016-11-21 MED ORDER — SODIUM CHLORIDE 0.9 % IV SOLN
2.0000 g | Freq: Once | INTRAVENOUS | Status: AC
  Administered 2016-11-21: 2 g via INTRAVENOUS
  Filled 2016-11-21 (×2): qty 20

## 2016-11-21 NOTE — Progress Notes (Signed)
Neurology Progress Note   S:// Seen and examined. Reports no major improvement. No pain in the back or thighs as he had reported on Monday. He is concerned about plan for rehabilitation and is getting conflicting reports about the plan from multiple professionals in the hospital, and would like to have a consolidated plan communicated to him.  O:// Current vital signs: BP 114/67 (BP Location: Right Arm)   Pulse (!) 110   Temp 97.6 F (36.4 C) (Oral)   Resp (!) 23   Ht 6' (1.829 m)   Wt 67.2 kg (148 lb 2.4 oz)   SpO2 98%   BMI 20.09 kg/m  Vital signs in last 24 hours: Temp:  [97.6 F (36.4 C)-97.7 F (36.5 C)] 97.6 F (36.4 C) (09/19 0400) Pulse Rate:  [86-110] 110 (09/19 0813) Resp:  [18-28] 23 (09/19 0813) BP: (114-150)/(58-107) 114/67 (09/19 0813) SpO2:  [97 %-100 %] 98 % (09/19 0813) Weight:  [67.2 kg (148 lb 2.4 oz)] 67.2 kg (148 lb 2.4 oz) (09/19 0500) Mental status: Awake alert oriented 3. Speech clear. Naming, compression and repetition intact. Cranial nerves: 2-12 intact Motor:bilateral upper extremity 4/5. Right lower extremity: 3/5 hip flexor, 3/5 hip extension, 3/5 knee extension, 4/5 knee flexion, 5/5 plantar and dorsiflexion. Left lower extremity: 3/5 hip flexion, 2-3/5 hip extension, 3/5 knee extension, 4/5 knee flexion, 5/5 plantar and dorsiflexion. Sensory exam: Intact to light touch all over. No dysesthesia or allodynia. Completely areflexic Action tremor present bilaterally, no dysmetria. Unable to testgait  MEDS  Current Facility-Administered Medications:  .  acetaminophen (TYLENOL) tablet 650 mg, 650 mg, Oral, Q4H PRN, 650 mg at 11/19/16 0441 **OR** acetaminophen (TYLENOL) solution 650 mg, 650 mg, Per Tube, Q4H PRN **OR** acetaminophen (TYLENOL) suppository 650 mg, 650 mg, Rectal, Q4H PRN, Smith, Rondell A, MD .  acetaminophen (TYLENOL) tablet 650 mg, 650 mg, Oral, Q4H PRN, Greta Doom, MD, 650 mg at 11/19/16 0917 .  aspirin chewable  tablet 81 mg, 81 mg, Oral, Daily, Tamala Julian, Rondell A, MD, 81 mg at 11/20/16 0926 .  bisacodyl (DULCOLAX) EC tablet 5 mg, 5 mg, Oral, Daily PRN, Blount, Xenia T, NP, 5 mg at 11/17/16 1157 .  citrate dextrose (ACD-A anticoagulant) solution 500 mL, 500 mL, Intravenous, Continuous, Greta Doom, MD, 500 mL at 11/19/16 0917 .  diphenhydrAMINE (BENADRYL) capsule 25 mg, 25 mg, Oral, Q6H PRN, Greta Doom, MD, 25 mg at 11/19/16 0916 .  enoxaparin (LOVENOX) injection 40 mg, 40 mg, Subcutaneous, Q24H, Smith, Rondell A, MD, Stopped at 11/18/16 1000 .  heparin injection 1,000 Units, 1,000 Units, Intracatheter, Once, Greta Doom, MD, Stopped at 11/19/16 1223 .  hydrALAZINE (APRESOLINE) injection 10 mg, 10 mg, Intravenous, Q6H PRN, Sheikh, Omair Latif, DO .  lisinopril (PRINIVIL,ZESTRIL) tablet 10 mg, 10 mg, Oral, Daily, Tamala Julian, Rondell A, MD, 10 mg at 11/20/16 0925 .  metoprolol succinate (TOPROL-XL) 24 hr tablet 25 mg, 25 mg, Oral, Daily, Tamala Julian, Rondell A, MD, 25 mg at 11/20/16 0925 .  polyethylene glycol (MIRALAX / GLYCOLAX) packet 17 g, 17 g, Oral, BID, Raiford Noble [place], DO, 17 g at 11/20/16 5732 .  potassium PHOSPHATE 20 mmol in dextrose 5 % 500 mL infusion, 20 mmol, Intravenous, Once, Sheikh, Omair Latif, DO .  senna-docusate (Senokot-S) tablet 2 tablet, 2 tablet, Oral, BID, Raiford Noble [place], [place], 2 tablet at 11/18/16 2133 .  tiZANidine (ZANAFLEX) tablet 2 mg, 2 mg, Oral, Q8H PRN, Fuller Plan A, MD, 2 mg at 11/19/16 0504 .  traMADol (  ULTRAM) tablet 50 mg, 50 mg, Oral, Q6H PRN, Fuller Plan A, MD, 50 mg at 11/19/16 1330 Labs CBC    Component Value Date/Time   WBC 11.6 (H) 11/21/2016 0238   RBC 4.54 11/21/2016 0238   HGB 14.8 11/21/2016 0238   HCT 43.2 11/21/2016 0238   PLT 528 (H) 11/21/2016 0238   MCV 95.2 11/21/2016 0238   MCH 32.6 11/21/2016 0238   MCHC 34.3 11/21/2016 0238   RDW 15.0 11/21/2016 0238   LYMPHSABS 2.7 11/21/2016 0238   MONOABS 0.9 11/21/2016  0238   EOSABS 0.2 11/21/2016 0238   BASOSABS 0.0 11/21/2016 0238    CMP     Component Value Date/Time   NA 140 11/21/2016 0238   NA 139 08/16/2016 1106   K 4.5 11/21/2016 0238   CL 102 11/21/2016 0238   CO2 31 11/21/2016 0238   GLUCOSE 131 (H) 11/21/2016 0238   BUN 11 11/21/2016 0238   BUN 20 08/16/2016 1106   CREATININE 0.60 (L) 11/21/2016 0238   CALCIUM 8.6 (L) 11/21/2016 0238   PROT 4.8 (L) 11/21/2016 0238   PROT 7.4 08/16/2016 1106   ALBUMIN 3.6 11/21/2016 0238   ALBUMIN 3.5 09/28/2016 1648   AST 22 11/21/2016 0238   ALT 18 11/21/2016 0238   ALKPHOS 31 (L) 11/21/2016 0238   BILITOT 0.6 11/21/2016 0238   BILITOT 0.7 08/16/2016 1106   GFRNONAA >60 11/21/2016 0238   GFRAA >60 11/21/2016 0238   NCS/EMG of the right arm and leg 10/08/2016: 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 No evidence of large fiber polyneuropathy of myopathy. [personally reviewed, only right sural and ulnar sensory responses were checked which were normal; motor - R ulnar, R tibial, R peroneal is normal; bilateral tibial H is absent. Needle of genioglossus, deltoid, FDI is normal; R TP, TA, VL with active denervation; lumbar PSP normal]  Assessment:  76 year old man with worsening/progression of his CIDP. He was seen last month, an LP was performed that showed albumino-cytological dissociation. He received 5 days of IVIG with minimal improvement in his symptoms. He had worsening of hip strength and had falls, for which he was sent to ER for emergent assessment and PLEX per outpatient neurologist. 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 He has established outpatient care with Dr. Posey Pronto at [facility] neurology.. Paraneoplastic panel is pending. CT with contrast of the torso unremarkable for active malignancy or mets. Incidental findings of colonic thickening and abnormal  aortic diameters were discussed with the patient. He will follow up with primary care on this.  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 Evaluate for underlying malignancy since he also has consitutional symptoms (wt loss) and a h/o prostate ca.  Recommendations: -Complete last round of plasma exchange today -Continue with PT/OT once the femoral catheter is out - patient with like to have a plan for rehabilitation clearly communicated to him. He is extremely frustrated with not having clarity on the discharge plan. I told him that his physical and occupational therapy were limited because of the catheter and he will have to wait for the assessment by PT and OT. I would appreciate the primary team's help with ensuring that his and his wife's questions about discharge and rehabilitation are answered.  -I would recommend discharge to rehabilitation after his plasma  exchanges completed. He should follow up with Dr. Posey Pronto in the next 2 weeks after that for decision on immunosuppression discussed considerations are monthly IVIG versus IV Rituxan -Dr. Posey Pronto is aware of the patient and I have discussed this plan with her. -Incidental findings on CT with contrast of Chest/Abd/Pelvis to be followed up with colonic screening and yearly aortic screenings as recommended by radiology - to be followed up by outpatient PCP.  Neurology will be available as needed. Please call with questions.  Amie Portland, MD [facility] [phone]  If 7pm to 7am, please call on call as listed on AMION.

## 2016-11-21 NOTE — Progress Notes (Signed)
PROGRESS NOTE    Kamin Niblack  NUU:[id number] DOB: [birth year] DOA: 11/12/2016 PCP: Lavone Orn, MD    Brief Narrative: [name] a 76 y.o.malewith medical history significant of the Essential HTN, Prostate cancer s/p prostatectomy, ankylosing spondylitis, CIPDand Paget's disease. Patient presents with bilateral lower extremity weakness. Patient Unable to ambulatenow. Patient has had extensive workup including MRI, PET scan, and EMG studies between hereand [facility]. 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 Previously tried on IVIG. Other working differentials included paraneoplastic syndrome or chronic inflammatory polyradiculopathy.Neuorlogy consulted and evaluated and recommending Plasmapheresis (PLEX) for 5 days. PT evaluated and feel like patient is a candidate for CIR; Physiatrist evaluated and recommended CIR however patient is unable to progress out of bed with therapy due to Femoral Line for PLEX. Rehab Coordinator to meet with Patient and Family yesterday and wife toured [employer]. Will D/C patient to Rehab once medically stable.  Patient's Platelet Count dropped to 23 on 11/18/16 but likely was Spurious as when repeated Platelet Count was 415. Patient had no issues but complained of a sore Left Ankle on 9/17 which has now resolved. This morning he had no complaints except that he was not happy with getting CT Chest/Abd/Pelvis as he feels like he has had "plenty of imaging studies" in the last few months.   Patient did have IVIG fifth treatment today. CT scan of the abdomen and chest and pelvis was done yesterday. It reveals potential area of circumferential colonic wall thickening near the rectosigmoid junction. Patient needs colon cancer screening if not done. Right upper lobe pulmonary nodularity clustered likely postinflammatory. Mild limitation of the ascending aorta 4.2 cm recommend annual imaging.   Assessment & Plan:     Principal Problem:   Lower extremity weakness Active Problems:   Hypertension, essential   Leg weakness   Constipation   H/O prostate cancer   Paget's disease of bone   CIDP (chronic inflammatory demyelinating polyneuropathy) (HCC)   Leukocytosis   Hypophosphatemia   Dysphagia   Abnormal LFTs   Hyperbilirubinemia   Ankle pain  CIDP last ivig today.  HTN Continue lisinopril and metoprolol   DVT prophylaxis communication: Disposition Plan: I have discussed with the patient this morning he will get his last IVIG today and then will DC the femoral line. And then have PT and OT see him and decide about discharge to skilled rehabilitation versus home. He does appear frustrated and angry and depressed.    Consultants: Neurology  Procedures: Femoral  catheter  Antimicrobials:  None   Subjective: No new complaints  Objective: Vitals:   11/21/16 0400 11/21/16 0500 11/21/16 0813 11/21/16 1200  BP: (!) 147/58  114/67 119/68  Pulse: 86  (!) 110 95  Resp: (!) 22  (!) 23 (!) 28  Temp: 97.6 F (36.4 C)     TempSrc: Oral     SpO2: 97%  98% 100%  Weight:  67.2 kg (148 lb 2.4 oz)    Height:        Intake/Output Summary (Last 24 hours) at 11/21/16 1333 Last data filed at 11/21/16 0010  Gross per 24 hour  Intake                0 ml  Output              225 ml  Net             -225 ml   Filed Weights   11/19/16  1100 11/20/16 0423 11/21/16 0500  Weight: 72 kg (158 lb 11.7 oz) 66.3 kg (146 lb 2.6 oz) 67.2 kg (148 lb 2.4 oz)    Examination:  General exam: Appears calm and comfortable  Respiratory system: Clear to auscultation. Respiratory effort normal. Cardiovascular system: S1 & S2 heard, RRR. No JVD, murmurs, rubs, gallops or clicks. No pedal edema. Gastrointestinal system: Abdomen is nondistended, soft and nontender. No organomegaly or masses felt. Normal bowel sounds heard. Central nervous system: Alert and oriented. No focal neurological deficits. Extremities:  Symmetric 5 x 5 power. Skin: No rashes, lesions or ulcers Psychiatry: Judgement and insight appear normal. Mood & affect appropriate.     Data Reviewed: I have personally reviewed following labs and imaging studies  CBC:  Recent Labs Lab 11/18/16 0712 11/18/16 1046 11/19/16 0229 11/19/16 0927 11/20/16 0311 11/21/16 0238  WBC 7.6 9.0 9.3  --  11.7* 11.6*  NEUTROABS 4.8 6.5 5.9  --  8.1* 7.9*  HGB 13.4 12.9* 12.8* 11.6* 14.1 14.8  HCT 39.3 38.8* 37.7* 34.0* 41.6 43.2  MCV 95.4 96.5 95.7  --  95.0 95.2  PLT QUESTIONABLE RESULTS, RECOMMEND RECOLLECT TO VERIFY 415* 416*  --  445* 867*   Basic Metabolic Panel:  Recent Labs Lab 11/17/16 0339  11/18/16 0712 11/19/16 0229 11/19/16 0927 11/20/16 0311 11/21/16 0238  NA 141  < > 140 138 141 139 140  K 4.5  < > 3.6 3.5 3.7 3.7 4.5  CL 110  < > 111 110 103 106 102  CO2 28  --  23 23  --  26 31  GLUCOSE 102*  < > 103* 99 147* 104* 131*  BUN 18  < > 17 16 13 6 11   CREATININE 0.60*  < > 0.50* 0.46* 0.40* 0.50* 0.60*  CALCIUM 8.5*  --  8.3* 8.1*  --  8.5* 8.6*  MG 2.1  --  1.8 1.8  --  1.7 2.0  PHOS 3.7  --  3.7 3.4  --  3.2 3.8  < > = values in this interval not displayed. GFR: Estimated Creatinine Clearance: 72.3 mL/min (A) (by C-G formula based on SCr of 0.6 mg/dL (L)). Liver Function Tests:  Recent Labs Lab 11/17/16 0339 11/18/16 0712 11/19/16 0229 11/20/16 0311 11/21/16 0238  AST 29 19 21 19 22   ALT 15* 13* 13* 15* 18  ALKPHOS 30* 21* 26* 20* 31*  BILITOT 1.4* 1.2 0.6 1.6* 0.6  PROT 4.7* 4.4* 4.5* 4.9* 4.8*  ALBUMIN 3.4* 3.4* 3.2* 4.1 3.6   No results for input(s): LIPASE, AMYLASE in the last 168 hours. No results for input(s): AMMONIA in the last 168 hours. Coagulation Profile: No results for input(s): INR, PROTIME in the last 168 hours. Cardiac Enzymes: No results for input(s): CKTOTAL, CKMB, CKMBINDEX, TROPONINI in the last 168 hours. BNP (last 3 results) No results for input(s): PROBNP in the last 8760  hours. HbA1C: No results for input(s): HGBA1C in the last 72 hours. CBG: No results for input(s): GLUCAP in the last 168 hours. Lipid Profile: No results for input(s): CHOL, HDL, LDLCALC, TRIG, CHOLHDL, LDLDIRECT in the last 72 hours. Thyroid Function Tests: No results for input(s): TSH, T4TOTAL, FREET4, T3FREE, THYROIDAB in the last 72 hours. Anemia Panel: No results for input(s): VITAMINB12, FOLATE, FERRITIN, TIBC, IRON, RETICCTPCT in the last 72 hours. Sepsis Labs: No results for input(s): PROCALCITON, LATICACIDVEN in the last 168 hours.  No results found for this or any previous visit (from the past 240 hour(s)).  Radiology Studies: Ct Chest W Contrast  Result Date: 11/20/2016 CLINICAL DATA:  Bilateral leg weakness. 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 History of prostate cancer. EXAM: CT CHEST, ABDOMEN, AND PELVIS WITH CONTRAST TECHNIQUE: Multidetector CT imaging of the chest, abdomen and pelvis was performed following the standard protocol during bolus administration of intravenous contrast. CONTRAST:  100 ml ISOVUE-300 IOPAMIDOL (ISOVUE-300) INJECTION 61% COMPARISON:  Abdominopelvic CT 08/11/2016.  Lumbar MRI 09/24/2016. FINDINGS: CT CHEST FINDINGS Cardiovascular: No acute vascular findings are demonstrated. The ascending aorta is mildly dilated to 4.2 cm. There is mild atherosclerosis of the aorta and coronary arteries. The heart size is normal. There is no pericardial effusion. Mediastinum/Nodes: There are no enlarged mediastinal, hilar or axillary lymph nodes. The thyroid gland, trachea and esophagus demonstrate no significant findings. Lungs/Pleura: There is trace pleural fluid or thickening on the left. There is mild clustered nodularity in the right upper lobe, largest component measuring 5 mm on image 65. No suspicious pulmonary nodules are demonstrated. Musculoskeletal/Chest wall: No chest wall mass or suspicious osseous findings. CT ABDOMEN AND PELVIS FINDINGS Hepatobiliary:  Probable hepatic steatosis as before. No focal hepatic lesions are seen. Small gallstones are again noted. There is no gallbladder wall thickening or biliary dilatation. Pancreas: Unremarkable. No pancreatic ductal dilatation or surrounding inflammatory changes. Spleen: Normal in size without focal abnormality. Adrenals/Urinary Tract: Both adrenal glands appear normal. There are small bilateral renal cysts. There is no evidence of renal mass, urinary tract calculus or hydronephrosis. The distal right ureter is mildly dilated. The bladder appears unremarkable. Stomach/Bowel: The stomach, small bowel and proximal colon appear unremarkable. There are diverticular changes throughout the sigmoid colon. There is potential circumferential colonic wall thickening at the rectosigmoid junction (Axial image 114 and coronal image 67) as well as more superiorly (axial image 106). No evidence of bowel obstruction or perforation. Vascular/Lymphatic: There are no enlarged abdominal or pelvic lymph nodes. There is aortic and branch vessel atherosclerosis. Right femoral venous catheter in place. Reproductive: Prior prostatectomy.  No evidence of pelvic mass. Other: Mild soft tissue stranding in the perirectal fat. There is no ascites. Musculoskeletal: No acute or worrisome osseous findings. Sclerosis and thickened trabecula throughout the left hemipelvis and L5 vertebral body are again noted, most consistent with Paget's disease. IMPRESSION: 1. No findings typical for metastatic prostate cancer in the chest, abdomen or pelvis. 2. Potential areas of circumferential colonic wall thickening near the rectosigmoid junction. These could be secondary to peristalsis. Correlation with the patient's colon cancer screening history is recommended. If screening is not up-to-date, appropriate screening/sigmoidoscopy should be considered to exclude neoplasm. 3. Clustered right upper lobe pulmonary nodularity, likely postinflammatory. 4. Mild  dilatation of the ascending aorta to 4.2 cm. Recommend annual imaging followup by CTA or MRA. This recommendation follows 2010 ACCF/AHA/AATS/ACR/ASA/SCA/SCAI/SIR/STS/SVM Guidelines for the Diagnosis and Management of Patients with Thoracic Aortic Disease. Circulation. 2010; 121: e266-e369 5. Additional incidental findings including cholelithiasis, colonic diverticulosis and Paget's disease in the left pelvis. Electronically Signed   By: Richardean Sale M.D.   On: 11/20/2016 14:48   Ct Abdomen Pelvis W Contrast  Result Date: 11/20/2016 CLINICAL DATA:  Bilateral leg weakness. 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 History of prostate cancer. EXAM: CT CHEST, ABDOMEN, AND PELVIS WITH CONTRAST TECHNIQUE: Multidetector CT imaging of the chest, abdomen and pelvis was performed following the standard protocol during bolus administration of intravenous contrast. CONTRAST:  100 ml ISOVUE-300 IOPAMIDOL (ISOVUE-300) INJECTION 61% COMPARISON:  Abdominopelvic CT 08/11/2016.  Lumbar MRI 09/24/2016. FINDINGS: CT CHEST FINDINGS Cardiovascular: No acute vascular findings  are demonstrated. The ascending aorta is mildly dilated to 4.2 cm. There is mild atherosclerosis of the aorta and coronary arteries. The heart size is normal. There is no pericardial effusion. Mediastinum/Nodes: There are no enlarged mediastinal, hilar or axillary lymph nodes. The thyroid gland, trachea and esophagus demonstrate no significant findings. Lungs/Pleura: There is trace pleural fluid or thickening on the left. There is mild clustered nodularity in the right upper lobe, largest component measuring 5 mm on image 65. No suspicious pulmonary nodules are demonstrated. Musculoskeletal/Chest wall: No chest wall mass or suspicious osseous findings. CT ABDOMEN AND PELVIS FINDINGS Hepatobiliary: Probable hepatic steatosis as before. No focal hepatic lesions are seen. Small gallstones are again noted. There is no gallbladder wall thickening or biliary dilatation.  Pancreas: Unremarkable. No pancreatic ductal dilatation or surrounding inflammatory changes. Spleen: Normal in size without focal abnormality. Adrenals/Urinary Tract: Both adrenal glands appear normal. There are small bilateral renal cysts. There is no evidence of renal mass, urinary tract calculus or hydronephrosis. The distal right ureter is mildly dilated. The bladder appears unremarkable. Stomach/Bowel: The stomach, small bowel and proximal colon appear unremarkable. There are diverticular changes throughout the sigmoid colon. There is potential circumferential colonic wall thickening at the rectosigmoid junction (Axial image 114 and coronal image 67) as well as more superiorly (axial image 106). No evidence of bowel obstruction or perforation. Vascular/Lymphatic: There are no enlarged abdominal or pelvic lymph nodes. There is aortic and branch vessel atherosclerosis. Right femoral venous catheter in place. Reproductive: Prior prostatectomy.  No evidence of pelvic mass. Other: Mild soft tissue stranding in the perirectal fat. There is no ascites. Musculoskeletal: No acute or worrisome osseous findings. Sclerosis and thickened trabecula throughout the left hemipelvis and L5 vertebral body are again noted, most consistent with Paget's disease. IMPRESSION: 1. No findings typical for metastatic prostate cancer in the chest, abdomen or pelvis. 2. Potential areas of circumferential colonic wall thickening near the rectosigmoid junction. These could be secondary to peristalsis. Correlation with the patient's colon cancer screening history is recommended. If screening is not up-to-date, appropriate screening/sigmoidoscopy should be considered to exclude neoplasm. 3. Clustered right upper lobe pulmonary nodularity, likely postinflammatory. 4. 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 Recommend annual imaging followup by CTA or MRA. This recommendation follows 2010 ACCF/AHA/AATS/ACR/ASA/SCA/SCAI/SIR/STS/SVM  Guidelines for the Diagnosis and Management of Patients with Thoracic Aortic Disease. Circulation. 2010; 121: e266-e369 5. Additional incidental findings including cholelithiasis, colonic diverticulosis and Paget's disease in the left pelvis. Electronically Signed   By: Richardean Sale M.D.   On: 11/20/2016 14:48        Scheduled Meds: . aspirin  81 mg Oral Daily  . enoxaparin (LOVENOX) injection  40 mg Subcutaneous Q24H  . heparin  1,000 Units Intracatheter Once  . lisinopril  10 mg Oral Daily  . metoprolol succinate  25 mg Oral Daily  . polyethylene glycol  17 g Oral BID  . senna-docusate  2 tablet Oral BID   Continuous Infusions: . citrate dextrose    . potassium PHOSPHATE IVPB (mmol)       LOS: 9 days      Georgette Shell, MD [facility]   If 7PM-7AM, please contact night-coverage [email or web address] Password [email or web address] 11/21/2016, 1:33 PM

## 2016-11-21 NOTE — Progress Notes (Addendum)
Speech Language Pathology Discharge Patient Details Name: Cesareo Vickrey MRN: [id number] DOB: 11-Jan-1941 Today's Date: 11/21/2016 Time: 1440-     Patient discharged from SLP services secondary to: Explained reason for ST follow up. He states he is swallowing fine and "see no reason for speech/swallow therapy, unless you do" Explained results of MBS (likely compensating for prolonged swallow) and started to explain reason for ST however he is not interested in further ST.  Pt is on soft diet and thin liquids.  Please see latest therapy progress note for current level of functioning and progress toward goals.    Progress and discharge plan discussed with patient and/or caregiver: Patient/Caregiver agrees with plan  GO     [place] Siren 11/21/2016, 2:45 PM   Orbie Pyo Colvin Caroli.[employer] [phone]

## 2016-11-21 NOTE — Progress Notes (Signed)
PT Cancellation Note  Patient Details Name: Bubba Vanbenschoten MRN: [id number] DOB: Jun 03, 1940   Cancelled Treatment:    Reason Eval/Treat Not Completed: Patient at procedure or test/unavailable Pt off unit for plasmapheresis treatment. PT will continue to follow acutely.    [place] April, PTA Pager: [phone]   11/21/2016, 4:39 PM

## 2016-11-22 ENCOUNTER — Ambulatory Visit: Payer: Medicare Other | Admitting: Physical Therapy

## 2016-11-22 LAB — POCT I-STAT, CHEM 8
BUN: 19 mg/dL (ref 6–20)
CREATININE: 0.5 mg/dL — AB (ref 0.61–1.24)
Calcium, Ion: 1.14 mmol/L — ABNORMAL LOW (ref 1.15–1.40)
Chloride: 101 mmol/L (ref 101–111)
GLUCOSE: 117 mg/dL — AB (ref 65–99)
HCT: 41 % (ref 39.0–52.0)
HEMOGLOBIN: 13.9 g/dL (ref 13.0–17.0)
Potassium: 4.2 mmol/L (ref 3.5–5.1)
Sodium: 140 mmol/L (ref 135–145)
TCO2: 28 mmol/L (ref 22–32)

## 2016-11-22 NOTE — Progress Notes (Signed)
Physical Therapy Treatment/Re-Evaluation Patient Details Name: Eduardo Burch MRN: [id number] DOB: 1940/05/15 Today's Date: 11/22/2016    History of Present Illness Eduardo Burch is a 76 y.o. malewith medical history significant of the thecal HTN, prostate cancer s/p prostatectomy, ankylosing spondylitis, CIPD and Paget's disease(associated symptoms include difficulty swallowing, fevers, chills, night sweats, 50 pounds weight loss over last 4 months). Patient presents with bilateral lower extremity weakness resulting in a fall. 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  Dx with possible GBS exacerbation s/p IVIG treatments.      PT Comments    Pt was able to stand today EOB for ~1 min with two person assist and RW.  We used the standing frame for the transfer to the chair and we reviewed and gave a LE HEP to him to do while he is up in the chair 2xs/day.  Goals re-assessed and updated.  Pt remains appropriate for CIR level therapies at discharge.  PT will continue to follow acutely.   Follow Up Recommendations  CIR     Equipment Recommendations  Wheelchair (measurements PT);Wheelchair cushion (measurements PT)    Recommendations for Other Services Rehab consult     Precautions / Restrictions Precautions Precautions: Fall    Mobility  Bed Mobility Overal bed mobility: Needs Assistance Bed Mobility: Supine to Sit     Supine to sit: +2 for physical assistance;Mod assist     General bed mobility comments: Two person mod hand held assist to pull to sitting EOB verbal cues for hand placement.   Transfers Overall transfer level: Needs assistance Equipment used: Rolling walker (2 wheeled) Transfers: Sit to/from [facility] Sit to Stand: +2 physical assistance;Mod assist;From elevated surface Stand pivot transfers: +2 safety/equipment;From elevated surface (with stedy)       General transfer  comment: Two person mod assist to stand from elevated bed with bil knees blocked and RW stabilized by PT/OT.  Verbal cues for safe hand placement, forward motion to go up and extension at knees and hips at terminal stand. Mod assist at trunk to power up. Transferred with stedy standing frame for safe transfer to the chair.    Ambulation/Gait             General Gait Details: unable at this time.            Balance Overall balance assessment: Needs assistance Sitting-balance support: Feet supported;Bilateral upper extremity supported;Single extremity supported;No upper extremity supported Sitting balance-Leahy Scale: Fair Sitting balance - Comments: Once squared up and feet stable on ground supervision for EOB.  Not specifically challenged   Standing balance support: Bilateral upper extremity supported Standing balance-Leahy Scale: Poor Standing balance comment: Mod assist with RW and two people in standing.                             Cognition Arousal/Alertness: Awake/alert Behavior During Therapy: Anxious Overall Cognitive Status: Within Functional Limits for tasks assessed                                 General Comments: Pt with a bit of unrealistic expectations of the speed of his recovery.       Exercises General Exercises - Lower Extremity Long Arc Quad: AROM;Both;10 reps Hip Flexion/Marching: AAROM;Both;5 reps Toe Raises: AROM;Both;20 reps Heel Raises: AROM;Both;20 reps Other Exercises Other Exercises: LE HEP given  and reviewed with pt and wife.      General Comments General comments (skin integrity, edema, etc.): VSS throughout, BP mildly elevated, pt anxious and pursed lip breathing encouraged throughout. Re-evaluation completed as pt has been on hold for IVIG treatments over the last few days.  Pt's R femoral line has been pulled and he is ready to mobilize.  ankle 3/5 PF/DF, knee extension 3-/5, hip flexion 2/5 grossly assessed in  supine and seated position.  Pt reports no numbness or tingling.       Pertinent Vitals/Pain Pain Assessment: No/denies pain           PT Goals (current goals can now be found in the care plan section) Acute Rehab PT Goals Patient Stated Goal: get back to normal PT Goal Formulation: With patient Time For Goal Achievement: 12/06/16 Potential to Achieve Goals: Good Progress towards PT goals: Progressing toward goals (and goals updated)    Frequency    Min 3X/week      PT Plan Current plan remains appropriate (goals checked and updated)    Co-evaluation PT/OT/SLP Co-Evaluation/Treatment: Yes Reason for Co-Treatment: Complexity of the patient's impairments (multi-system involvement);For patient/therapist safety;To address functional/ADL transfers PT goals addressed during session: Mobility/safety with mobility;Balance;Strengthening/ROM;Proper use of DME        AM-PAC PT "6 Clicks" Daily Activity  Outcome Measure  Difficulty turning over in bed (including adjusting bedclothes, sheets and blankets)?: Unable Difficulty moving from lying on back to sitting on the side of the bed? : Unable Difficulty sitting down on and standing up from a chair with arms (e.g., wheelchair, bedside commode, etc,.)?: Unable Help needed moving to and from a bed to chair (including a wheelchair)?: A Lot Help needed walking in hospital room?: Total Help needed climbing 3-5 steps with a railing? : Total 6 Click Score: 7    End of Session Equipment Utilized During Treatment: Gait belt Activity Tolerance: Patient limited by fatigue Patient left: in chair;with call bell/phone within reach;with family/visitor present Nurse Communication: Mobility status;Need for lift equipment PT Visit Diagnosis: Unsteadiness on feet (R26.81);Other abnormalities of gait and mobility (R26.89);Muscle weakness (generalized) (M62.81);Difficulty in walking, not elsewhere classified (R26.2);Other symptoms and signs  involving the nervous system (D14.970)     Time: 2637-8588 PT Time Calculation (min) (ACUTE ONLY): 34 min Charges: 1 re-eval  Chrysten Woulfe B. [place], [place], DPT [phone]   11/22/2016, 12:04 PM

## 2016-11-22 NOTE — H&P (Signed)
Physical Medicine and Rehabilitation Admission H&P    Chief Complaint  Patient presents with  . Extremity Weakness  : HPI: Eduardo Burch is a 76 y.o. right handed male with history of hypertension, prostate cancer with prostatectomy, Paget's disease. Presented 11/12/2016 with bilateral lower extremity weakness as well as decrease in functional mobility that progressed over the past 4 months. Patient reports having recent fall as well as reported 50 pound weight loss over the last few months. 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 Two-level home with bedroom Main floor. Patient had had an extensive workup over the past few months including MRI, PET scan and EMG studies  completed at [facility] followed by neurology services Dr. Thresa Ross neurology as well at [facility].  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 CT of abdomen and chest as well as pelvis revealed potential area of circumferential colonic wall thickening near the rectosigmoid junction. No findings of typical metastatic prostate cancer in the chest abdomen or pelvis. Patient would follow-up neurology services as an outpatient after discharge and if no improvement decision on possible immunosuppression and monthly IVIG versus IV Rituxan. Subcutaneous Lovenox for DVT prophylaxis. Physical occupational therapy evaluation completed 11/14/2016 with recommendations of physical medicine rehabilitation consult.Patient was admitted for a comprehensive rehabilitation program.  Review of Systems  Constitutional: Negative for chills and fever.  HENT: Negative for hearing loss and tinnitus.   Eyes: Negative for blurred vision and double vision.  Respiratory: Negative for cough and shortness of breath.   Cardiovascular:  Negative for chest pain, palpitations and leg swelling.  Gastrointestinal: Positive for constipation. Negative for nausea and vomiting.  Genitourinary: Positive for urgency.  Musculoskeletal: Positive for back pain and myalgias.  Skin: Negative for rash.  Neurological: Positive for sensory change and weakness. Negative for seizures.  All other systems reviewed and are negative.  Past Medical History:  Diagnosis Date  . Constipated   . Elevated blood pressure reading   . 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 Hypercholesterolemia   . Hypertension   . Spondylosis    Past Surgical History:  Procedure Laterality Date  . PROATATECTOMY     Family History  Problem Relation Age of Onset  . Lung cancer Mother   . Spondylitis Father    Social History:  reports that he has quit smoking. His smoking use included Cigarettes. He has never used smokeless tobacco. He reports that he does not drink alcohol or use drugs. Allergies:  Allergies  Allergen Reactions  . Remicade [Infliximab] Anaphylaxis   Medications Prior to Admission  Medication Sig Dispense Refill  . acetaminophen (TYLENOL) 500 MG tablet Take 500-1,000 mg by mouth every 6 (six) hours as needed for headache.    Marland Kitchen aspirin 81 MG chewable tablet Chew 1 tablet (81 mg total) by mouth daily. 30 tablet 0  . docusate sodium (COLACE) 100 MG capsule Take 1 capsule (100 mg total) by mouth every 12 (twelve) hours. 60 capsule 0  . gabapentin (NEURONTIN) 300 MG capsule Take 300 mg by mouth at bedtime.    Marland Kitchen lisinopril (PRINIVIL,ZESTRIL) 10 MG tablet Take 1 tablet (10 mg total) by mouth daily. 90 tablet 3  . metoprolol succinate (TOPROL XL) 25 MG 24 hr tablet Take 1 tablet (  25 mg total) by mouth daily. 90 tablet 3  . tiZANidine (ZANAFLEX) 2 MG tablet Take 2 mg by mouth 3 (three) times daily.  5  . traMADol (ULTRAM) 50 MG tablet Take 1 tablet (50 mg total) by mouth every 6 (six) hours as needed for moderate pain or severe  pain. 30 tablet 0  . UNABLE TO FIND OUTPATIENT PHYSICAL THERAPY AND OCCUPATIONAL THERAPY  Dx: Guillain-Barre  Evaluation and Treat 1 each 0    Drug Regimen Review  Drug regimen was reviewed and remains appropriate with no significant issues identified  Home: Home Living Family/patient expects to be discharged to:: Private residence Living Arrangements: Spouse/significant other Available Help at Discharge: Family, Available 24 hours/day Type of Home: House Home Access: Stairs to enter [employer] of Steps: 5 Entrance Stairs-Rails: Right, Left Home Layout: Two level, Able to live on main level with bedroom/bathroom Alternate Level Stairs-Number of Steps: flight Bathroom Shower/Tub: Multimedia programmer: Handicapped height Bathroom Accessibility: Yes Home Equipment: Industrial/product designer History: Prior Function Level of Independence: Independent Comments: Patient works part-time at his company (desk job); used to be race Advertising account executive Status:  Mobility: Bed Mobility Overal bed mobility: Needs Assistance Bed Mobility: Supine to Sit Supine to sit: +2 for physical assistance, Mod assist General bed mobility comments: Two person mod hand held assist to pull to sitting EOB verbal cues for hand placement.  Transfers Overall transfer level: Needs assistance Equipment used: Rolling walker (2 wheeled) Transfer via Lift Equipment: Stedy Transfers: Sit to/from Stand, Risk manager Sit to Stand: +2 physical assistance, Mod assist, From elevated surface Stand pivot transfers: +2 safety/equipment, From elevated surface (with stedy) Squat pivot transfers: Max assist, +2 physical assistance, From elevated surface General transfer comment: Two person mod assist to stand from elevated bed with bil knees blocked and RW stabilized by PT/OT.  Verbal cues for safe hand placement, forward motion to go up and extension at knees and hips at terminal stand.  Mod assist at trunk to power up. Transferred with stedy standing frame for safe transfer to the chair.   Ambulation/Gait General Gait Details: unable at this time.    ADL: ADL Overall ADL's : Needs assistance/impaired Eating/Feeding: Modified independent, Sitting Grooming: Wash/dry hands, Wash/dry face, Set up, Sitting Grooming Details (indicate cue type and reason): in recliner Upper Body Bathing: Minimal assistance, Sitting Lower Body Bathing: Moderate assistance, Sitting/lateral leans Upper Body Dressing : Set up, Sitting Lower Body Dressing: Total assistance, Sit to/from stand Lower Body Dressing Details (indicate cue type and reason): to don socks BLE Toilet Transfer: Moderate assistance, +2 for physical assistance, Stand-pivot (using [employer] ) Armed forces technical officer Details (indicate cue type and reason): simulated through recliner transfer Toileting- Clothing Manipulation and Hygiene: Maximal assistance, Sit to/from stand Functional mobility during ADLs: Moderate assistance, +2 for physical assistance General ADL Comments: Session focused on bed level B UE strengthening exercises only as pt with temporary femoral HD catheter in R LE and R hip flexion is contraindicated. Pt fatigues easily with exercises and noted B UE tremoring as activity progressed.   Cognition: Cognition Overall Cognitive Status: Within Functional Limits for tasks assessed Orientation Level: Oriented X4 Cognition Arousal/Alertness: Awake/alert Behavior During Therapy: Anxious Overall Cognitive Status: Within Functional Limits for tasks assessed General Comments: Pt with a bit of unrealistic expectations of the speed of his recovery.   Physical Exam: Blood pressure (!) 146/80, pulse 94, temperature 98.1 F (36.7 C), temperature source Oral, resp. rate (!) 22,  height 6' (1.829 m), weight 67.6 kg (149 lb 0.5 oz), SpO2 100 %. Physical Exam  Vitals reviewed. HENT:  Head: Normocephalic and atraumatic.  Eyes: EOM are  normal. Left eye exhibits no discharge.  Neck: Normal range of motion. Neck supple. No thyromegaly present.  Cardiovascular: Normal rate, regular rhythm and normal heart sounds.  Exam reveals no gallop and no friction rub.   No murmur heard. Respiratory: Effort normal and breath sounds normal. No respiratory distress. He has no wheezes. He has no rales.  GI: Soft. Bowel sounds are normal. He exhibits no distension. There is no tenderness.  Musculoskeletal: He exhibits no edema or deformity.  Psychiatric: He has a normal mood and affect. His behavior is normal. Thought content normal.  Skin. Warm and dry Neurological:   Pt alert and oriented. Resting>intentional tremor.  Persistent, mild cogwheeling of arms > legs with PROM. Flat/masked facies. RUE 4/5prox to distal, LUE 4- to 4/5. RLE: 2+HF, 2+ to 3- KE and 3+/5 ADF/PF.  LLE 2/5 HF, 2+ KE and 3/5 ADF/PF DTR's tr to 1+, normal pain and light touch sense in all 4    Results for orders placed or performed during the hospital encounter of 11/12/16 (from the past 48 hour(s))  PSA, total and free     Status: None   Collection Time: 11/20/16  1:50 PM  Result Value Ref Range   PSA, Free <0.01 N/A ng/mL    Comment: (NOTE) Roche ECLIA methodology. Performed At: [facility] [place], [place] [id number] Lindon Romp MD [id number]    PSA, Free Pct UNABLE TO CALCULATE %    Comment: (NOTE) Unable to calculate result since non-numeric result obtained for component test. The table below lists the probability of prostate cancer for men with non-suspicious DRE results and total PSA between 4 and 10 ng/mL, by patient age Ricci Barker, [place], 100:[id number]).                  % Free PSA       50-64 yr        65-75 yr                  0.00-10.00%        56%             55%                 10.01-15.00%        24%             35%                 15.01-20.00%        17%             23%                 20.01-25.00%         10%             20%                      >25.00%         5%              9% Please note:  Catalona et al did not make specific              recommendations regarding the use of  percent free PSA for any other population              of men.    Prostate Specific Ag, Serum <0.1 0.0 - 4.0 ng/mL    Comment: (NOTE) Roche ECLIA methodology. According to the American Urological Association, Serum PSA should decrease and remain at undetectable levels after radical prostatectomy. The AUA defines biochemical recurrence as an initial PSA value 0.2 ng/mL or greater followed by a subsequent confirmatory PSA value 0.2 ng/mL or greater. Values obtained with different assay methods or kits cannot be used interchangeably. Results cannot be interpreted as absolute evidence of the presence or absence of malignant disease.   CBC with Differential/Platelet     Status: Abnormal   Collection Time: 11/21/16  2:38 AM  Result Value Ref Range   WBC 11.6 (H) 4.0 - 10.5 K/uL   RBC 4.54 4.22 - 5.81 MIL/uL   Hemoglobin 14.8 13.0 - 17.0 g/dL   HCT 43.2 39.0 - 52.0 %   MCV 95.2 78.0 - 100.0 fL   MCH 32.6 26.0 - 34.0 pg   MCHC 34.3 30.0 - 36.0 g/dL   RDW 15.0 11.5 - 15.5 %   Platelets 528 (H) 150 - 400 K/uL   Neutrophils Relative % 67 %   Neutro Abs 7.9 (H) 1.7 - 7.7 K/uL   Lymphocytes Relative 23 %   Lymphs Abs 2.7 0.7 - 4.0 K/uL   Monocytes Relative 8 %   Monocytes Absolute 0.9 0.1 - 1.0 K/uL   Eosinophils Relative 2 %   Eosinophils Absolute 0.2 0.0 - 0.7 K/uL   Basophils Relative 0 %   Basophils Absolute 0.0 0.0 - 0.1 K/uL  Comprehensive metabolic panel     Status: Abnormal   Collection Time: 11/21/16  2:38 AM  Result Value Ref Range   Sodium 140 135 - 145 mmol/L   Potassium 4.5 3.5 - 5.1 mmol/L    Comment: DELTA CHECK NOTED   Chloride 102 101 - 111 mmol/L   CO2 31 22 - 32 mmol/L   Glucose, Bld 131 (H) 65 - 99 mg/dL   BUN 11 6 - 20 mg/dL   Creatinine, Ser 0.60 (L) 0.61 - 1.24 mg/dL    Calcium 8.6 (L) 8.9 - 10.3 mg/dL   Total Protein 4.8 (L) 6.5 - 8.1 g/dL   Albumin 3.6 3.5 - 5.0 g/dL   AST 22 15 - 41 U/L   ALT 18 17 - 63 U/L   Alkaline Phosphatase 31 (L) 38 - 126 U/L   Total Bilirubin 0.6 0.3 - 1.2 mg/dL   GFR calc non Af Amer >60 >60 mL/min   GFR calc Af Amer >60 >60 mL/min    Comment: (NOTE) The eGFR has been calculated using the CKD EPI equation. This calculation has not been validated in all clinical situations. eGFR's persistently <60 mL/min signify possible Chronic Kidney Disease.    Anion gap 7 5 - 15  Magnesium     Status: None   Collection Time: 11/21/16  2:38 AM  Result Value Ref Range   Magnesium 2.0 1.7 - 2.4 mg/dL  Phosphorus     Status: None   Collection Time: 11/21/16  2:38 AM  Result Value Ref Range   Phosphorus 3.8 2.5 - 4.6 mg/dL  I-STAT, chem 8     Status: Abnormal   Collection Time: 11/21/16  4:50 PM  Result Value Ref Range   Sodium 140 135 - 145 mmol/L   Potassium 4.2 3.5 - 5.1 mmol/L  Chloride 101 101 - 111 mmol/L   BUN 19 6 - 20 mg/dL   Creatinine, Ser 0.50 (L) 0.61 - 1.24 mg/dL   Glucose, Bld 117 (H) 65 - 99 mg/dL   Calcium, Ion 1.14 (L) 1.15 - 1.40 mmol/L   TCO2 28 22 - 32 mmol/L   Hemoglobin 13.9 13.0 - 17.0 g/dL   HCT 41.0 39.0 - 52.0 %   Ct Chest W Contrast  Result Date: 11/20/2016 CLINICAL DATA:  Bilateral leg weakness. 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 History of prostate cancer. EXAM: CT CHEST, ABDOMEN, AND PELVIS WITH CONTRAST TECHNIQUE: Multidetector CT imaging of the chest, abdomen and pelvis was performed following the standard protocol during bolus administration of intravenous contrast. CONTRAST:  100 ml ISOVUE-300 IOPAMIDOL (ISOVUE-300) INJECTION 61% COMPARISON:  Abdominopelvic CT 08/11/2016.  Lumbar MRI 09/24/2016. FINDINGS: CT CHEST FINDINGS Cardiovascular: No acute vascular findings are demonstrated. The ascending aorta is mildly dilated to 4.2 cm. There is mild atherosclerosis of the aorta and coronary arteries.  The heart size is normal. There is no pericardial effusion. Mediastinum/Nodes: There are no enlarged mediastinal, hilar or axillary lymph nodes. The thyroid gland, trachea and esophagus demonstrate no significant findings. Lungs/Pleura: There is trace pleural fluid or thickening on the left. There is mild clustered nodularity in the right upper lobe, largest component measuring 5 mm on image 65. No suspicious pulmonary nodules are demonstrated. Musculoskeletal/Chest wall: No chest wall mass or suspicious osseous findings. CT ABDOMEN AND PELVIS FINDINGS Hepatobiliary: Probable hepatic steatosis as before. No focal hepatic lesions are seen. Small gallstones are again noted. There is no gallbladder wall thickening or biliary dilatation. Pancreas: Unremarkable. No pancreatic ductal dilatation or surrounding inflammatory changes. Spleen: Normal in size without focal abnormality. Adrenals/Urinary Tract: Both adrenal glands appear normal. There are small bilateral renal cysts. There is no evidence of renal mass, urinary tract calculus or hydronephrosis. The distal right ureter is mildly dilated. The bladder appears unremarkable. Stomach/Bowel: The stomach, small bowel and proximal colon appear unremarkable. There are diverticular changes throughout the sigmoid colon. There is potential circumferential colonic wall thickening at the rectosigmoid junction (Axial image 114 and coronal image 67) as well as more superiorly (axial image 106). No evidence of bowel obstruction or perforation. Vascular/Lymphatic: There are no enlarged abdominal or pelvic lymph nodes. There is aortic and branch vessel atherosclerosis. Right femoral venous catheter in place. Reproductive: Prior prostatectomy.  No evidence of pelvic mass. Other: Mild soft tissue stranding in the perirectal fat. There is no ascites. Musculoskeletal: No acute or worrisome osseous findings. Sclerosis and thickened trabecula throughout the left hemipelvis and L5 vertebral  body are again noted, most consistent with Paget's disease. IMPRESSION: 1. No findings typical for metastatic prostate cancer in the chest, abdomen or pelvis. 2. Potential areas of circumferential colonic wall thickening near the rectosigmoid junction. These could be secondary to peristalsis. Correlation with the patient's colon cancer screening history is recommended. If screening is not up-to-date, appropriate screening/sigmoidoscopy should be considered to exclude neoplasm. 3. Clustered right upper lobe pulmonary nodularity, likely postinflammatory. 4. 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 Recommend annual imaging followup by CTA or MRA. This recommendation follows 2010 ACCF/AHA/AATS/ACR/ASA/SCA/SCAI/SIR/STS/SVM Guidelines for the Diagnosis and Management of Patients with Thoracic Aortic Disease. Circulation. 2010; 121: e266-e369 5. Additional incidental findings including cholelithiasis, colonic diverticulosis and Paget's disease in the left pelvis. Electronically Signed   By: Richardean Sale M.D.   On: 11/20/2016 14:48   Ct Abdomen Pelvis W Contrast  Result Date: 11/20/2016 CLINICAL DATA:  Bilateral leg weakness. 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 History of prostate cancer. EXAM: CT CHEST, ABDOMEN, AND PELVIS WITH CONTRAST TECHNIQUE: Multidetector CT imaging of the chest, abdomen and pelvis was performed following the standard protocol during bolus administration of intravenous contrast. CONTRAST:  100 ml ISOVUE-300 IOPAMIDOL (ISOVUE-300) INJECTION 61% COMPARISON:  Abdominopelvic CT 08/11/2016.  Lumbar MRI 09/24/2016. FINDINGS: CT CHEST FINDINGS Cardiovascular: No acute vascular findings are demonstrated. The ascending aorta is mildly dilated to 4.2 cm. There is mild atherosclerosis of the aorta and coronary arteries. The heart size is normal. There is no pericardial effusion. Mediastinum/Nodes: There are no enlarged mediastinal, hilar or axillary lymph nodes. The thyroid gland, trachea and  esophagus demonstrate no significant findings. Lungs/Pleura: There is trace pleural fluid or thickening on the left. There is mild clustered nodularity in the right upper lobe, largest component measuring 5 mm on image 65. No suspicious pulmonary nodules are demonstrated. Musculoskeletal/Chest wall: No chest wall mass or suspicious osseous findings. CT ABDOMEN AND PELVIS FINDINGS Hepatobiliary: Probable hepatic steatosis as before. No focal hepatic lesions are seen. Small gallstones are again noted. There is no gallbladder wall thickening or biliary dilatation. Pancreas: Unremarkable. No pancreatic ductal dilatation or surrounding inflammatory changes. Spleen: Normal in size without focal abnormality. Adrenals/Urinary Tract: Both adrenal glands appear normal. There are small bilateral renal cysts. There is no evidence of renal mass, urinary tract calculus or hydronephrosis. The distal right ureter is mildly dilated. The bladder appears unremarkable. Stomach/Bowel: The stomach, small bowel and proximal colon appear unremarkable. There are diverticular changes throughout the sigmoid colon. There is potential circumferential colonic wall thickening at the rectosigmoid junction (Axial image 114 and coronal image 67) as well as more superiorly (axial image 106). No evidence of bowel obstruction or perforation. Vascular/Lymphatic: There are no enlarged abdominal or pelvic lymph nodes. There is aortic and branch vessel atherosclerosis. Right femoral venous catheter in place. Reproductive: Prior prostatectomy.  No evidence of pelvic mass. Other: Mild soft tissue stranding in the perirectal fat. There is no ascites. Musculoskeletal: No acute or worrisome osseous findings. Sclerosis and thickened trabecula throughout the left hemipelvis and L5 vertebral body are again noted, most consistent with Paget's disease. IMPRESSION: 1. No findings typical for metastatic prostate cancer in the chest, abdomen or pelvis. 2. Potential  areas of circumferential colonic wall thickening near the rectosigmoid junction. These could be secondary to peristalsis. Correlation with the patient's colon cancer screening history is recommended. If screening is not up-to-date, appropriate screening/sigmoidoscopy should be considered to exclude neoplasm. 3. Clustered right upper lobe pulmonary nodularity, likely postinflammatory. 4. 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 Recommend annual imaging followup by CTA or MRA. This recommendation follows 2010 ACCF/AHA/AATS/ACR/ASA/SCA/SCAI/SIR/STS/SVM Guidelines for the Diagnosis and Management of Patients with Thoracic Aortic Disease. Circulation. 2010; 121: e266-e369 5. Additional incidental findings including cholelithiasis, colonic diverticulosis and Paget's disease in the left pelvis. Electronically Signed   By: Richardean Sale M.D.   On: 11/20/2016 14:48       Medical Problem List and Plan: 1.  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  -admit to inpatient rehab 2.  DVT Prophylaxis/Anticoagulation: Subcutaneous Lovenox. Monitor platelet counts and any signs of bleeding. Check vascular studies 3. Pain Management: Ultram 50 mg every 6 hours as needed, Zanaflex 2 mg every 8 hours as needed 4. Mood: Provide emotional support 5. Neuropsych: This patient is capable of making decisions on his own behalf. 6. Skin/Wound Care: Routine skin  checks 7. Fluids/Electrolytes/Nutrition: Routine I&O with follow-up chemistries 8. Hypertension. Lisinopril 10 mg daily, Toprol 25 mg daily. Monitor with increased mobility 9. History of prostate cancer status post prostatectomy. CT chest abdomen and pelvis showed no findings typical for metastatic prostate cancer 10. Constipation. Laxative assistance   Post Admission Physician Evaluation: 1. Functional deficits secondary  to CIDP. 2. Patient is admitted to receive  collaborative, interdisciplinary care between the physiatrist, rehab nursing staff, and therapy team. 3. Patient's level of medical complexity and substantial therapy needs in context of that medical necessity cannot be provided at a lesser intensity of care such as a SNF. 4. Patient has experienced substantial functional loss from his/her baseline which was documented above under the "Functional History" and "Functional Status" headings.  Judging by the patient's diagnosis, physical exam, and functional history, the patient has potential for functional progress which will result in measurable gains while on inpatient rehab.  These gains will be of substantial and practical use upon discharge  in facilitating mobility and self-care at the household level. 5. Physiatrist will provide 24 hour management of medical needs as well as oversight of the therapy plan/treatment and provide guidance as appropriate regarding the interaction of the two. 6. The Preadmission Screening has been reviewed and patient status is unchanged unless otherwise stated above. 7. 24 hour rehab nursing will assist with bladder management, bowel management, safety, skin/wound care, disease management, medication administration, pain management and patient education  and help integrate therapy concepts, techniques,education, etc. 8. PT will assess and treat for/with: Lower extremity strength, range of motion, stamina, balance, functional mobility, safety, adaptive techniques and equipment, NMR, family ed, pain control.   Goals are: supervision. 9. OT will assess and treat for/with: ADL's, functional mobility, safety, upper extremity strength, adaptive techniques and equipment, NMR, ego support, community reintegration.   Goals are: supervision. Therapy may proceed with showering this patient. 10. SLP will assess and treat for/with: n/a.  Goals are: n/a. 11. Case Management and Social Worker will assess and treat for psychological issues  and discharge planning. 12. Team conference will be held weekly to assess progress toward goals and to determine barriers to discharge. 13. Patient will receive at least 3 hours of therapy per day at least 5 days per week. 14. ELOS: 14-18 days       15. Prognosis:  excellent     Meredith Staggers, MD, [facility] 11/23/2016  Cathlyn Parsons., PA-C 11/22/2016

## 2016-11-22 NOTE — Evaluation (Signed)
Occupational Therapy Evaluation Patient Details Name: Eduardo Burch MRN: [id number] DOB: 1940/04/15 Today's Date: 11/22/2016    History of Present Illness Eduardo Burch is a 76 y.o. malewith medical history significant of the thecal HTN, prostate cancer s/p prostatectomy, ankylosing spondylitis, CIPD and Paget's disease(associated symptoms include difficulty swallowing, fevers, chills, night sweats, 50 pounds weight loss over last 4 months). Patient presents with bilateral lower extremity weakness resulting in a fall. 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  Dx with possible GBS exacerbation s/p IVIG treatments.     Clinical Impression   .Pt seen with PT.  Pt demonstrates improved activity tolerance today and improved ability to assist with functional transfers and ADLs.  He currently requires mod A +2 for sit to stand and transfer to recliner using stedy.  He requires min a for UB ADLs and max A for LB ADLs.  Continue to feel he will benefit from CIR to maximize his safety and independence with ADLs.     Follow Up Recommendations  CIR;Supervision/Assistance - 24 hour    Equipment Recommendations  None recommended by OT    Recommendations for Other Services       Precautions / Restrictions Precautions Precautions: Fall      Mobility Bed Mobility Overal bed mobility: Needs Assistance Bed Mobility: Supine to Sit     Supine to sit: +2 for physical assistance;Mod assist     General bed mobility comments: Two person mod hand held assist to pull to sitting EOB verbal cues for hand placement.   Transfers Overall transfer level: Needs assistance Equipment used: Rolling walker (2 wheeled) Transfers: Sit to/from [facility] Sit to Stand: +2 physical assistance;Mod assist;From elevated surface Stand pivot transfers: +2 safety/equipment;From elevated surface (with stedy)       General transfer  comment: Two person mod assist to stand from elevated bed with bil knees blocked and RW stabilized by PT/OT.  Verbal cues for safe hand placement, forward motion to go up and extension at knees and hips at terminal stand. Mod assist at trunk to power up. Transferred with stedy standing frame for safe transfer to the chair.      Balance Overall balance assessment: Needs assistance Sitting-balance support: Feet supported;Bilateral upper extremity supported;Single extremity supported;No upper extremity supported Sitting balance-Leahy Scale: Fair Sitting balance - Comments: Once squared up and feet stable on ground supervision for EOB.  Not specifically challenged   Standing balance support: Bilateral upper extremity supported Standing balance-Leahy Scale: Poor Standing balance comment: Mod assist with RW and two people in standing.                            ADL either performed or assessed with clinical judgement   ADL Overall ADL's : Needs assistance/impaired Eating/Feeding: Modified independent;Sitting   Grooming: Wash/dry hands;Wash/dry face;Set up;Sitting   Upper Body Bathing: Minimal assistance;Sitting   Lower Body Bathing: Moderate assistance;Sitting/lateral leans   Upper Body Dressing : Set up;Sitting   Lower Body Dressing: Total assistance;Sit to/from stand   Toilet Transfer: Moderate assistance;+2 for physical assistance;Stand-pivot (using Stedy )   Toileting- Clothing Manipulation and Hygiene: Maximal assistance;Sit to/from stand       Functional mobility during ADLs: Moderate assistance;+2 for physical assistance       Vision         Perception     Praxis      Pertinent Vitals/Pain Pain Assessment: No/denies pain  Hand Dominance     Extremity/Trunk Assessment             Communication     Cognition Arousal/Alertness: Awake/alert Behavior During Therapy: Anxious Overall Cognitive Status: Within Functional Limits for tasks assessed                                  General Comments: Pt with a bit of unrealistic expectations of the speed of his recovery.    General Comments  VSS.  Pt asking if he will be back to playing tennis in 3 weeks.  Discussed making smaller, daily goals for now and work on increasing consistency with activity.     Exercises Exercises: General Lower Extremity;Other exercises General Exercises - Lower Extremity Long Arc Quad: AROM;Both;10 reps Hip Flexion/Marching: AAROM;Both;5 reps Toe Raises: AROM;Both;20 reps Heel Raises: AROM;Both;20 reps Other Exercises Other Exercises: Pt reports doing theraband exercises for UEs, but feels they are very easy.  T-band repositioned on the bed to increase resistance    Shoulder Instructions      Home Living                                          Prior Functioning/Environment                   OT Problem List:        OT Treatment/Interventions:      OT Goals(Current goals can be found in the care plan section) Acute Rehab OT Goals Patient Stated Goal: get back to normal  OT Frequency: Min 3X/week   Barriers to D/C:            Co-evaluation PT/OT/SLP Co-Evaluation/Treatment: Yes Reason for Co-Treatment: Complexity of the patient's impairments (multi-system involvement);To address functional/ADL transfers;For patient/therapist safety PT goals addressed during session: Mobility/safety with mobility;Balance;Strengthening/ROM;Proper use of DME OT goals addressed during session: Strengthening/ROM;ADL's and self-care      AM-PAC PT "6 Clicks" Daily Activity     Outcome Measure Help from another person eating meals?: None Help from another person taking care of personal grooming?: A Little Help from another person toileting, which includes using toliet, bedpan, or urinal?: A Lot Help from another person bathing (including washing, rinsing, drying)?: A Lot Help from another person to put on and taking off  regular upper body clothing?: A Little Help from another person to put on and taking off regular lower body clothing?: A Lot 6 Click Score: 16   End of Session    Activity Tolerance:   Patient left:    OT Visit Diagnosis: Unsteadiness on feet (R26.81);Other abnormalities of gait and mobility (R26.89);Muscle weakness (generalized) (M62.81);Other symptoms and signs involving the nervous system (R29.898)                Time: 9470-9628 OT Time Calculation (min): 33 min Charges:  OT General Charges $OT Visit: 1 Visit OT Treatments $Therapeutic Activity: 8-22 mins G-Codes:     [facility], OTR/L [phone]   Lucille Passy M 11/22/2016, 12:20 PM

## 2016-11-22 NOTE — Progress Notes (Signed)
PROGRESS NOTE    Eduardo Burch  FXT:[id number] DOB: 1940-10-15 DOA: 11/12/2016 PCP: Lavone Orn, MD   Brief Narrative: Eduardo Burch a 76 y.o.malewith medical history significant of the Essential HTN, Prostate cancer s/p prostatectomy, ankylosing spondylitis, CIPDand Paget's disease. Patient presents with bilateral lower extremity weakness. Patient Unable to ambulatenow. Patient has had extensive workup including MRI, PET scan, and [facility] studies [facility]. 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 Previously tried on IVIG. Other working differentials included paraneoplastic syndrome or chronic inflammatory polyradiculopathy.Neuorlogy consulted and evaluated and recommending Plasmapheresis (PLEX) for 5 days. PT evaluated and feel like patient is a candidate for CIR; Physiatrist evaluated and recommended CIR however patient is unable to progress out of bed with therapy due to Femoral Line for PLEX. Rehab Coordinator to meet with Patient and Family yesterday and wife toured [employer]. Will D/C patient to Rehab once medically stable.  Patient's Platelet Count dropped to 23 on 11/18/16 but likely was Spurious as when repeated Platelet Count was 415. Patient had no issues but complained of a sore Left Ankle on 9/17 which has now resolved. This morning he had no complaints except that he was not happy with getting CT Chest/Abd/Pelvis as he feels like he has had "plenty of imaging studies" in the last few months.   Patient did have IVIG fifth treatment yesterday. CT scan of the abdomen and chest and pelvis was done yesterday. It reveals potential area of circumferential colonic wall thickening near the rectosigmoid junction. Patient needs colon cancer screening if not done. Right upper lobe pulmonary nodularity clustered likely postinflammatory. Mild limitation of the ascending aorta 4.2 cm recommend annual imaging.      Assessment & Plan:   Principal Problem:   Lower extremity weakness Active Problems:   Hypertension, essential   Leg weakness   Constipation   H/O prostate cancer   Paget's disease of bone   CIDP (chronic inflammatory demyelinating polyneuropathy) (HCC)   Leukocytosis   Hypophosphatemia   Dysphagia   Abnormal LFTs   Hyperbilirubinemia   Ankle pain cidp inaptient rehab to be authherized by insurance hopefully tomorrow.PT/OT/CM notes reviewed.    DVT prophylaxis: lovenox Code Status: full Family Communication: wife Disposition Plan: to cir tomorrow if insurance authorizes.   Consultants: neuro Procedures: ivig/plasmapheresis.  Antimicrobials: none   Subjective:wants to go to rehab   Objective: Vitals:   11/22/16 0003 11/22/16 0422 11/22/16 0749 11/22/16 1300  BP: (!) 148/73 (!) 154/79 (!) 146/80 (!) 150/71  Pulse: 86 82 94 (!) 106  Resp: (!) 22 (!) 21 (!) 22 (!) 23  Temp: 98.6 F (37 C) 98.6 F (37 C) 98.1 F (36.7 C) 97.6 F (36.4 C)  TempSrc: Oral Oral Oral Oral  SpO2: 99% 98% 100% 100%  Weight:  67.6 kg (149 lb 0.5 oz)    Height:        Intake/Output Summary (Last 24 hours) at 11/22/16 1547 Last data filed at 11/22/16 1300  Gross per 24 hour  Intake              740 ml  Output              550 ml  Net              190 ml   Filed Weights   11/21/16 0500 11/21/16 1835 11/22/16 0422  Weight: 67.2 kg (148 lb 2.4 oz) 73.5 kg (162 lb 0.6 oz) 67.6 kg (149 lb 0.5  oz)    Examination:  General exam: Appears calm and comfortable  Respiratory system: Clear to auscultation. Respiratory effort normal. Cardiovascular system: S1 & S2 heard, RRR. No JVD, murmurs, rubs, gallops or clicks. No pedal edema. Gastrointestinal system: Abdomen is nondistended, soft and nontender. No organomegaly or masses felt. Normal bowel sounds heard. Central nervous system: Alert and oriented. No focal neurological deficits. Extremities: Symmetric 5 x 5 power. Skin: No rashes, lesions or  ulcers Psychiatry: Judgement and insight appear normal. Mood & affect appropriate.     Data Reviewed: I have personally reviewed following labs and imaging studies  CBC:  Recent Labs Lab 11/18/16 0712 11/18/16 1046 11/19/16 0229 11/19/16 0927 11/20/16 0311 11/21/16 0238 11/21/16 1650  WBC 7.6 9.0 9.3  --  11.7* 11.6*  --   NEUTROABS 4.8 6.5 5.9  --  8.1* 7.9*  --   HGB 13.4 12.9* 12.8* 11.6* 14.1 14.8 13.9  HCT 39.3 38.8* 37.7* 34.0* 41.6 43.2 41.0  MCV 95.4 96.5 95.7  --  95.0 95.2  --   PLT QUESTIONABLE RESULTS, RECOMMEND RECOLLECT TO VERIFY 415* 416*  --  445* 528*  --    Basic Metabolic Panel:  Recent Labs Lab 11/17/16 0339  11/18/16 0712 11/19/16 0229 11/19/16 0927 11/20/16 0311 11/21/16 0238 11/21/16 1650  NA 141  < > 140 138 141 139 140 140  K 4.5  < > 3.6 3.5 3.7 3.7 4.5 4.2  CL 110  < > 111 110 103 106 102 101  CO2 28  --  23 23  --  26 31  --   GLUCOSE 102*  < > 103* 99 147* 104* 131* 117*  BUN 18  < > 17 16 13 6 11 19   CREATININE 0.60*  < > 0.50* 0.46* 0.40* 0.50* 0.60* 0.50*  CALCIUM 8.5*  --  8.3* 8.1*  --  8.5* 8.6*  --   MG 2.1  --  1.8 1.8  --  1.7 2.0  --   PHOS 3.7  --  3.7 3.4  --  3.2 3.8  --   < > = values in this interval not displayed. GFR: Estimated Creatinine Clearance: 72.8 mL/min (A) (by C-G formula based on SCr of 0.5 mg/dL (L)). Liver Function Tests:  Recent Labs Lab 11/17/16 0339 11/18/16 0712 11/19/16 0229 11/20/16 0311 11/21/16 0238  AST 29 19 21 19 22   ALT 15* 13* 13* 15* 18  ALKPHOS 30* 21* 26* 20* 31*  BILITOT 1.4* 1.2 0.6 1.6* 0.6  PROT 4.7* 4.4* 4.5* 4.9* 4.8*  ALBUMIN 3.4* 3.4* 3.2* 4.1 3.6   No results for input(s): LIPASE, AMYLASE in the last 168 hours. No results for input(s): AMMONIA in the last 168 hours. Coagulation Profile: No results for input(s): INR, PROTIME in the last 168 hours. Cardiac Enzymes: No results for input(s): CKTOTAL, CKMB, CKMBINDEX, TROPONINI in the last 168 hours. BNP (last 3  results) No results for input(s): PROBNP in the last 8760 hours. HbA1C: No results for input(s): HGBA1C in the last 72 hours. CBG: No results for input(s): GLUCAP in the last 168 hours. Lipid Profile: No results for input(s): CHOL, HDL, LDLCALC, TRIG, CHOLHDL, LDLDIRECT in the last 72 hours. Thyroid Function Tests: No results for input(s): TSH, T4TOTAL, FREET4, T3FREE, THYROIDAB in the last 72 hours. Anemia Panel: No results for input(s): VITAMINB12, FOLATE, FERRITIN, TIBC, IRON, RETICCTPCT in the last 72 hours. Sepsis Labs: No results for input(s): PROCALCITON, LATICACIDVEN in the last 168 hours.  No results found  for this or any previous visit (from the past 240 hour(s)).       Radiology Studies: No results found.      Scheduled Meds: . aspirin  81 mg Oral Daily  . enoxaparin (LOVENOX) injection  40 mg Subcutaneous Q24H  . lisinopril  10 mg Oral Daily  . metoprolol succinate  25 mg Oral Daily  . polyethylene glycol  17 g Oral BID  . senna-docusate  2 tablet Oral BID   Continuous Infusions:   LOS: 10 days     Georgette Shell, MD [facility]  If 7PM-7AM, please contact night-coverage [email or web address] Password [facility] 11/22/2016, 3:47 PM

## 2016-11-22 NOTE — Care Management Important Message (Signed)
Important Message  Patient Details  Name: Eduardo Burch MRN: [id number] Date of Birth: [birth year]   Medicare Important Message Given:  Yes    Lady Wisham Abena 11/22/2016, 10:01 AM

## 2016-11-22 NOTE — Progress Notes (Signed)
Per MD order, femoral HD central line removed. IV cathter intact. Vaseline pressure gauze to site, pressure held x 10 min, no bleeding to site. Pt instructed not to get out of bed for 30 min after the removal of the HD central line. Instucted to keep dressing CDI x 24hours, if bleeding occurs hold pressure and call staff nurse. Pt verbalized understanding and did not have any questions. [employer]

## 2016-11-22 NOTE — Progress Notes (Signed)
I will begin insurance authorization with [facility] Medicare for  possible inpt rehab admit tomorrow pending insurance approval. I met with pt and his wife at bedside and they are aware. [phone]

## 2016-11-22 NOTE — Progress Notes (Addendum)
Noted femoral catheter d/c'd. I await PT and OT evaluations now that PLEX complete and femoral catheter out to assist in determining rehab venue options. Discussed with RN CM. [phone]

## 2016-11-23 ENCOUNTER — Encounter (HOSPITAL_COMMUNITY): Payer: Self-pay | Admitting: Nurse Practitioner

## 2016-11-23 ENCOUNTER — Inpatient Hospital Stay (HOSPITAL_COMMUNITY)
Admission: RE | Admit: 2016-11-23 | Discharge: 2016-12-15 | DRG: 042 | Disposition: A | Discharge status: Home / Self Care | Payer: Medicare Other | Source: Intra-hospital | Attending: Physical Medicine & Rehabilitation | Admitting: Physical Medicine & Rehabilitation

## 2016-11-23 DIAGNOSIS — Z5329 Procedure and treatment not carried out because of patient's decision for other reasons: Secondary | ICD-10-CM | POA: Diagnosis not present

## 2016-11-23 DIAGNOSIS — Z87891 Personal history of nicotine dependence: Secondary | ICD-10-CM

## 2016-11-23 DIAGNOSIS — G6181 Chronic inflammatory demyelinating polyneuritis: Principal | ICD-10-CM | POA: Diagnosis present

## 2016-11-23 DIAGNOSIS — R531 Weakness: Secondary | ICD-10-CM | POA: Diagnosis present

## 2016-11-23 DIAGNOSIS — Z8546 Personal history of malignant neoplasm of prostate: Secondary | ICD-10-CM

## 2016-11-23 DIAGNOSIS — Z7982 Long term (current) use of aspirin: Secondary | ICD-10-CM

## 2016-11-23 DIAGNOSIS — Z9079 Acquired absence of other genital organ(s): Secondary | ICD-10-CM | POA: Diagnosis not present

## 2016-11-23 DIAGNOSIS — M7918 Myalgia, other site: Secondary | ICD-10-CM | POA: Diagnosis not present

## 2016-11-23 DIAGNOSIS — I959 Hypotension, unspecified: Secondary | ICD-10-CM | POA: Diagnosis not present

## 2016-11-23 DIAGNOSIS — I10 Essential (primary) hypertension: Secondary | ICD-10-CM | POA: Diagnosis present

## 2016-11-23 DIAGNOSIS — Z888 Allergy status to other drugs, medicaments and biological substances status: Secondary | ICD-10-CM

## 2016-11-23 DIAGNOSIS — M7989 Other specified soft tissue disorders: Secondary | ICD-10-CM | POA: Diagnosis not present

## 2016-11-23 DIAGNOSIS — E7800 Pure hypercholesterolemia, unspecified: Secondary | ICD-10-CM | POA: Diagnosis present

## 2016-11-23 DIAGNOSIS — R634 Abnormal weight loss: Secondary | ICD-10-CM | POA: Diagnosis present

## 2016-11-23 DIAGNOSIS — I952 Hypotension due to drugs: Secondary | ICD-10-CM | POA: Diagnosis not present

## 2016-11-23 DIAGNOSIS — Z79899 Other long term (current) drug therapy: Secondary | ICD-10-CM | POA: Diagnosis not present

## 2016-11-23 DIAGNOSIS — R0989 Other specified symptoms and signs involving the circulatory and respiratory systems: Secondary | ICD-10-CM | POA: Diagnosis not present

## 2016-11-23 DIAGNOSIS — R29898 Other symptoms and signs involving the musculoskeletal system: Secondary | ICD-10-CM | POA: Diagnosis not present

## 2016-11-23 DIAGNOSIS — Z79891 Long term (current) use of opiate analgesic: Secondary | ICD-10-CM | POA: Diagnosis not present

## 2016-11-23 DIAGNOSIS — I951 Orthostatic hypotension: Secondary | ICD-10-CM | POA: Diagnosis not present

## 2016-11-23 DIAGNOSIS — M889 Osteitis deformans of unspecified bone: Secondary | ICD-10-CM | POA: Diagnosis present

## 2016-11-23 DIAGNOSIS — E876 Hypokalemia: Secondary | ICD-10-CM | POA: Diagnosis not present

## 2016-11-23 DIAGNOSIS — K5900 Constipation, unspecified: Secondary | ICD-10-CM | POA: Diagnosis present

## 2016-11-23 LAB — CREATININE, SERUM
Creatinine, Ser: 0.51 mg/dL — ABNORMAL LOW (ref 0.61–1.24)
GFR calc Af Amer: >60 mL/min (ref >60)
GFR calc non Af Amer: >60 mL/min (ref >60)

## 2016-11-23 LAB — CBC
HCT: 38.5 % — ABNORMAL LOW (ref 39.0–52.0)
Hemoglobin: 12.9 g/dL — ABNORMAL LOW (ref 13.0–17.0)
MCH: 32.3 pg (ref 26.0–34.0)
MCHC: 33.5 g/dL (ref 30.0–36.0)
MCV: 96.3 fL (ref 78.0–100.0)
Platelets: 491 10*3/uL — ABNORMAL HIGH (ref 150–400)
RBC: 4 MIL/uL — AB (ref 4.22–5.81)
RDW: 15 % (ref 11.5–15.5)
WBC: 10.9 10*3/uL — ABNORMAL HIGH (ref 4.0–10.5)

## 2016-11-23 MED ORDER — ASPIRIN 81 MG PO CHEW
81.0000 mg | CHEWABLE_TABLET | Freq: Every day | ORAL | Status: DC
  Administered 2016-11-24 – 2016-12-15 (×22): 81 mg via ORAL
  Filled 2016-11-23 (×22): qty 1

## 2016-11-23 MED ORDER — ENOXAPARIN SODIUM 40 MG/0.4ML ~~LOC~~ SOLN: 40.0000 mg | SUBCUTANEOUS | Status: DC

## 2016-11-23 MED ORDER — TRAMADOL HCL 50 MG PO TABS
50.0000 mg | ORAL_TABLET | Freq: Four times a day (QID) | ORAL | Status: DC | PRN
  Administered 2016-11-28 – 2016-12-14 (×33): 50 mg via ORAL
  Filled 2016-11-23 (×34): qty 1

## 2016-11-23 MED ORDER — SORBITOL 70 % SOLN: 30.0000 mL | Freq: Every day | Status: DC | PRN

## 2016-11-23 MED ORDER — ONDANSETRON HCL 4 MG PO TABS: 4.0000 mg | ORAL_TABLET | Freq: Four times a day (QID) | ORAL | Status: DC | PRN

## 2016-11-23 MED ORDER — BISACODYL 5 MG PO TBEC: 5.0000 mg | DELAYED_RELEASE_TABLET | Freq: Every day | ORAL | Status: DC | PRN

## 2016-11-23 MED ORDER — METOPROLOL SUCCINATE ER 25 MG PO TB24
25.0000 mg | ORAL_TABLET | Freq: Every day | ORAL | Status: DC
  Administered 2016-11-24 – 2016-11-25 (×2): 25 mg via ORAL
  Filled 2016-11-23 (×3): qty 1

## 2016-11-23 MED ORDER — BISACODYL 5 MG PO TBEC: 5.0000 mg | DELAYED_RELEASE_TABLET | Freq: Every day | ORAL | 0 refills | Status: AC | PRN

## 2016-11-23 MED ORDER — LISINOPRIL 10 MG PO TABS
10.0000 mg | ORAL_TABLET | Freq: Every day | ORAL | Status: DC
  Administered 2016-11-24 – 2016-11-25 (×2): 10 mg via ORAL
  Filled 2016-11-23 (×2): qty 1

## 2016-11-23 MED ORDER — ACETAMINOPHEN 325 MG PO TABS
650.0000 mg | ORAL_TABLET | ORAL | Status: DC | PRN
  Administered 2016-12-12: 650 mg via ORAL
  Filled 2016-11-23 (×3): qty 2

## 2016-11-23 MED ORDER — TIZANIDINE HCL 2 MG PO TABS: 2.0000 mg | ORAL_TABLET | Freq: Three times a day (TID) | ORAL | Status: DC | PRN

## 2016-11-23 MED ORDER — POLYETHYLENE GLYCOL 3350 17 G PO PACK
17.0000 g | PACK | Freq: Two times a day (BID) | ORAL | Status: DC
  Administered 2016-11-28: 17 g via ORAL
  Filled 2016-11-23 (×21): qty 1

## 2016-11-23 MED ORDER — ONDANSETRON HCL 4 MG/2ML IJ SOLN: 4.0000 mg | Freq: Four times a day (QID) | INTRAMUSCULAR | Status: DC | PRN

## 2016-11-23 MED ORDER — SENNOSIDES-DOCUSATE SODIUM 8.6-50 MG PO TABS
2.0000 | ORAL_TABLET | Freq: Two times a day (BID) | ORAL | Status: DC
  Administered 2016-11-23 – 2016-12-15 (×22): 2 via ORAL
  Filled 2016-11-23 (×41): qty 2

## 2016-11-23 MED ORDER — ACETAMINOPHEN 650 MG RE SUPP: 650.0000 mg | RECTAL | Status: DC | PRN

## 2016-11-23 MED ORDER — ENOXAPARIN SODIUM 40 MG/0.4ML ~~LOC~~ SOLN
40.0000 mg | SUBCUTANEOUS | Status: DC
  Administered 2016-11-24 – 2016-12-14 (×19): 40 mg via SUBCUTANEOUS
  Filled 2016-11-23 (×21): qty 0.4

## 2016-11-23 NOTE — Care Management Note (Signed)
Case Management Note Marvetta Gibbons RN, BSN Unit 4E-Case Manager [phone]  Patient Details  Name: Eduardo Burch MRN: [id number] Date of Birth: 1940-10-04  Subjective/Objective:   Pt admitted with bilateral LE weakness- unable to ambulate- ?Laqueta Carina syndrome- plan for Plasmapheresis x5 treatments                    Action/Plan: PTA pt lived at home- per PT/OT evals- recommendations for CIR- MD please consider placing CIR consult to assess eligible for CIR- CM will follow.   Expected Discharge Date:  11/23/16               Expected Discharge Plan:  [place]  In-House Referral:  Clinical Social Work  Discharge planning Services  CM Consult  Post Acute Care Choice:  NA Choice offered to:  NA  DME Arranged:    DME Agency:     HH Arranged:    Early Agency:     Status of Service:  Completed, signed off  If discussed at [employer] of Stay Meetings, dates discussed:  9/18, 9/20  Discharge Disposition: IP rehab- CIR   Additional Comments:  11/23/16- 1100- Riccardo Holeman RN, CM- per Pamala Hurry with CIR- pt has bed available and insurance approval for CIR - plan to d/c pt later today.   11/22/16- 1000- Jonathon Tan RN, CM- pt has completed 5 tx of Plasma exchange and will have femoral cath removed this am- PT/OT to work with pt and Marland Mcalpine with CIR to f/u and submit to insurance for approval to admit to CIR possibly tomorrow if receive approval.   Dawayne Patricia, RN 11/23/2016, 11:51 AM

## 2016-11-23 NOTE — Progress Notes (Signed)
Patient ID: Eduardo Burch, male   DOB: 12-15-1940, 76 y.o.   MRN: [id number] Patient admitted to 4W10 via wheelchair, escorted by nursing staff and spouse.  Patient and spouse verbalized understanding of rehab process and expectations.  All questions answered to the patients satisfaction.  Patient appears to be in no immediate distress at this time. Brita Romp, RN

## 2016-11-23 NOTE — Progress Notes (Signed)
I have insurance approval and bed to admit pt to today. I have notified RN CM , Dr. Rodena Piety, pt and his wife. All are in agreement to admit. I will make the arrangements to admit today. [phone]

## 2016-11-23 NOTE — Progress Notes (Signed)
Eduardo Gong, RN Rehab Admission Coordinator Signed Physical Medicine and Rehabilitation  PMR Pre-admission Date of Service: 11/23/2016 10:31 AM  Related encounter: ED to Hosp-Admission (Discharged) from 11/12/2016 in [facility] 4E CV SURGICAL PROGRESSIVE CARE       [] Hide copied text PMR Admission Coordinator Pre-Admission Assessment  Patient: Eduardo Burch is an 76 y.o., male MRN: [id number] DOB: 19-Dec-1940 Height: 6' (182.9 cm) Weight: 67.6 kg (149 lb 0.5 oz)                                                                                                                                                  Insurance Information HMO: yes    PPO:      PCP:      IPA:      80/20:      OTHER: medicare advantage plan PRIMARY: [employer] medicare      Policy#: [id number]      Subscriber: pt CM Name: Eduardo Burch      Phone#: [phone]     Fax#: [phone] Pre-Cert#: [id number]   F/u in 7 days with Eduardo Burch phone [phone] fax [phone]   Employer:  Benefits:  Phone #: [phone]     Name: 11/19/16 Eff. Date: 03/05/16     Deduct: none      Out of Pocket Max: $3750      Life Max: none CIR: $150 co pay per admission      SNF: no co pay days 1-100 days Outpatient: $25 co pay per visit     Co-Pay: visits per medical neccesity Home Health: 100%      Co-Pay: visits per medical neccesity DME: $15 co pay     Co-Pay:  Per each covered benefit Providers: in network  SECONDARY: none       Medicaid Application Date:       Case Manager:  Disability Application Date:       Case Worker:   Emergency Contact Information        Contact Information    Name Relation Home Work Mobile   Hazard Spouse [phone]  [phone]     Current Medical History  Patient Admitting Diagnosis: gait disorder  History of Present Illness:HPI: Eduardo Burch Burch 76 y.o.right handed malewith history of hypertension, prostate cancer with prostatectomy, Paget's disease. Presented 11/12/2016 with  bilateral lower extremity weakness as well as decrease in functional mobility that progressed over the past 4 months. Patient reports having recent fall as well as reported 50 pound weight loss over the last few months.  Patient had an extensive workup over the past few months including MRI, PET scan and EMG studies completed at [facility] followed by neurology services Dr. Posey Burch [facility]. 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 No findings of typical metastatic prostate cancer in the chest abdomen or pelvis.Patient would follow-up neurology services as an outpatient after discharge and if no improvement decision on possible immunosuppression and monthly IVIG versus IV Rituxan.Subcutaneous Lovenox for DVT prophylaxis.   Past Medical History      Past Medical History:  Diagnosis Date  . Constipated   . Elevated blood pressure reading   . 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 Hypercholesterolemia   . Hypertension   . Spondylosis     Family History  family history includes Lung cancer in his mother; Spondylitis in his father.  Prior Rehab/Hospitalizations:  Has the patient had major surgery during 100 days prior to admission? No  Current Medications   Current Facility-Administered Medications:  .  acetaminophen (TYLENOL) tablet 650 mg, 650 mg, Oral, Q4H PRN, 650 mg at 11/19/16 0441 **OR** [DISCONTINUED] acetaminophen (TYLENOL) solution 650 mg, 650 mg, Per Tube, Q4H PRN **OR** acetaminophen (TYLENOL) suppository 650 mg, 650 mg, Rectal, Q4H PRN, Eduardo Burch .  aspirin chewable tablet 81 mg, 81  mg, Oral, Daily, Eduardo Burch, 81 mg at 11/23/16 0934 .  bisacodyl (DULCOLAX) EC tablet 5 mg, 5 mg, Oral, Daily PRN, Eduardo Burch, 5 mg at 11/17/16 1157 .  diphenhydrAMINE (BENADRYL) capsule 25 mg, 25 mg, Oral, Q6H PRN, Eduardo Doom, Burch, 25 mg at 11/21/16 1715 .  enoxaparin (LOVENOX) injection 40 mg, 40 mg, Subcutaneous, Q24H, Eduardo Burch, Stopped at 11/18/16 1000 .  hydrALAZINE (APRESOLINE) injection 10 mg, 10 mg, Intravenous, Q6H PRN, Eduardo Burch .  lisinopril (PRINIVIL,ZESTRIL) tablet 10 mg, 10 mg, Oral, Daily, Eduardo Burch, 10 mg at 11/23/16 0934 .  metoprolol succinate (TOPROL-XL) 24 hr tablet 25 mg, 25 mg, Oral, Daily, Eduardo Burch, 25 mg at 11/23/16 0934 .  polyethylene glycol (MIRALAX / GLYCOLAX) packet 17 g, 17 g, Oral, BID, Eduardo Burch [place], Burch, 17 g at 11/23/16 0934 .  senna-docusate (Senokot-S) tablet 2 tablet, 2 tablet, Oral, BID, Eduardo Burch [place], [place], 2 tablet at 11/23/16 0934 .  tiZANidine (ZANAFLEX) tablet 2 mg, 2 mg, Oral, Q8H PRN, Eduardo Plan Burch, Burch, 2 mg at 11/19/16 0504 .  traMADol (ULTRAM) tablet 50 mg, 50 mg, Oral, Q6H PRN, Eduardo Plan Burch, Burch, 50 mg at 11/21/16 2053  Patients Current Diet: DIET SOFT Room service appropriate? 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 Played tennis, very active.  Home Assistive Devices / Equipment Home Assistive Devices/Equipment: None Home Equipment: Shower seat  Prior Device Use: Indicate devices/aids used by the patient prior to current illness, exacerbation or injury? None of the above  Prior Functional  Level Prior Function Level of Independence: Independent Comments: Patient works part-time at his company (desk job); used to be race Facilities manager Care: Did the patient need help bathing, dressing, using the toilet or eating?  Independent  Indoor Mobility: Did  the patient need assistance with walking from room to room (with or without device)? Independent  Stairs: Did the patient need assistance with internal or external stairs (with or without device)? Independent  Functional Cognition: Did the patient need help planning regular tasks such as shopping or remembering to take medications? Independent  Current Functional Level Cognition  Overall Cognitive Status: Within Functional Limits for tasks assessed Orientation Level: Oriented X4 General Comments: Pt with Burch bit of unrealistic expectations of the speed of his recovery.     Extremity Assessment (includes Sensation/Coordination)  Upper Extremity Assessment: Generalized weakness  Lower Extremity Assessment: Defer to PT evaluation RLE Deficits / Details: ROM WFL, strength grossly 2/5 RLE Coordination: decreased gross motor, decreased fine motor LLE Deficits / Details: ROM WFL, strength grossly 3-/5 LLE Coordination: decreased fine motor, decreased gross motor    ADLs  Overall ADL's : Needs assistance/impaired Eating/Feeding: Modified independent, Sitting Grooming: Wash/dry hands, Wash/dry face, Set up, Sitting Grooming Details (indicate cue type and reason): in recliner Upper Body Bathing: Minimal assistance, Sitting Lower Body Bathing: Moderate assistance, Sitting/lateral leans Upper Body Dressing : Set up, Sitting Lower Body Dressing: Total assistance, Sit to/from stand Lower Body Dressing Details (indicate cue type and reason): to don socks BLE Toilet Transfer: Moderate assistance, +2 for physical assistance, Stand-pivot (using [employer] ) Armed forces technical officer Details (indicate cue type and reason): simulated through  recliner transfer Toileting- Clothing Manipulation and Hygiene: Maximal assistance, Sit to/from stand Functional mobility during ADLs: Moderate assistance, +2 for physical assistance General ADL Comments: Session focused on bed level B UE strengthening exercises only as pt with temporary femoral HD catheter in R LE and R hip flexion is contraindicated. Pt fatigues easily with exercises and noted B UE tremoring as activity progressed.     Mobility  Overal bed mobility: Needs Assistance Bed Mobility: Supine to Sit Supine to sit: +2 for physical assistance, Mod assist General bed mobility comments: Two person mod hand held assist to pull to sitting EOB verbal cues for hand placement.     Transfers  Overall transfer level: Needs assistance Equipment used: Rolling walker (2 wheeled) Transfer via Lift Equipment: Stedy Transfers: Sit to/from Stand, Risk manager Sit to Stand: +2 physical assistance, Mod assist, From elevated surface Stand pivot transfers: +2 safety/equipment, From elevated surface (with stedy) Squat pivot transfers: Max assist, +2 physical assistance, From elevated surface General transfer comment: Two person mod assist to stand from elevated bed with bil knees blocked and RW stabilized by PT/OT.  Verbal cues for safe hand placement, forward motion to go up and extension at knees and hips at terminal stand. Mod assist at trunk to power up. Transferred with stedy standing frame for safe transfer to the chair.      Ambulation / Gait / Stairs / Wheelchair Mobility  Ambulation/Gait General Gait Details: unable at this time.    Posture / Balance Dynamic Sitting Balance Sitting balance - Comments: Once squared up and feet stable on ground supervision for EOB.  Not specifically challenged Balance Overall balance assessment: Needs assistance Sitting-balance support: Feet supported, Bilateral upper extremity supported, Single extremity supported, No upper extremity  supported Sitting balance-Leahy Scale: Fair Sitting balance - Comments: Once squared up and feet stable on ground supervision for EOB.  Not specifically challenged Standing balance support: Bilateral upper extremity supported Standing balance-Leahy Scale: Poor Standing balance comment: Mod assist with RW and two people in standing.     Special needs/care consideration BiPAP/CPAP  N/Burch CPM  N/Burch Continuous Drip  IV  N/Burch Dialysis  N/Burch Life Vest  N/Burch Oxygen  N/Burch Special Bed  N/Burch Trach Size  N/Burch Wound Vac  N/Burch Skin  Right groin dressing intact where right femoral dialysis catheter for PLEX removed 11/22/16 Bowel mgmt:  Continent LBM 11/21/16 Bladder mgmt: continent Diabetic mgmt  N/Burch   Previous Home Environment Living Arrangements: Spouse/significant other  Lives With: Spouse Available Help at Discharge:  (wif eassists with running business; would have ot hire asiss) Type of Home: House Home Layout: Two level, Able to live on main level with bedroom/bathroom Alternate Level Stairs-Number of Steps: flight Home Access: Stairs to enter Entrance Stairs-Rails: Right, Left Entrance Stairs-Number of Steps:  (pt and wife state they could have ramp built if needed) Bathroom Shower/Tub: Multimedia programmer: Handicapped height Bathroom Accessibility: Yes How Accessible: Accessible via wheelchair [place]: Other (Comment)  Discharge Living Setting Plans for Discharge Living Setting: Patient's home, Lives with (comment) (wife) Type of Home at Discharge: House Discharge Home Layout: Able to live on main level with bedroom/bathroom, Two level, 1/2 bath on main level Alternate Level Stairs-Number of Steps: flight Discharge Home Access: Stairs to enter Entrance Stairs-Rails: Right, Left Entrance Stairs-Number of Steps: 5 (couple report that they could have ramp built) Discharge Bathroom Shower/Tub: Walk-in shower Discharge Bathroom Toilet: Handicapped height Discharge  Bathroom Accessibility: Yes How Accessible: Accessible via walker Does the patient have any problems obtaining your medications?: No  Social/Family/Support Systems Patient Roles: Spouse, Other (Comment) (business owner; Human resources officer company) Sport and exercise psychologist Information: wife, Nature conservation officer Anticipated Caregiver: Arville Go; may have to hire assist if needed Anticipated Caregiver's Contact Information: see above Ability/Limitations of Caregiver: wife assists in running their buisness.  (wife and pt have high expectations for recovery process) Caregiver Availability: Other (Comment) (wife states 1/2 day needed to spend at [facility]) Discharge Plan Discussed with Primary Caregiver: Yes Is Caregiver In Agreement with Plan?: Yes Does Caregiver/Family have Issues with Lodging/Transportation while Pt is in Rehab?: No  Goals/Additional Needs Patient/Family Goal for Rehab: supervision with PT and OT Expected length of stay: ELOS 14- 18 days (wife unrealistic with LOS; states he will stay as long as ne) Pt/Family Agrees to Admission and willing to participate: Yes Program Orientation Provided & Reviewed with Pt/Caregiver Including Roles  & Responsibilities: Yes  Barriers to Discharge: Decreased caregiver support (pt and wife have high expectations for his recovery)  Barriers to Discharge Comments: wife called his insurance company and they told her he has unlimited days for inpt rehab  Decrease burden of Care through IP rehab admission: n/Burch  Possible need for SNF placement upon discharge: not anticipated  Patient Condition: This patient's medical and functional status has changed since the consult dated: 11/15/2016 in which the Rehabilitation Physician determined and documented that the patient's condition is appropriate for intensive rehabilitative care in an inpatient rehabilitation facility. See "History of Present Illness" (above) for medical update. Functional changes are: mod assist. Patient's  medical and functional status update has been discussed with the Rehabilitation physician and patient remains appropriate for inpatient rehabilitation. Will admit to inpatient rehab today.  Preadmission Screen Completed By:  Cleatrice Burke, 11/23/2016 10:31 AM ______________________________________________________________________   Discussed status with Dr. Naaman Plummer on 11/23/2016 at  1041 and received telephone approval for admission today.  Admission Coordinator:  Cleatrice Burke, time 1829 Date 11/23/2016       Cosigned by: Meredith Staggers, Burch at 11/23/2016 11:15 AM  Revision History

## 2016-11-23 NOTE — Progress Notes (Signed)
Eduardo Gong, RN Rehab Admission Coordinator Signed Physical Medicine and Rehabilitation  PMR Pre-admission Date of Service: 11/23/2016 10:31 AM  Related encounter: ED to Hosp-Admission (Discharged) from 11/12/2016 in [facility] 4E CV SURGICAL PROGRESSIVE CARE       [] Hide copied text PMR Admission Coordinator Pre-Admission Assessment  Patient: Eduardo Burch is an 76 y.o., male MRN: [id number] DOB: Jan 10, 1941 Height: 6' (182.9 cm) Weight: 67.6 kg (149 lb 0.5 oz)                                                                                                                                                  Insurance Information HMO: yes    PPO:      PCP:      IPA:      80/20:      OTHER: medicare advantage plan PRIMARY: [employer] medicare      Policy#: [id number]      Subscriber: pt CM Name: Orvan July      Phone#: [phone]     Fax#: [phone] Pre-Cert#: [id number]   F/u in 7 days with Vevelyn Royals phone [phone] fax [phone]   Employer:  Benefits:  Phone #: [phone]     Name: 11/19/16 Eff. Date: 03/05/16     Deduct: none      Out of Pocket Max: $3750      Life Max: none CIR: $150 co pay per admission      SNF: no co pay days 1-100 days Outpatient: $25 co pay per visit     Co-Pay: visits per medical neccesity Home Health: 100%      Co-Pay: visits per medical neccesity DME: $15 co pay     Co-Pay:  Per each covered benefit Providers: in network  SECONDARY: none       Medicaid Application Date:       Case Manager:  Disability Application Date:       Case Worker:   Emergency Contact Information        Contact Information    Name Relation Home Work Mobile   [place] Spouse [phone]  [phone]     Current Medical History  Patient Admitting Diagnosis: gait disorder  History of Present Illness:HPI: Eduardo Burch a 76 y.o.right handed malewith history of hypertension, prostate cancer with prostatectomy, Paget's disease. Presented 11/12/2016 with  bilateral lower extremity weakness as well as decrease in functional mobility that progressed over the past 4 months. Patient reports having recent fall as well as reported 50 pound weight loss over the last few months.  Patient had an extensive workup over the past few months including MRI, PET scan and EMG studies completed at [facility] followed by neurology services Dr. Posey Pronto [facility]rology[facility] well [facility]. 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 No findings of typical metastatic prostate cancer in the chest abdomen or pelvis.Patient would follow-up neurology services as an outpatient after discharge and if no improvement decision on possible immunosuppression and monthly IVIG versus IV Rituxan.Subcutaneous Lovenox for DVT prophylaxis.   Past Medical History      Past Medical History:  Diagnosis Date  . Constipated   . Elevated blood pressure reading   . 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 Hypercholesterolemia   . Hypertension   . Spondylosis     Family History  family history includes Lung cancer in his mother; Spondylitis in his father.  Prior Rehab/Hospitalizations:  Has the patient had major surgery during 100 days prior to admission? No  Current Medications   Current Facility-Administered Medications:  .  acetaminophen (TYLENOL) tablet 650 mg, 650 mg, Oral, Q4H PRN, 650 mg at 11/19/16 0441 **OR** [DISCONTINUED] acetaminophen (TYLENOL) solution 650 mg, 650 mg, Per Tube, Q4H PRN **OR** acetaminophen (TYLENOL) suppository 650 mg, 650 mg, Rectal, Q4H PRN, Smith, Rondell A, MD .  aspirin chewable tablet 81 mg, 81  mg, Oral, Daily, Tamala Julian, Rondell A, MD, 81 mg at 11/23/16 0934 .  bisacodyl (DULCOLAX) EC tablet 5 mg, 5 mg, Oral, Daily PRN, Blount, Xenia T, NP, 5 mg at 11/17/16 1157 .  diphenhydrAMINE (BENADRYL) capsule 25 mg, 25 mg, Oral, Q6H PRN, Greta Doom, MD, 25 mg at 11/21/16 1715 .  enoxaparin (LOVENOX) injection 40 mg, 40 mg, Subcutaneous, Q24H, Smith, Rondell A, MD, Stopped at 11/18/16 1000 .  hydrALAZINE (APRESOLINE) injection 10 mg, 10 mg, Intravenous, Q6H PRN, Sheikh, Omair Latif, DO .  lisinopril (PRINIVIL,ZESTRIL) tablet 10 mg, 10 mg, Oral, Daily, Tamala Julian, Rondell A, MD, 10 mg at 11/23/16 0934 .  metoprolol succinate (TOPROL-XL) 24 hr tablet 25 mg, 25 mg, Oral, Daily, Tamala Julian, Rondell A, MD, 25 mg at 11/23/16 0934 .  polyethylene glycol (MIRALAX / GLYCOLAX) packet 17 g, 17 g, Oral, BID, Raiford Noble [place], DO, 17 g at 11/23/16 0934 .  senna-docusate (Senokot-S) tablet 2 tablet, 2 tablet, Oral, BID, Raiford Noble [place], [place], 2 tablet at 11/23/16 0934 .  tiZANidine (ZANAFLEX) tablet 2 mg, 2 mg, Oral, Q8H PRN, Fuller Plan A, MD, 2 mg at 11/19/16 0504 .  traMADol (ULTRAM) tablet 50 mg, 50 mg, Oral, Q6H PRN, Fuller Plan A, MD, 50 mg at 11/21/16 2053  Patients Current Diet: DIET SOFT Room service appropriate? 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 Played tennis, very active.  Home Assistive Devices / Equipment Home Assistive Devices/Equipment: None Home Equipment: Shower seat  Prior Device Use: Indicate devices/aids used by the patient prior to current illness, exacerbation or injury? None of the above  Prior Functional  Level Prior Function Level of Independence: Independent Comments: Patient works part-time at his company (desk job); used to be race Facilities manager Care: Did the patient need help bathing, dressing, using the toilet or eating?  Independent  Indoor Mobility: Did  the patient need assistance with walking from room to room (with or without device)? Independent  Stairs: Did the patient need assistance with internal or external stairs (with or without device)? Independent  Functional Cognition: Did the patient need help planning regular tasks such as shopping or remembering to take medications? Independent  Current Functional Level Cognition  Overall Cognitive Status: Within Functional Limits for tasks assessed Orientation Level: Oriented X4 General Comments: Pt with a bit of unrealistic expectations of the speed of his recovery.     Extremity Assessment (includes Sensation/Coordination)  Upper Extremity Assessment: Generalized weakness  Lower Extremity Assessment: Defer to PT evaluation RLE Deficits / Details: ROM WFL, strength grossly 2/5 RLE Coordination: decreased gross motor, decreased fine motor LLE Deficits / Details: ROM WFL, strength grossly 3-/5 LLE Coordination: decreased fine motor, decreased gross motor    ADLs  Overall ADL's : Needs assistance/impaired Eating/Feeding: Modified independent, Sitting Grooming: Wash/dry hands, Wash/dry face, Set up, Sitting Grooming Details (indicate cue type and reason): in recliner Upper Body Bathing: Minimal assistance, Sitting Lower Body Bathing: Moderate assistance, Sitting/lateral leans Upper Body Dressing : Set up, Sitting Lower Body Dressing: Total assistance, Sit to/from stand Lower Body Dressing Details (indicate cue type and reason): to don socks BLE Toilet Transfer: Moderate assistance, +2 for physical assistance, Stand-pivot (using [employer] ) Armed forces technical officer Details (indicate cue type and reason): simulated through  recliner transfer Toileting- Clothing Manipulation and Hygiene: Maximal assistance, Sit to/from stand Functional mobility during ADLs: Moderate assistance, +2 for physical assistance General ADL Comments: Session focused on bed level B UE strengthening exercises only as pt with temporary femoral HD catheter in R LE and R hip flexion is contraindicated. Pt fatigues easily with exercises and noted B UE tremoring as activity progressed.     Mobility  Overal bed mobility: Needs Assistance Bed Mobility: Supine to Sit Supine to sit: +2 for physical assistance, Mod assist General bed mobility comments: Two person mod hand held assist to pull to sitting EOB verbal cues for hand placement.     Transfers  Overall transfer level: Needs assistance Equipment used: Rolling walker (2 wheeled) Transfer via Lift Equipment: Stedy Transfers: Sit to/from Stand, Risk manager Sit to Stand: +2 physical assistance, Mod assist, From elevated surface Stand pivot transfers: +2 safety/equipment, From elevated surface (with stedy) Squat pivot transfers: Max assist, +2 physical assistance, From elevated surface General transfer comment: Two person mod assist to stand from elevated bed with bil knees blocked and RW stabilized by PT/OT.  Verbal cues for safe hand placement, forward motion to go up and extension at knees and hips at terminal stand. Mod assist at trunk to power up. Transferred with stedy standing frame for safe transfer to the chair.      Ambulation / Gait / Stairs / Wheelchair Mobility  Ambulation/Gait General Gait Details: unable at this time.    Posture / Balance Dynamic Sitting Balance Sitting balance - Comments: Once squared up and feet stable on ground supervision for EOB.  Not specifically challenged Balance Overall balance assessment: Needs assistance Sitting-balance support: Feet supported, Bilateral upper extremity supported, Single extremity supported, No upper extremity  supported Sitting balance-Leahy Scale: Fair Sitting balance - Comments: Once squared up and feet stable on ground supervision for EOB.  Not specifically challenged Standing balance support: Bilateral upper extremity supported Standing balance-Leahy Scale: Poor Standing balance comment: Mod assist with RW and two people in standing.     Special needs/care consideration BiPAP/CPAP  N/a CPM  N/a Continuous Drip  IV  N/a Dialysis  N/a Life Vest  N/a Oxygen  N/a Special Bed  N/a Trach Size  N/a Wound Vac  N/a Skin  Right groin dressing intact where right femoral dialysis catheter for PLEX removed 11/22/16 Bowel mgmt:  Continent LBM 11/21/16 Bladder mgmt: continent Diabetic mgmt  N/a   Previous Home Environment Living Arrangements: Spouse/significant other  Lives With: Spouse Available Help at Discharge:  (wif eassists with running business; would have ot hire asiss) Type of Home: House Home Layout: Two level, Able to live on main level with bedroom/bathroom Alternate Level Stairs-Number of Steps: flight Home Access: Stairs to enter Entrance Stairs-Rails: Right, Left Entrance Stairs-Number of Steps:  (pt and wife state they could have ramp built if needed) Bathroom Shower/Tub: Multimedia programmer: Handicapped height Bathroom Accessibility: Yes How Accessible: Accessible via wheelchair Cassel: Other (Comment)  Discharge Living Setting Plans for Discharge Living Setting: Patient's home, Lives with (comment) (wife) Type of Home at Discharge: House Discharge Home Layout: Able to live on main level with bedroom/bathroom, Two level, 1/2 bath on main level Alternate Level Stairs-Number of Steps: flight Discharge Home Access: Stairs to enter Entrance Stairs-Rails: Right, Left Entrance Stairs-Number of Steps: 5 (couple report that they could have ramp built) Discharge Bathroom Shower/Tub: Walk-in shower Discharge Bathroom Toilet: Handicapped height Discharge  Bathroom Accessibility: Yes How Accessible: Accessible via walker Does the patient have any problems obtaining your medications?: No  Social/Family/Support Systems Patient Roles: Spouse, Other (Comment) (business owner; Human resources officer company) Sport and exercise psychologist Information: wife, Nature conservation officer Anticipated Caregiver: Arville Go; may have to hire assist if needed Anticipated Caregiver's Contact Information: see above Ability/Limitations of Caregiver: wife assists in running their buisness.  (wife and pt have high expectations for recovery process) Caregiver Availability: Other (Comment) (wife states 1/2 day needed to spend at [facility]) Discharge Plan Discussed with Primary Caregiver: Yes Is Caregiver In Agreement with Plan?: Yes Does Caregiver/Family have Issues with Lodging/Transportation while Pt is in Rehab?: No  Goals/Additional Needs Patient/Family Goal for Rehab: supervision with PT and OT Expected length of stay: ELOS 14- 18 days (wife unrealistic with LOS; states he will stay as long as ne) Pt/Family Agrees to Admission and willing to participate: Yes Program Orientation Provided & Reviewed with Pt/Caregiver Including Roles  & Responsibilities: Yes  Barriers to Discharge: Decreased caregiver support (pt and wife have high expectations for his recovery)  Barriers to Discharge Comments: wife called his insurance company and they told her he has unlimited days for inpt rehab  Decrease burden of Care through IP rehab admission: n/a  Possible need for SNF placement upon discharge: not anticipated  Patient Condition: This patient's medical and functional status has changed since the consult dated: 11/15/2016 in which the Rehabilitation Physician determined and documented that the patient's condition is appropriate for intensive rehabilitative care in an inpatient rehabilitation facility. See "History of Present Illness" (above) for medical update. Functional changes are: mod assist. Patient's  medical and functional status update has been discussed with the Rehabilitation physician and patient remains appropriate for inpatient rehabilitation. Will admit to inpatient rehab today.  Preadmission Screen Completed By:  Cleatrice Burke, 11/23/2016 10:31 AM ______________________________________________________________________   Discussed status with Dr. Naaman Plummer on 11/23/2016 at  1041 and received telephone approval for admission today.  Admission Coordinator:  Cleatrice Burke, time 4970 Date 11/23/2016       Cosigned by: Meredith Staggers, MD at 11/23/2016 11:15 AM  Revision History

## 2016-11-23 NOTE — PMR Pre-admission (Signed)
PMR Admission Coordinator Pre-Admission Assessment  Patient: Eduardo Burch is an 76 y.o., male MRN: [id number] DOB: February 10, 1941 Height: 6' (182.9 cm) Weight: 67.6 kg (149 lb 0.5 oz)              Insurance Information HMO: yes    PPO:      PCP:      IPA:      80/20:      OTHER: medicare advantage plan PRIMARY: [employer] medicare      Policy#: [id number]      Subscriber: pt CM Name: Orvan July      Phone#: [phone]     Fax#: [phone] Pre-Cert#: [id number]   F/u in 7 days with Vevelyn Royals phone [phone] fax [phone]   Employer:  Benefits:  Phone #: [phone]     Name: 11/19/16 Eff. Date: 03/05/16     Deduct: none      Out of Pocket Max: $3750      Life Max: none CIR: $150 co pay per admission      SNF: no co pay days 1-100 days Outpatient: $25 co pay per visit     Co-Pay: visits per medical neccesity Home Health: 100%      Co-Pay: visits per medical neccesity DME: $15 co pay     Co-Pay:  Per each covered benefit Providers: in network  SECONDARY: none       Medicaid Application Date:       Case Manager:  Disability Application Date:       Case Worker:   Emergency Contact Information Contact Information    Name Relation Home Work Mobile   [place] Spouse [phone]  [phone]     Current Medical History  Patient Admitting Diagnosis: gait disorder  History of Present Illness:HPI: Eduardo Burch a 76 y.o.right handed malewith history of hypertension, prostate cancer with prostatectomy, Paget's disease. Presented 11/12/2016 with bilateral lower extremity weakness as well as decrease in functional mobility that progressed over the past 4 months. Patient reports having recent fall as well as reported 50 pound weight loss over the last few months.  Patient had an extensive workup over the past few months including MRI, PET scan and EMG studies completed at [facility] followed by neurology services Dr. Thresa Ross neurology as well at [facility]. He  was initially thought to have GBS previously tried on IVIG. 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 CT of abdomen and chest as well as pelvis revealed potential area of circumferential colonic wall thickening near the rectosigmoid junction. No findings of typical metastatic prostate cancer in the chest abdomen or pelvis. Patient would follow-up neurology services as an outpatient after discharge and if no improvement decision on possible immunosuppression and monthly IVIG versus IV Rituxan. Subcutaneous Lovenox for DVT prophylaxis.   Past Medical History  Past Medical History:  Diagnosis Date  . Constipated   . Elevated blood pressure reading   . 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 Hypercholesterolemia   . Hypertension   . Spondylosis     Family History  family history includes Lung cancer in his mother; Spondylitis in his father.  Prior Rehab/Hospitalizations:  Has the patient had major surgery during 100 days prior to admission? No  Current Medications   Current Facility-Administered Medications:  .  acetaminophen (TYLENOL) tablet 650 mg, 650 mg, Oral, Q4H PRN, 650 mg at  11/19/16 0441 **OR** [DISCONTINUED] acetaminophen (TYLENOL) solution 650 mg, 650 mg, Per Tube, Q4H PRN **OR** acetaminophen (TYLENOL) suppository 650 mg, 650 mg, Rectal, Q4H PRN, Smith, Rondell A, MD .  aspirin chewable tablet 81 mg, 81 mg, Oral, Daily, Tamala Julian, Rondell A, MD, 81 mg at 11/23/16 0934 .  bisacodyl (DULCOLAX) EC tablet 5 mg, 5 mg, Oral, Daily PRN, Blount, Xenia T, NP, 5 mg at 11/17/16 1157 .  diphenhydrAMINE (BENADRYL) capsule 25 mg, 25 mg, Oral, Q6H PRN, Greta Doom, MD, 25 mg at 11/21/16 1715 .  enoxaparin (LOVENOX) injection 40 mg, 40 mg, Subcutaneous, Q24H, Smith, Rondell A, MD, Stopped at 11/18/16 1000 .  hydrALAZINE (APRESOLINE) injection 10 mg, 10  mg, Intravenous, Q6H PRN, Sheikh, Omair Latif, DO .  lisinopril (PRINIVIL,ZESTRIL) tablet 10 mg, 10 mg, Oral, Daily, Tamala Julian, Rondell A, MD, 10 mg at 11/23/16 0934 .  metoprolol succinate (TOPROL-XL) 24 hr tablet 25 mg, 25 mg, Oral, Daily, Tamala Julian, Rondell A, MD, 25 mg at 11/23/16 0934 .  polyethylene glycol (MIRALAX / GLYCOLAX) packet 17 g, 17 g, Oral, BID, Raiford Noble [place], DO, 17 g at 11/23/16 0934 .  senna-docusate (Senokot-S) tablet 2 tablet, 2 tablet, Oral, BID, Raiford Noble [place], [place], 2 tablet at 11/23/16 0934 .  tiZANidine (ZANAFLEX) tablet 2 mg, 2 mg, Oral, Q8H PRN, Fuller Plan A, MD, 2 mg at 11/19/16 0504 .  traMADol (ULTRAM) tablet 50 mg, 50 mg, Oral, Q6H PRN, Fuller Plan A, MD, 50 mg at 11/21/16 2053  Patients Current Diet: DIET SOFT Room service appropriate? 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 Played tennis, very active.  Home Assistive Devices / Equipment Home Assistive Devices/Equipment: None Home Equipment: Shower seat  Prior Device Use: Indicate devices/aids used by the patient prior to current illness, exacerbation or injury? None of the above  Prior Functional Level Prior Function Level of Independence: Independent Comments: Patient works part-time at his company (desk job); used to be race Facilities manager Care: Did the patient need help bathing, dressing, using the toilet or eating?  Independent  Indoor Mobility: Did the patient need assistance with walking from room to room (with or without device)? Independent  Stairs: Did the patient need assistance with internal or external stairs (with or without  device)? Independent  Functional Cognition: Did the patient need help planning regular tasks such as shopping or remembering to take medications? Independent  Current Functional Level Cognition  Overall Cognitive Status: Within Functional Limits for tasks assessed Orientation Level: Oriented X4 General Comments: Pt with a bit of unrealistic expectations of the speed of his recovery.     Extremity Assessment (includes Sensation/Coordination)  Upper Extremity Assessment: Generalized weakness  Lower Extremity Assessment: Defer to PT evaluation RLE Deficits / Details: ROM WFL, strength grossly 2/5 RLE Coordination: decreased gross motor, decreased fine motor LLE Deficits / Details: ROM WFL, strength grossly 3-/5 LLE Coordination: decreased fine motor, decreased gross motor    ADLs  Overall ADL's : Needs assistance/impaired Eating/Feeding: Modified independent, Sitting Grooming: Wash/dry hands, Wash/dry face, Set up, Sitting Grooming Details (indicate cue type and reason): in recliner Upper Body Bathing: Minimal assistance, Sitting Lower Body Bathing: Moderate assistance,  Sitting/lateral leans Upper Body Dressing : Set up, Sitting Lower Body Dressing: Total assistance, Sit to/from stand Lower Body Dressing Details (indicate cue type and reason): to don socks BLE Toilet Transfer: Moderate assistance, +2 for physical assistance, Stand-pivot (using [employer] ) Armed forces technical officer Details (indicate cue type and reason): simulated through recliner transfer Toileting- Clothing Manipulation and Hygiene: Maximal assistance, Sit to/from stand Functional mobility during ADLs: Moderate assistance, +2 for physical assistance General ADL Comments: Session focused on bed level B UE strengthening exercises only as pt with temporary femoral HD catheter in R LE and R hip flexion is contraindicated. Pt fatigues easily with exercises and noted B UE tremoring as activity progressed.     Mobility  Overal bed  mobility: Needs Assistance Bed Mobility: Supine to Sit Supine to sit: +2 for physical assistance, Mod assist General bed mobility comments: Two person mod hand held assist to pull to sitting EOB verbal cues for hand placement.     Transfers  Overall transfer level: Needs assistance Equipment used: Rolling walker (2 wheeled) Transfer via Lift Equipment: Stedy Transfers: Sit to/from Stand, Risk manager Sit to Stand: +2 physical assistance, Mod assist, From elevated surface Stand pivot transfers: +2 safety/equipment, From elevated surface (with stedy) Squat pivot transfers: Max assist, +2 physical assistance, From elevated surface General transfer comment: Two person mod assist to stand from elevated bed with bil knees blocked and RW stabilized by PT/OT.  Verbal cues for safe hand placement, forward motion to go up and extension at knees and hips at terminal stand. Mod assist at trunk to power up. Transferred with stedy standing frame for safe transfer to the chair.      Ambulation / Gait / Stairs / Wheelchair Mobility  Ambulation/Gait General Gait Details: unable at this time.    Posture / Balance Dynamic Sitting Balance Sitting balance - Comments: Once squared up and feet stable on ground supervision for EOB.  Not specifically challenged Balance Overall balance assessment: Needs assistance Sitting-balance support: Feet supported, Bilateral upper extremity supported, Single extremity supported, No upper extremity supported Sitting balance-Leahy Scale: Fair Sitting balance - Comments: Once squared up and feet stable on ground supervision for EOB.  Not specifically challenged Standing balance support: Bilateral upper extremity supported Standing balance-Leahy Scale: Poor Standing balance comment: Mod assist with RW and two people in standing.     Special needs/care consideration BiPAP/CPAP  N/a CPM  N/a Continuous Drip IV  N/a Dialysis  N/a Life Vest  N/a Oxygen  N/a Special  Bed  N/a Trach Size  N/a Wound Vac  N/a Skin  Right groin dressing intact where right femoral dialysis catheter for PLEX removed 11/22/16 Bowel mgmt:  Continent LBM 11/21/16 Bladder mgmt: continent Diabetic mgmt  N/a   Previous Home Environment Living Arrangements: Spouse/significant other  Lives With: Spouse Available Help at Discharge:  (wif eassists with running business; would have ot hire asiss) Type of Home: House Home Layout: Two level, Able to live on main level with bedroom/bathroom Alternate Level Stairs-Number of Steps: flight Home Access: Stairs to enter Entrance Stairs-Rails: Right, Left Entrance Stairs-Number of Steps:  (pt and wife state they could have ramp built if needed) Bathroom Shower/Tub: Multimedia programmer: Handicapped height Bathroom Accessibility: Yes How Accessible: Accessible via wheelchair [place]: Other (Comment)  Discharge Living Setting Plans for Discharge Living Setting: Patient's home, Lives with (comment) (wife) Type of Home at Discharge: House Discharge Home Layout: Able to live on main level with bedroom/bathroom, Two level, 1/2  bath on main level Alternate Level Stairs-Number of Steps: flight Discharge Home Access: Stairs to enter Entrance Stairs-Rails: Right, Left Entrance Stairs-Number of Steps: 5 (couple report that they could have ramp built) Discharge Bathroom Shower/Tub: Walk-in shower Discharge Bathroom Toilet: Handicapped height Discharge Bathroom Accessibility: Yes How Accessible: Accessible via walker Does the patient have any problems obtaining your medications?: No  Social/Family/Support Systems Patient Roles: Spouse, Other (Comment) (business owner; Human resources officer company) Sport and exercise psychologist Information: wife, Nature conservation officer Anticipated Caregiver: Arville Go; may have to hire assist if needed Anticipated Caregiver's Contact Information: see above Ability/Limitations of Caregiver: wife assists in running their buisness.   (wife and pt have high expectations for recovery process) Caregiver Availability: Other (Comment) (wife states 1/2 day needed to spend at [facility]) Discharge Plan Discussed with Primary Caregiver: Yes Is Caregiver In Agreement with Plan?: Yes Does Caregiver/Family have Issues with Lodging/Transportation while Pt is in Rehab?: No  Goals/Additional Needs Patient/Family Goal for Rehab: supervision with PT and OT Expected length of stay: ELOS 14- 18 days (wife unrealistic with LOS; states he will stay as long as ne) Pt/Family Agrees to Admission and willing to participate: Yes Program Orientation Provided & Reviewed with Pt/Caregiver Including Roles  & Responsibilities: Yes  Barriers to Discharge: Decreased caregiver support (pt and wife have high expectations for his recovery)  Barriers to Discharge Comments: wife called his insurance company and they told her he has unlimited days for inpt rehab  Decrease burden of Care through IP rehab admission: n/a  Possible need for SNF placement upon discharge: not anticipated  Patient Condition: This patient's medical and functional status has changed since the consult dated: 11/15/2016 in which the Rehabilitation Physician determined and documented that the patient's condition is appropriate for intensive rehabilitative care in an inpatient rehabilitation facility. See "History of Present Illness" (above) for medical update. Functional changes are: mod assist. Patient's medical and functional status update has been discussed with the Rehabilitation physician and patient remains appropriate for inpatient rehabilitation. Will admit to inpatient rehab today.  Preadmission Screen Completed By:  Cleatrice Burke, 11/23/2016 10:31 AM ______________________________________________________________________   Discussed status with Dr. Naaman Plummer on 11/23/2016 at  1041 and received telephone approval for admission today.  Admission Coordinator:  Cleatrice Burke, time [id number] Date 11/23/2016

## 2016-11-23 NOTE — Progress Notes (Signed)
Report given to 4W [employer], all questions answered. Pt transferred to 4W10 with all belongings. RN Multimedia programmer and pt's wife at bedside at time of transfer.  Fritz Pickerel, RN

## 2016-11-23 NOTE — H&P (Signed)
Physical Medicine and Rehabilitation Admission H&P       Chief Complaint  Patient presents with  . Extremity Weakness  : HPI: Eduardo Burch a 76 y.o.right handed malewith history of hypertension, prostate cancer with prostatectomy, Paget's disease. Presented 11/12/2016 with bilateral lower extremity weakness as well as decrease in functional mobility that progressed over the past 4 months. Patient reports having recent fall as well as reported 50 pound weight loss over the last few months. 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 Two-level home with bedroom Main floor. Patient had had an extensive workup over the past few months including MRI, PET scan and EMG studies completed at [facility] followed by neurology services Dr. Thresa Ross neurology as well at [facility]. 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 CT of abdomen and chest as well as pelvis revealed potential area of circumferential colonic wall thickening near the rectosigmoid junction. No findings of typical metastatic prostate cancer in the chest abdomen or pelvis. Patient would follow-up neurology services as an outpatient after discharge and if no improvement decision on possible immunosuppression and monthly IVIG versus IV Rituxan. Subcutaneous Lovenox for DVT prophylaxis. Physical occupational therapy evaluation completed 11/14/2016 with recommendations of physical medicine rehabilitation consult.Patient was admitted for a comprehensive rehabilitation program.  Review of Systems  Constitutional: Negative for chills and fever.  HENT: Negative for hearing loss and tinnitus.   Eyes: Negative for blurred vision and double vision.  Respiratory: Negative for cough and shortness of breath.     Cardiovascular: Negative for chest pain, palpitations and leg swelling.  Gastrointestinal: Positive for constipation. Negative for nausea and vomiting.  Genitourinary: Positive for urgency.  Musculoskeletal: Positive for back pain and myalgias.  Skin: Negative for rash.  Neurological: Positive for sensory change and weakness. Negative for seizures.  All other systems reviewed and are negative.      Past Medical History:  Diagnosis Date  . Constipated   . Elevated blood pressure reading   . 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 Hypercholesterolemia   . Hypertension   . Spondylosis         Past Surgical History:  Procedure Laterality Date  . PROATATECTOMY          Family History  Problem Relation Age of Onset  . Lung cancer Mother   . Spondylitis Father    Social History:  reports that he has quit smoking. His smoking use included Cigarettes. He has never used smokeless tobacco. He reports that he does not drink alcohol or use drugs. Allergies:      Allergies  Allergen Reactions  . Remicade [Infliximab] Anaphylaxis         Medications Prior to Admission  Medication Sig Dispense Refill  . acetaminophen (TYLENOL) 500 MG tablet Take 500-1,000 mg by mouth every 6 (six) hours as needed for headache.    Marland Kitchen aspirin 81 MG chewable tablet Chew 1 tablet (81 mg total) by mouth daily. 30 tablet 0  . docusate sodium (COLACE) 100 MG capsule Take 1 capsule (100 mg total) by mouth every 12 (twelve) hours. 60 capsule 0  . gabapentin (NEURONTIN) 300 MG capsule Take 300 mg by mouth at bedtime.    Marland Kitchen lisinopril (PRINIVIL,ZESTRIL) 10 MG tablet Take 1 tablet (  10 mg total) by mouth daily. 90 tablet 3  . metoprolol succinate (TOPROL XL) 25 MG 24 hr tablet Take 1 tablet (25 mg total) by mouth daily. 90 tablet 3  . tiZANidine (ZANAFLEX) 2 MG tablet Take 2 mg by mouth 3 (three) times daily.  5  . traMADol (ULTRAM) 50 MG tablet Take 1 tablet (50 mg total)  by mouth every 6 (six) hours as needed for moderate pain or severe pain. 30 tablet 0  . UNABLE TO FIND OUTPATIENT PHYSICAL THERAPY AND OCCUPATIONAL THERAPY  Dx: Guillain-Barre  Evaluation and Treat 1 each 0    Drug Regimen Review  Drug regimen was reviewed and remains appropriate with no significant issues identified  Home: Home Living Family/patient expects to be discharged to:: Private residence Living Arrangements: Spouse/significant other Available Help at Discharge: Family, Available 24 hours/day Type of Home: House Home Access: Stairs to enter [employer] of Steps: 5 Entrance Stairs-Rails: Right, Left Home Layout: Two level, Able to live on main level with bedroom/bathroom Alternate Level Stairs-Number of Steps: flight Bathroom Shower/Tub: Multimedia programmer: Handicapped height Bathroom Accessibility: Yes Home Equipment: Industrial/product designer History: Prior Function Level of Independence: Independent Comments: Patient works part-time at his company (desk job); used to be race Advertising account executive Status:  Mobility: Bed Mobility Overal bed mobility: Needs Assistance Bed Mobility: Supine to Sit Supine to sit: +2 for physical assistance, Mod assist General bed mobility comments: Two person mod hand held assist to pull to sitting EOB verbal cues for hand placement.  Transfers Overall transfer level: Needs assistance Equipment used: Rolling walker (2 wheeled) Transfer via Lift Equipment: Stedy Transfers: Sit to/from Stand, Risk manager Sit to Stand: +2 physical assistance, Mod assist, From elevated surface Stand pivot transfers: +2 safety/equipment, From elevated surface (with stedy) Squat pivot transfers: Max assist, +2 physical assistance, From elevated surface General transfer comment: Two person mod assist to stand from elevated bed with bil knees blocked and RW stabilized by PT/OT.  Verbal cues for safe hand placement,  forward motion to go up and extension at knees and hips at terminal stand. Mod assist at trunk to power up. Transferred with stedy standing frame for safe transfer to the chair.   Ambulation/Gait General Gait Details: unable at this time.  ADL: ADL Overall ADL's : Needs assistance/impaired Eating/Feeding: Modified independent, Sitting Grooming: Wash/dry hands, Wash/dry face, Set up, Sitting Grooming Details (indicate cue type and reason): in recliner Upper Body Bathing: Minimal assistance, Sitting Lower Body Bathing: Moderate assistance, Sitting/lateral leans Upper Body Dressing : Set up, Sitting Lower Body Dressing: Total assistance, Sit to/from stand Lower Body Dressing Details (indicate cue type and reason): to don socks BLE Toilet Transfer: Moderate assistance, +2 for physical assistance, Stand-pivot (using [employer] ) Armed forces technical officer Details (indicate cue type and reason): simulated through recliner transfer Toileting- Clothing Manipulation and Hygiene: Maximal assistance, Sit to/from stand Functional mobility during ADLs: Moderate assistance, +2 for physical assistance General ADL Comments: Session focused on bed level B UE strengthening exercises only as pt with temporary femoral HD catheter in R LE and R hip flexion is contraindicated. Pt fatigues easily with exercises and noted B UE tremoring as activity progressed.   Cognition: Cognition Overall Cognitive Status: Within Functional Limits for tasks assessed Orientation Level: Oriented X4 Cognition Arousal/Alertness: Awake/alert Behavior During Therapy: Anxious Overall Cognitive Status: Within Functional Limits for tasks assessed General Comments: Pt with a bit of unrealistic expectations of the speed of his recovery.  Physical Exam: Blood pressure (!) 146/80, pulse 94, temperature 98.1 F (36.7 C), temperature source Oral, resp. rate (!) 22, height 6' (1.829 m), weight 67.6 kg (149 lb 0.5 oz), SpO2 100 %. Physical Exam    Vitals reviewed. HENT:  Head: Normocephalic and atraumatic.  Eyes: EOM are normal. Left eye exhibits no discharge.  Neck: Normal range of motion. Neck supple. No thyromegaly present.  Cardiovascular: Normal rate, regular rhythm and normal heart sounds.  Exam reveals no gallop and no friction rub.   No murmur heard. Respiratory: Effort normal and breath sounds normal. No respiratory distress. He has no wheezes. He has no rales.  GI: Soft. Bowel sounds are normal. He exhibits no distension. There is no tenderness.  Musculoskeletal: He exhibits no edema or deformity.  Psychiatric: He has a normal mood and affect. His behavior is normal. Thought content normal.  Skin. Warm and dry Neurological:   Pt alert and oriented. Resting>intentional tremor.  Persistent, mild cogwheeling of arms > legs with PROM. Flat/masked facies. RUE 4/5prox to distal, LUE 4- to 4/5. RLE: 2+HF, 2+ to 3- KE and 3+/5 ADF/PF.  LLE 2/5 HF, 2+ KE and 3/5 ADF/PF DTR's tr to 1+, normal pain and light touch sense in all 4    Lab Results Last 48 Hours        Results for orders placed or performed during the hospital encounter of 11/12/16 (from the past 48 hour(s))  PSA, total and free     Status: None   Collection Time: 11/20/16  1:50 PM  Result Value Ref Range   PSA, Free <0.01 N/A ng/mL    Comment: (NOTE) Roche ECLIA methodology. Performed At: [facility] [place], [place] [id number] Lindon Romp MD FP:[id number]    PSA, Free Pct UNABLE TO CALCULATE %    Comment: (NOTE) Unable to calculate result since non-numeric result obtained for component test. The table below lists the probability of prostate cancer for men with non-suspicious DRE results and total PSA between 4 and 10 ng/mL, by patient age Ricci Barker, [place], [id number]:[id number]).                  % Free PSA       50-64 yr        65-75 yr                  0.00-10.00%        56%             55%                  10.01-15.00%        24%             35%                 15.01-20.00%        17%             23%                 20.01-25.00%        10%             20%                      >25.00%         5%              9% Please note:  Catalona et  al did not make specific              recommendations regarding the use of              percent free PSA for any other population              of men.    Prostate Specific Ag, Serum <0.1 0.0 - 4.0 ng/mL    Comment: (NOTE) Roche ECLIA methodology. According to the American Urological Association, Serum PSA should decrease and remain at undetectable levels after radical prostatectomy. The AUA defines biochemical recurrence as an initial PSA value 0.2 ng/mL or greater followed by a subsequent confirmatory PSA value 0.2 ng/mL or greater. Values obtained with different assay methods or kits cannot be used interchangeably. Results cannot be interpreted as absolute evidence of the presence or absence of malignant disease.   CBC with Differential/Platelet     Status: Abnormal   Collection Time: 11/21/16  2:38 AM  Result Value Ref Range   WBC 11.6 (H) 4.0 - 10.5 K/uL   RBC 4.54 4.22 - 5.81 MIL/uL   Hemoglobin 14.8 13.0 - 17.0 g/dL   HCT 43.2 39.0 - 52.0 %   MCV 95.2 78.0 - 100.0 fL   MCH 32.6 26.0 - 34.0 pg   MCHC 34.3 30.0 - 36.0 g/dL   RDW 15.0 11.5 - 15.5 %   Platelets 528 (H) 150 - 400 K/uL   Neutrophils Relative % 67 %   Neutro Abs 7.9 (H) 1.7 - 7.7 K/uL   Lymphocytes Relative 23 %   Lymphs Abs 2.7 0.7 - 4.0 K/uL   Monocytes Relative 8 %   Monocytes Absolute 0.9 0.1 - 1.0 K/uL   Eosinophils Relative 2 %   Eosinophils Absolute 0.2 0.0 - 0.7 K/uL   Basophils Relative 0 %   Basophils Absolute 0.0 0.0 - 0.1 K/uL  Comprehensive metabolic panel     Status: Abnormal   Collection Time: 11/21/16  2:38 AM  Result Value Ref Range   Sodium 140 135 - 145 mmol/L   Potassium 4.5 3.5 - 5.1 mmol/L    Comment: DELTA CHECK NOTED    Chloride 102 101 - 111 mmol/L   CO2 31 22 - 32 mmol/L   Glucose, Bld 131 (H) 65 - 99 mg/dL   BUN 11 6 - 20 mg/dL   Creatinine, Ser 0.60 (L) 0.61 - 1.24 mg/dL   Calcium 8.6 (L) 8.9 - 10.3 mg/dL   Total Protein 4.8 (L) 6.5 - 8.1 g/dL   Albumin 3.6 3.5 - 5.0 g/dL   AST 22 15 - 41 U/L   ALT 18 17 - 63 U/L   Alkaline Phosphatase 31 (L) 38 - 126 U/L   Total Bilirubin 0.6 0.3 - 1.2 mg/dL   GFR calc non Af Amer >60 >60 mL/min   GFR calc Af Amer >60 >60 mL/min    Comment: (NOTE) The eGFR has been calculated using the CKD EPI equation. This calculation has not been validated in all clinical situations. eGFR's persistently <60 mL/min signify possible Chronic Kidney Disease.    Anion gap 7 5 - 15  Magnesium     Status: None   Collection Time: 11/21/16  2:38 AM  Result Value Ref Range   Magnesium 2.0 1.7 - 2.4 mg/dL  Phosphorus     Status: None   Collection Time: 11/21/16  2:38 AM  Result Value Ref Range   Phosphorus 3.8 2.5 - 4.6 mg/dL  I-STAT, chem 8  Status: Abnormal   Collection Time: 11/21/16  4:50 PM  Result Value Ref Range   Sodium 140 135 - 145 mmol/L   Potassium 4.2 3.5 - 5.1 mmol/L   Chloride 101 101 - 111 mmol/L   BUN 19 6 - 20 mg/dL   Creatinine, Ser 0.50 (L) 0.61 - 1.24 mg/dL   Glucose, Bld 117 (H) 65 - 99 mg/dL   Calcium, Ion 1.14 (L) 1.15 - 1.40 mmol/L   TCO2 28 22 - 32 mmol/L   Hemoglobin 13.9 13.0 - 17.0 g/dL   HCT 41.0 39.0 - 52.0 %      Imaging Results (Last 48 hours)  Ct Chest W Contrast  Result Date: 11/20/2016 CLINICAL DATA:  Bilateral leg weakness. 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 History of prostate cancer. EXAM: CT CHEST, ABDOMEN, AND PELVIS WITH CONTRAST TECHNIQUE: Multidetector CT imaging of the chest, abdomen and pelvis was performed following the standard protocol during bolus administration of intravenous contrast. CONTRAST:  100 ml ISOVUE-300 IOPAMIDOL (ISOVUE-300) INJECTION 61% COMPARISON:  Abdominopelvic CT  08/11/2016.  Lumbar MRI 09/24/2016. FINDINGS: CT CHEST FINDINGS Cardiovascular: No acute vascular findings are demonstrated. The ascending aorta is mildly dilated to 4.2 cm. There is mild atherosclerosis of the aorta and coronary arteries. The heart size is normal. There is no pericardial effusion. Mediastinum/Nodes: There are no enlarged mediastinal, hilar or axillary lymph nodes. The thyroid gland, trachea and esophagus demonstrate no significant findings. Lungs/Pleura: There is trace pleural fluid or thickening on the left. There is mild clustered nodularity in the right upper lobe, largest component measuring 5 mm on image 65. No suspicious pulmonary nodules are demonstrated. Musculoskeletal/Chest wall: No chest wall mass or suspicious osseous findings. CT ABDOMEN AND PELVIS FINDINGS Hepatobiliary: Probable hepatic steatosis as before. No focal hepatic lesions are seen. Small gallstones are again noted. There is no gallbladder wall thickening or biliary dilatation. Pancreas: Unremarkable. No pancreatic ductal dilatation or surrounding inflammatory changes. Spleen: Normal in size without focal abnormality. Adrenals/Urinary Tract: Both adrenal glands appear normal. There are small bilateral renal cysts. There is no evidence of renal mass, urinary tract calculus or hydronephrosis. The distal right ureter is mildly dilated. The bladder appears unremarkable. Stomach/Bowel: The stomach, small bowel and proximal colon appear unremarkable. There are diverticular changes throughout the sigmoid colon. There is potential circumferential colonic wall thickening at the rectosigmoid junction (Axial image 114 and coronal image 67) as well as more superiorly (axial image 106). No evidence of bowel obstruction or perforation. Vascular/Lymphatic: There are no enlarged abdominal or pelvic lymph nodes. There is aortic and branch vessel atherosclerosis. Right femoral venous catheter in place. Reproductive: Prior prostatectomy.  No  evidence of pelvic mass. Other: Mild soft tissue stranding in the perirectal fat. There is no ascites. Musculoskeletal: No acute or worrisome osseous findings. Sclerosis and thickened trabecula throughout the left hemipelvis and L5 vertebral body are again noted, most consistent with Paget's disease. IMPRESSION: 1. No findings typical for metastatic prostate cancer in the chest, abdomen or pelvis. 2. Potential areas of circumferential colonic wall thickening near the rectosigmoid junction. These could be secondary to peristalsis. Correlation with the patient's colon cancer screening history is recommended. If screening is not up-to-date, appropriate screening/sigmoidoscopy should be considered to exclude neoplasm. 3. Clustered right upper lobe pulmonary nodularity, likely postinflammatory. 4. 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 Recommend annual imaging followup by CTA or MRA. This recommendation follows 2010 ACCF/AHA/AATS/ACR/ASA/SCA/SCAI/SIR/STS/SVM Guidelines for the Diagnosis and Management of Patients with Thoracic Aortic Disease. Circulation. 2010; 121: M034-J179  5. Additional incidental findings including cholelithiasis, colonic diverticulosis and Paget's disease in the left pelvis. Electronically Signed   By: Richardean Sale M.D.   On: 11/20/2016 14:48   Ct Abdomen Pelvis W Contrast  Result Date: 11/20/2016 CLINICAL DATA:  Bilateral leg weakness. 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 History of prostate cancer. EXAM: CT CHEST, ABDOMEN, AND PELVIS WITH CONTRAST TECHNIQUE: Multidetector CT imaging of the chest, abdomen and pelvis was performed following the standard protocol during bolus administration of intravenous contrast. CONTRAST:  100 ml ISOVUE-300 IOPAMIDOL (ISOVUE-300) INJECTION 61% COMPARISON:  Abdominopelvic CT 08/11/2016.  Lumbar MRI 09/24/2016. FINDINGS: CT CHEST FINDINGS Cardiovascular: No acute vascular findings are demonstrated. The ascending aorta is mildly dilated to 4.2 cm.  There is mild atherosclerosis of the aorta and coronary arteries. The heart size is normal. There is no pericardial effusion. Mediastinum/Nodes: There are no enlarged mediastinal, hilar or axillary lymph nodes. The thyroid gland, trachea and esophagus demonstrate no significant findings. Lungs/Pleura: There is trace pleural fluid or thickening on the left. There is mild clustered nodularity in the right upper lobe, largest component measuring 5 mm on image 65. No suspicious pulmonary nodules are demonstrated. Musculoskeletal/Chest wall: No chest wall mass or suspicious osseous findings. CT ABDOMEN AND PELVIS FINDINGS Hepatobiliary: Probable hepatic steatosis as before. No focal hepatic lesions are seen. Small gallstones are again noted. There is no gallbladder wall thickening or biliary dilatation. Pancreas: Unremarkable. No pancreatic ductal dilatation or surrounding inflammatory changes. Spleen: Normal in size without focal abnormality. Adrenals/Urinary Tract: Both adrenal glands appear normal. There are small bilateral renal cysts. There is no evidence of renal mass, urinary tract calculus or hydronephrosis. The distal right ureter is mildly dilated. The bladder appears unremarkable. Stomach/Bowel: The stomach, small bowel and proximal colon appear unremarkable. There are diverticular changes throughout the sigmoid colon. There is potential circumferential colonic wall thickening at the rectosigmoid junction (Axial image 114 and coronal image 67) as well as more superiorly (axial image 106). No evidence of bowel obstruction or perforation. Vascular/Lymphatic: There are no enlarged abdominal or pelvic lymph nodes. There is aortic and branch vessel atherosclerosis. Right femoral venous catheter in place. Reproductive: Prior prostatectomy.  No evidence of pelvic mass. Other: Mild soft tissue stranding in the perirectal fat. There is no ascites. Musculoskeletal: No acute or worrisome osseous findings. Sclerosis and  thickened trabecula throughout the left hemipelvis and L5 vertebral body are again noted, most consistent with Paget's disease. IMPRESSION: 1. No findings typical for metastatic prostate cancer in the chest, abdomen or pelvis. 2. Potential areas of circumferential colonic wall thickening near the rectosigmoid junction. These could be secondary to peristalsis. Correlation with the patient's colon cancer screening history is recommended. If screening is not up-to-date, appropriate screening/sigmoidoscopy should be considered to exclude neoplasm. 3. Clustered right upper lobe pulmonary nodularity, likely postinflammatory. 4. 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 Recommend annual imaging followup by CTA or MRA. This recommendation follows 2010 ACCF/AHA/AATS/ACR/ASA/SCA/SCAI/SIR/STS/SVM Guidelines for the Diagnosis and Management of Patients with Thoracic Aortic Disease. Circulation. 2010; 121: [id number] 5. Additional incidental findings including cholelithiasis, colonic diverticulosis and Paget's disease in the left pelvis. Electronically Signed   By: Richardean Sale M.D.   On: 11/20/2016 14:48        Medical Problem List and Plan: 1.  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             -  admit to inpatient rehab 2.  DVT Prophylaxis/Anticoagulation: Subcutaneous Lovenox. Monitor platelet counts and any signs of bleeding. Check vascular studies 3. Pain Management: Ultram 50 mg every 6 hours as needed, Zanaflex 2 mg every 8 hours as needed 4. Mood: Provide emotional support 5. Neuropsych: This patient is capable of making decisions on his own behalf. 6. Skin/Wound Care: Routine skin checks 7. Fluids/Electrolytes/Nutrition: Routine I&O with follow-up chemistries 8. Hypertension. Lisinopril 10 mg daily, Toprol 25 mg daily. Monitor with increased mobility 9. History of prostate cancer status  post prostatectomy. CT chest abdomen and pelvis showed no findings typical for metastatic prostate cancer 10. Constipation. Laxative assistance   Post Admission Physician Evaluation: 1. Functional deficits secondary  to CIDP. 2. Patient is admitted to receive collaborative, interdisciplinary care between the physiatrist, rehab nursing staff, and therapy team. 3. Patient's level of medical complexity and substantial therapy needs in context of that medical necessity cannot be provided at a lesser intensity of care such as a SNF. 4. Patient has experienced substantial functional loss from his/her baseline which was documented above under the "Functional History" and "Functional Status" headings.  Judging by the patient's diagnosis, physical exam, and functional history, the patient has potential for functional progress which will result in measurable gains while on inpatient rehab.  These gains will be of substantial and practical use upon discharge  in facilitating mobility and self-care at the household level. 5. Physiatrist will provide 24 hour management of medical needs as well as oversight of the therapy plan/treatment and provide guidance as appropriate regarding the interaction of the two. 6. The Preadmission Screening has been reviewed and patient status is unchanged unless otherwise stated above. 7. 24 hour rehab nursing will assist with bladder management, bowel management, safety, skin/wound care, disease management, medication administration, pain management and patient education  and help integrate therapy concepts, techniques,education, etc. 8. PT will assess and treat for/with: Lower extremity strength, range of motion, stamina, balance, functional mobility, safety, adaptive techniques and equipment, NMR, family ed, pain control.   Goals are: supervision. 9. OT will assess and treat for/with: ADL's, functional mobility, safety, upper extremity strength, adaptive techniques and equipment,  NMR, ego support, community reintegration.   Goals are: supervision. Therapy may proceed with showering this patient. 10. SLP will assess and treat for/with: n/a.  Goals are: n/a. 11. Case Management and Social Worker will assess and treat for psychological issues and discharge planning. 12. Team conference will be held weekly to assess progress toward goals and to determine barriers to discharge. 13. Patient will receive at least 3 hours of therapy per day at least 5 days per week. 14. ELOS: 14-18 days       15. Prognosis:  excellent     Meredith Staggers, MD, [facility] 11/23/2016

## 2016-11-23 NOTE — Discharge Summary (Signed)
Physician Discharge Summary  Eduardo Burch JJO:[id number] DOB: Sep 25, 1940 DOA: 11/12/2016  PCP: Lavone Orn, MD  Admit date: 11/12/2016 Discharge date: 11/23/2016  Admitted From: home Disposition in patient rehab  Recommendations for Outpatient Follow-up:  1. Follow up with PCP in 1-2 weeks 2. Please obtain BMP/CBC in one week  Home Healthnone Equipment/Devices:none  Discharge Condition:stable CODE STATUS:full Diet recommendation:heart healthy  Brief/Interim Summary:Eduardo Burch a 76 y.o.malewith medical history significant of the Essential HTN, Prostate cancer s/p prostatectomy, ankylosing spondylitis, CIPDand Paget's disease. Patient presents with bilateral lower extremity weakness. Patient Unable to ambulatenow. Patient has had extensive workup including MRI, PET scan, and EMG studies between hereand [facility]. 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 Previously tried on IVIG. Other working differentials included paraneoplastic syndrome or chronic inflammatory polyradiculopathy.Neuorlogy consulted and evaluated and recommending Plasmapheresis (PLEX) for 5 days. PT evaluated and feel like patient is a candidate for CIR; Physiatrist evaluated and recommended CIR however patient is unable to progress out of bed with therapy due to Femoral Line for PLEX. Rehab Coordinator to meet with Patient and Family yesterday and wife toured [employer]. Will D/C patient to Rehab once medically stable.  Patient's Platelet Count dropped to 23 on 11/18/16 but likely was Spurious as when repeated Platelet Count was 415. Patient had no issues but complained of a sore Left Ankle on 9/17 which has now resolved. This morning he had no complaints except that he was not happy with getting CT Chest/Abd/Pelvis as he feels like he has had "plenty of imaging studies" in the last few months.  Patient did have IVIG fifth treatment yesterday. CT scan of the abdomen and  chest and pelvis was done yesterday.It reveals potential area of circumferential colonic wall thickening near the rectosigmoid junction. Patient needs colon cancer screening if not done. Right upper lobe pulmonary nodularity clustered likely postinflammatory. Mild limitation of the ascending aorta 4.2 cm recommend annual imaging.     Discharge Diagnoses:  Principal Problem:   Lower extremity weakness Active Problems:   Hypertension, essential   Leg weakness   Constipation   H/O prostate cancer   Paget's disease of bone   CIDP (chronic inflammatory demyelinating polyneuropathy) (HCC)   Leukocytosis   Hypophosphatemia   Dysphagia   Abnormal LFTs   Hyperbilirubinemia   Ankle pain  cidp inaptient rehab to be authherized by insurance hopefully tomorrow.PT/OT/CM notes reviewed.   Discharge Instructions follow up with dr patel.follow up with pcp.he needs to have a colonoscopy for abnormal changes in ct scan.   Allergies as of 11/23/2016      Reactions   Remicade [infliximab] Anaphylaxis      Medication List    TAKE these medications   acetaminophen 500 MG tablet Commonly known as:  TYLENOL Take 500-1,000 mg by mouth every 6 (six) hours as needed for headache.   aspirin 81 MG chewable tablet Chew 1 tablet (81 mg total) by mouth daily.   bisacodyl 5 MG EC tablet Commonly known as:  DULCOLAX Take 1 tablet (5 mg total) by mouth daily as needed for moderate constipation.   docusate sodium 100 MG capsule Commonly known as:  COLACE Take 1 capsule (100 mg total) by mouth every 12 (twelve) hours.   gabapentin 300 MG capsule Commonly known as:  NEURONTIN Take 300 mg by mouth at bedtime.   lisinopril 10 MG tablet Commonly known as:  PRINIVIL,ZESTRIL Take 1 tablet (10 mg total) by mouth daily.   metoprolol succinate 25  MG 24 hr tablet Commonly known as:  TOPROL XL Take 1 tablet (25 mg total) by mouth daily.   tiZANidine 2 MG tablet Commonly known as:  ZANAFLEX Take 2  mg by mouth 3 (three) times daily.   traMADol 50 MG tablet Commonly known as:  ULTRAM Take 1 tablet (50 mg total) by mouth every 6 (six) hours as needed for moderate pain or severe pain.   UNABLE TO FIND OUTPATIENT PHYSICAL THERAPY AND OCCUPATIONAL THERAPY  Dx: Guillain-Barre  Evaluation and Treat            Discharge Care Instructions        Start     Ordered   11/23/16 0000  bisacodyl (DULCOLAX) 5 MG EC tablet  Daily PRN     11/23/16 1020      Allergies  Allergen Reactions  . Remicade [Infliximab] Anaphylaxis    Consultations:  neurology   Procedures/Studies: Ct Chest W Contrast  Result Date: 11/20/2016 CLINICAL DATA:  Bilateral leg weakness. 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 History of prostate cancer. EXAM: CT CHEST, ABDOMEN, AND PELVIS WITH CONTRAST TECHNIQUE: Multidetector CT imaging of the chest, abdomen and pelvis was performed following the standard protocol during bolus administration of intravenous contrast. CONTRAST:  100 ml ISOVUE-300 IOPAMIDOL (ISOVUE-300) INJECTION 61% COMPARISON:  Abdominopelvic CT 08/11/2016.  Lumbar MRI 09/24/2016. FINDINGS: CT CHEST FINDINGS Cardiovascular: No acute vascular findings are demonstrated. The ascending aorta is mildly dilated to 4.2 cm. There is mild atherosclerosis of the aorta and coronary arteries. The heart size is normal. There is no pericardial effusion. Mediastinum/Nodes: There are no enlarged mediastinal, hilar or axillary lymph nodes. The thyroid gland, trachea and esophagus demonstrate no significant findings. Lungs/Pleura: There is trace pleural fluid or thickening on the left. There is mild clustered nodularity in the right upper lobe, largest component measuring 5 mm on image 65. No suspicious pulmonary nodules are demonstrated. Musculoskeletal/Chest wall: No chest wall mass or suspicious osseous findings. CT ABDOMEN AND PELVIS FINDINGS Hepatobiliary: Probable hepatic steatosis as before. No focal hepatic lesions are  seen. Small gallstones are again noted. There is no gallbladder wall thickening or biliary dilatation. Pancreas: Unremarkable. No pancreatic ductal dilatation or surrounding inflammatory changes. Spleen: Normal in size without focal abnormality. Adrenals/Urinary Tract: Both adrenal glands appear normal. There are small bilateral renal cysts. There is no evidence of renal mass, urinary tract calculus or hydronephrosis. The distal right ureter is mildly dilated. The bladder appears unremarkable. Stomach/Bowel: The stomach, small bowel and proximal colon appear unremarkable. There are diverticular changes throughout the sigmoid colon. There is potential circumferential colonic wall thickening at the rectosigmoid junction (Axial image 114 and coronal image 67) as well as more superiorly (axial image 106). No evidence of bowel obstruction or perforation. Vascular/Lymphatic: There are no enlarged abdominal or pelvic lymph nodes. There is aortic and branch vessel atherosclerosis. Right femoral venous catheter in place. Reproductive: Prior prostatectomy.  No evidence of pelvic mass. Other: Mild soft tissue stranding in the perirectal fat. There is no ascites. Musculoskeletal: No acute or worrisome osseous findings. Sclerosis and thickened trabecula throughout the left hemipelvis and L5 vertebral body are again noted, most consistent with Paget's disease. IMPRESSION: 1. No findings typical for metastatic prostate cancer in the chest, abdomen or pelvis. 2. Potential areas of circumferential colonic wall thickening near the rectosigmoid junction. These could be secondary to peristalsis. Correlation with the patient's colon cancer screening history is recommended. If screening is not up-to-date, appropriate screening/sigmoidoscopy should be considered to exclude neoplasm. 3. Clustered  right upper lobe pulmonary nodularity, likely postinflammatory. 4. 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 Recommend annual imaging  followup by CTA or MRA. This recommendation follows 2010 ACCF/AHA/AATS/ACR/ASA/SCA/SCAI/SIR/STS/SVM Guidelines for the Diagnosis and Management of Patients with Thoracic Aortic Disease. Circulation. 2010; 121: e266-e369 5. Additional incidental findings including cholelithiasis, colonic diverticulosis and Paget's disease in the left pelvis. Electronically Signed   By: Richardean Sale M.D.   On: 11/20/2016 14:48   Ct Abdomen Pelvis W Contrast  Result Date: 11/20/2016 CLINICAL DATA:  Bilateral leg weakness. 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 History of prostate cancer. EXAM: CT CHEST, ABDOMEN, AND PELVIS WITH CONTRAST TECHNIQUE: Multidetector CT imaging of the chest, abdomen and pelvis was performed following the standard protocol during bolus administration of intravenous contrast. CONTRAST:  100 ml ISOVUE-300 IOPAMIDOL (ISOVUE-300) INJECTION 61% COMPARISON:  Abdominopelvic CT 08/11/2016.  Lumbar MRI 09/24/2016. FINDINGS: CT CHEST FINDINGS Cardiovascular: No acute vascular findings are demonstrated. The ascending aorta is mildly dilated to 4.2 cm. There is mild atherosclerosis of the aorta and coronary arteries. The heart size is normal. There is no pericardial effusion. Mediastinum/Nodes: There are no enlarged mediastinal, hilar or axillary lymph nodes. The thyroid gland, trachea and esophagus demonstrate no significant findings. Lungs/Pleura: There is trace pleural fluid or thickening on the left. There is mild clustered nodularity in the right upper lobe, largest component measuring 5 mm on image 65. No suspicious pulmonary nodules are demonstrated. Musculoskeletal/Chest wall: No chest wall mass or suspicious osseous findings. CT ABDOMEN AND PELVIS FINDINGS Hepatobiliary: Probable hepatic steatosis as before. No focal hepatic lesions are seen. Small gallstones are again noted. There is no gallbladder wall thickening or biliary dilatation. Pancreas: Unremarkable. No pancreatic ductal dilatation or surrounding  inflammatory changes. Spleen: Normal in size without focal abnormality. Adrenals/Urinary Tract: Both adrenal glands appear normal. There are small bilateral renal cysts. There is no evidence of renal mass, urinary tract calculus or hydronephrosis. The distal right ureter is mildly dilated. The bladder appears unremarkable. Stomach/Bowel: The stomach, small bowel and proximal colon appear unremarkable. There are diverticular changes throughout the sigmoid colon. There is potential circumferential colonic wall thickening at the rectosigmoid junction (Axial image 114 and coronal image 67) as well as more superiorly (axial image 106). No evidence of bowel obstruction or perforation. Vascular/Lymphatic: There are no enlarged abdominal or pelvic lymph nodes. There is aortic and branch vessel atherosclerosis. Right femoral venous catheter in place. Reproductive: Prior prostatectomy.  No evidence of pelvic mass. Other: Mild soft tissue stranding in the perirectal fat. There is no ascites. Musculoskeletal: No acute or worrisome osseous findings. Sclerosis and thickened trabecula throughout the left hemipelvis and L5 vertebral body are again noted, most consistent with Paget's disease. IMPRESSION: 1. No findings typical for metastatic prostate cancer in the chest, abdomen or pelvis. 2. Potential areas of circumferential colonic wall thickening near the rectosigmoid junction. These could be secondary to peristalsis. Correlation with the patient's colon cancer screening history is recommended. If screening is not up-to-date, appropriate screening/sigmoidoscopy should be considered to exclude neoplasm. 3. Clustered right upper lobe pulmonary nodularity, likely postinflammatory. 4. 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 Recommend annual imaging followup by CTA or MRA. This recommendation follows 2010 ACCF/AHA/AATS/ACR/ASA/SCA/SCAI/SIR/STS/SVM Guidelines for the Diagnosis and Management of Patients with Thoracic Aortic  Disease. Circulation. 2010; 121: e266-e369 5. Additional incidental findings including cholelithiasis, colonic diverticulosis and Paget's disease in the left pelvis. Electronically Signed   By: Richardean Sale M.D.   On: 11/20/2016 14:48   Dg Chest  Port 1 View  Result Date: 11/13/2016 CLINICAL DATA:  R/O pneumothorax. Encounter for central line placement. Hx of HTN. Pt is a former smoker. EXAM: PORTABLE CHEST 1 VIEW COMPARISON:  11/12/2016 FINDINGS: Despite the history, it no central line is identified. There is no pneumothorax. By history not right IJ line was attempted but not placed. Patient has a new right femoral line. The heart size and mediastinal contours are within normal limits. Both lungs are clear. The visualized skeletal structures are unremarkable. IMPRESSION: No active disease.  Central line not identified. Electronically Signed   By: Nolon Nations M.D.   On: 11/13/2016 14:51   Dg Chest Portable 1 View  Result Date: 11/12/2016 CLINICAL DATA:  Bilateral leg numbness and cough. EXAM: PORTABLE CHEST 1 VIEW COMPARISON:  10/05/2005 FINDINGS: The heart size and mediastinal contours are within normal limits. Both lungs are clear. The visualized skeletal structures are unremarkable. IMPRESSION: No active disease. Electronically Signed   By: Misty Stanley M.D.   On: 11/12/2016 19:41    (Echo, Carotid, EGD, Colonoscopy, ERCP)    Subjective:   Discharge Exam: Vitals:   11/22/16 2345 11/23/16 0441  BP: (!) 144/82 139/75  Pulse: 94 90  Resp: 18 19  Temp:  98 F (36.7 C)  SpO2: 100% 99%   Vitals:   11/22/16 1600 11/22/16 1956 11/22/16 2345 11/23/16 0441  BP: (!) 153/71 (!) 149/86 (!) 144/82 139/75  Pulse: 89 (!) 104 94 90  Resp: (!) 23 20 18 19   Temp:  98.1 F (36.7 C)  98 F (36.7 C)  TempSrc:  Oral  Oral  SpO2: 100% 98% 100% 99%  Weight:      Height:        General: Pt is alert, awake, not in acute distress Cardiovascular: RRR, S1/S2 +, no rubs, no  gallops Respiratory: CTA bilaterally, no wheezing, no rhonchi Abdominal: Soft, NT, ND, bowel sounds + Extremities: no edema, no cyanosis    The results of significant diagnostics from this hospitalization (including imaging, microbiology, ancillary and laboratory) are listed below for reference.     Microbiology: No results found for this or any previous visit (from the past 240 hour(s)).   Labs: BNP (last 3 results) No results for input(s): BNP in the last 8760 hours. Basic Metabolic Panel:  Recent Labs Lab 11/17/16 0339  11/18/16 0712 11/19/16 0229 11/19/16 0927 11/20/16 0311 11/21/16 0238 11/21/16 1650  NA 141  < > 140 138 141 139 140 140  K 4.5  < > 3.6 3.5 3.7 3.7 4.5 4.2  CL 110  < > 111 110 103 106 102 101  CO2 28  --  23 23  --  26 31  --   GLUCOSE 102*  < > 103* 99 147* 104* 131* 117*  BUN 18  < > 17 16 13 6 11 19   CREATININE 0.60*  < > 0.50* 0.46* 0.40* 0.50* 0.60* 0.50*  CALCIUM 8.5*  --  8.3* 8.1*  --  8.5* 8.6*  --   MG 2.1  --  1.8 1.8  --  1.7 2.0  --   PHOS 3.7  --  3.7 3.4  --  3.2 3.8  --   < > = values in this interval not displayed. Liver Function Tests:  Recent Labs Lab 11/17/16 0339 11/18/16 0712 11/19/16 0229 11/20/16 0311 11/21/16 0238  AST 29 19 21 19 22   ALT 15* 13* 13* 15* 18  ALKPHOS 30* 21* 26* 20* 31*  BILITOT 1.4*  1.2 0.6 1.6* 0.6  PROT 4.7* 4.4* 4.5* 4.9* 4.8*  ALBUMIN 3.4* 3.4* 3.2* 4.1 3.6   No results for input(s): LIPASE, AMYLASE in the last 168 hours. No results for input(s): AMMONIA in the last 168 hours. CBC:  Recent Labs Lab 11/18/16 0712 11/18/16 1046 11/19/16 0229 11/19/16 0927 11/20/16 0311 11/21/16 0238 11/21/16 1650  WBC 7.6 9.0 9.3  --  11.7* 11.6*  --   NEUTROABS 4.8 6.5 5.9  --  8.1* 7.9*  --   HGB 13.4 12.9* 12.8* 11.6* 14.1 14.8 13.9  HCT 39.3 38.8* 37.7* 34.0* 41.6 43.2 41.0  MCV 95.4 96.5 95.7  --  95.0 95.2  --   PLT QUESTIONABLE RESULTS, RECOMMEND RECOLLECT TO VERIFY 415* 416*  --  445* 528*   --    Cardiac Enzymes: No results for input(s): CKTOTAL, CKMB, CKMBINDEX, TROPONINI in the last 168 hours. BNP: Invalid input(s): POCBNP CBG: No results for input(s): GLUCAP in the last 168 hours. D-Dimer No results for input(s): DDIMER in the last 72 hours. Hgb A1c No results for input(s): HGBA1C in the last 72 hours. Lipid Profile No results for input(s): CHOL, HDL, LDLCALC, TRIG, CHOLHDL, LDLDIRECT in the last 72 hours. Thyroid function studies No results for input(s): TSH, T4TOTAL, T3FREE, THYROIDAB in the last 72 hours.  Invalid input(s): FREET3 Anemia work up No results for input(s): VITAMINB12, FOLATE, FERRITIN, TIBC, IRON, RETICCTPCT in the last 72 hours. Urinalysis    Component Value Date/Time   COLORURINE AMBER (A) 11/13/2016 0213   APPEARANCEUR HAZY (A) 11/13/2016 0213   LABSPEC 1.031 (H) 11/13/2016 0213   PHURINE 5.0 11/13/2016 0213   GLUCOSEU NEGATIVE 11/13/2016 0213   HGBUR NEGATIVE 11/13/2016 0213   BILIRUBINUR NEGATIVE 11/13/2016 0213   KETONESUR 5 (A) 11/13/2016 0213   PROTEINUR >=300 (A) 11/13/2016 0213   NITRITE NEGATIVE 11/13/2016 0213   LEUKOCYTESUR NEGATIVE 11/13/2016 0213   Sepsis Labs Invalid input(s): PROCALCITONIN,  WBC,  LACTICIDVEN Microbiology No results found for this or any previous visit (from the past 240 hour(s)).   Time coordinating discharge: Over 30 minutes  SIGNED:   Georgette Shell, MD  [facility] 11/23/2016, 10:21 AM Pager   If 7PM-7AM, please contact night-coverage [email or web address] Password [email or web address]

## 2016-11-24 ENCOUNTER — Inpatient Hospital Stay (HOSPITAL_COMMUNITY): Payer: Medicare Other | Admitting: Occupational Therapy

## 2016-11-24 ENCOUNTER — Encounter (HOSPITAL_COMMUNITY): Payer: Self-pay

## 2016-11-24 ENCOUNTER — Inpatient Hospital Stay (HOSPITAL_COMMUNITY): Payer: Medicare Other | Admitting: *Deleted

## 2016-11-24 NOTE — Plan of Care (Signed)
Problem: SCI BOWEL ELIMINATION Goal: RH STG SCI MANAGE BOWEL WITH MEDICATION WITH ASSISTANCE STG SCI Manage bowel with medication with min assistance.  Outcome: Progressing Last BM 11/23/2016  Problem: RH SKIN INTEGRITY Goal: RH STG SKIN FREE OF INFECTION/BREAKDOWN Patients skin will remain free from further infection or breakdown with min assist.  Outcome: Progressing Skin clean, dry and intact  Problem: RH BLADDER ELIMINATION Goal: RH STG MANAGE BLADDER WITH ASSISTANCE STG Manage Bladder With Assistance Outcome: Progressing Uses urinal with minimum assistance  Problem: RH SKIN INTEGRITY Goal: RH STG SKIN FREE OF INFECTION/BREAKDOWN Outcome: Progressing No skin issues noted  Problem: RH SAFETY Goal: RH STG ADHERE TO SAFETY PRECAUTIONS W/ASSISTANCE/DEVICE STG Adhere to Safety Precautions With Assistance/Device. Outcome: Progressing No safety issues noted  Problem: RH PAIN MANAGEMENT Goal: RH STG PAIN MANAGED AT OR BELOW PT'S PAIN GOAL Outcome: Progressing Denies pain and discomfort

## 2016-11-24 NOTE — Progress Notes (Signed)
Occupational Therapy Session Note  Patient Details  Name: Eduardo Burch MRN: [id number] Date of Birth: Jan 01, 1941  Today's Date: 11/24/2016 OT Individual Time: 1330-1415 OT Individual Time Calculation (min): 45 min    Short Term Goals: Week 1:  OT Short Term Goal 1 (Week 1): Pt will complete sit to stand during LB dressing with max assist of one person. OT Short Term Goal 2 (Week 1): Pt will perform squat pivot transfers to drop arm commode with mod assist.  OT Short Term Goal 3 (Week 1): Pt will donn pants, socks, and shoes over feet with supervision in sitting and use of AE PRN.   Skilled Therapeutic Interventions/Progress Updates:   Pt received in w/c. Pt was in a 18.5 inch high 20x18 chair and he felt that his hips were too low from knees.The 20 inch was slightly too wide to give him good leverage to push up. A 20 inch high 18x16 with a higher cushion was obtained for pt to try. A taller 18x18 w/c was not available at this time.  Pt taken to gym where he practiced using a slide board with steadying A to mat and then from mat to the new chair.Pt felt the seating was more supportive and his hips were higher than before. The 16 length is too short but it seemed the better of the 2 options. Pt stated the length was ok and he felt comfortable.  The leg rests could be switch for ones that extend longer.  Pt worked on UE exercises with 3# dowel bar focusing on shoulder strength.  He has limited L elb ext from old football injury.  Pt taken back to room and he used board to transfer to bed with min A.  Practiced bed mobility techniques for controlled sit to sidelying with min A.  Pt resting in bed with all needs met.  Therapy Documentation Precautions:  Precautions Precautions: Fall Restrictions Weight Bearing Restrictions: No    Vital Signs: Therapy Vitals Pulse Rate: (!) 107 (after gait) BP: 119/60 Patient Position (if appropriate): Sitting Pain: Pain Assessment Pain Assessment:  No/denies pain ADL:    See Function Navigator for Current Functional Status.   Therapy/Group: Individual Therapy  Paulena Servais 11/24/2016, 1:14 PM

## 2016-11-24 NOTE — Progress Notes (Signed)
[place] PHYSICAL MEDICINE & REHABILITATION     PROGRESS NOTE    Subjective/Complaints: Had a reasonable night. Had several formed bm's over night (had been constipated)  ROS: pt denies nausea, vomiting, diarrhea, cough, shortness of breath or chest pain    Objective: Vital Signs: Blood pressure (!) 143/81, pulse 89, temperature (!) 97.5 F (36.4 C), temperature source Oral, resp. rate 18, height 6' (1.829 m), weight 74.9 kg (165 lb 2 oz), SpO2 100 %. No results found.  Recent Labs  11/21/16 1650 11/23/16 1405  WBC  --  10.9*  HGB 13.9 12.9*  HCT 41.0 38.5*  PLT  --  491*    Recent Labs  11/21/16 1650 11/23/16 1405  NA 140  --   K 4.2  --   CL 101  --   GLUCOSE 117*  --   BUN 19  --   CREATININE 0.50* 0.51*   CBG (last 3)  No results for input(s): GLUCAP in the last 72 hours.  Wt Readings from Last 3 Encounters:  11/23/16 74.9 kg (165 lb 2 oz)  11/22/16 67.6 kg (149 lb 0.5 oz)  10/12/16 74.8 kg (164 lb 12.8 oz)    Physical Exam:  HENT:  Head: Normocephalicand atraumatic.  Eyes: EOMare normal. Left eye exhibits no discharge.  Neck: Normal range of motion. Neck supple. No thyromegalypresent.  Cardiovascular:RRR without murmur. No JVD   Respiratory: CTA Bilaterally without wheezes or rales. Normal effort   GI: Soft. Bowel sounds are normal. He exhibits no distension. There is no tenderness.  Musculoskeletal: He exhibits no edemaor deformity.  Psychiatric: pleasant and cooperative Skin. Warm and dry Neurological:  Pt alert and oriented. Resting>intentional tremor. Persistent, mild cogwheeling of arms >legs with PROM. Flat/masked facies. RUE 4/5prox to distal, LUE 4- to 4/5. RLE: 2+HF, 2+ to 3- KE and 3+/5 ADF/PF. LLE 2/5 HF, 2+ KE and 3/5 ADF/PFDTR's tr to 1+, normal pain and light touch sense in all 4---motor and sensory exam stable  Assessment/Plan: 1. Functional deficits secondary to CIDP which require 3+ hours per day of interdisciplinary  therapy in a comprehensive inpatient rehab setting. Physiatrist is providing close team supervision and 24 hour management of active medical problems listed below. Physiatrist and rehab team continue to assess barriers to discharge/monitor patient progress toward functional and medical goals.  Function:  Bathing Bathing position      Bathing parts      Bathing assist        Upper Body Dressing/Undressing Upper body dressing                    Upper body assist        Lower Body Dressing/Undressing Lower body dressing                                  Lower body assist        Toileting Toileting          Toileting assist     Transfers Chair/bed transfer             Locomotion Ambulation           Wheelchair          Cognition Comprehension    Expression    Social Interaction    Problem Solving    Memory     Medical Problem List and Plan: 1. Decreased functional mobilitysecondary to AIDP/CIDP likely motor  variant -5 day course of plasmapheresiscompleted 11/21/2016. -begin therapies today 2. DVT Prophylaxis/Anticoagulation: Subcutaneous Lovenox. Monitor platelet counts and any signs of bleeding. Check vascular studies 3. Pain Management: Ultram 50 mg every 6 hours as needed, Zanaflex 2 mg every 8 hours as needed 4. Mood: Provide emotional support 5. Neuropsych: This patient iscapable of making decisions on hisown behalf. 6. Skin/Wound Care: Routine skin checks 7. Fluids/Electrolytes/Nutrition: Routine I&O with follow-up chemistries 8.Hypertension. Lisinopril 10 mg daily, Toprol 25 mg daily. Monitor with increased mobility 9.History of prostate cancer status post prostatectomy. CT chest abdomen and pelvis showed no findings typical for metastatic prostate cancer 10.Constipation. Laxative assistance  LOS (Days) 1 A FACE TO FACE EVALUATION WAS PERFORMED  Meredith Staggers, MD 11/24/2016 9:44 AM

## 2016-11-24 NOTE — Evaluation (Signed)
Occupational Therapy Assessment and Plan  Patient Details  Name: Eduardo Burch MRN: [id number] Date of Birth: 06/15/1940  OT Diagnosis: muscle weakness (generalized) Rehab Potential: Rehab Potential (ACUTE ONLY): Good ELOS: 23-25 days   Today's Date: 11/24/2016 OT Individual Time: 8:00-9:18 OT Individual Time Calculation (min): 78 mins     Problem List:  Patient Active Problem List   Diagnosis Date Noted  . Ankle pain 11/19/2016  . Hyperbilirubinemia 11/17/2016  . Leukocytosis 11/14/2016  . Hypophosphatemia 11/14/2016  . Dysphagia 11/14/2016  . Abnormal LFTs 11/14/2016  . Lower extremity weakness 11/13/2016  . CIDP (chronic inflammatory demyelinating polyneuropathy) ([place]) 11/12/2016  . Back pain without sciatica 10/25/2016  . 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 Hyperlipidemia 10/03/2016  . H/O prostate cancer 10/03/2016  . Hyponatremia 10/03/2016  . Leg weakness 09/28/2016  . Constipation   . Tachycardia 08/16/2016  . Hypertension, essential 08/15/2016    Past Medical History:  Past Medical History:  Diagnosis Date  . Constipated   . Elevated blood pressure reading   . 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 Hypercholesterolemia   . Hypertension   . Spondylosis    Past Surgical History:  Past Surgical History:  Procedure Laterality Date  . PROATATECTOMY      Assessment & Plan Clinical Impression: Patient is a 76 y.o. year old male with recent admission to the hospital on 11/12/2016 with bilateral lower extremity weakness as well as decrease in functional mobility that progressed over the past 4 months. Patient reports having recent fall as well as reported 50 pound weight loss over the last few months. 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 Two-level home with bedroom Main floor. Patient had had an extensive workup over the past few months including MRI, PET scan and EMG studies completed at [facility]  followed by neurology services Dr. Posey Pronto [facility]rology[facility] well at [facility]. 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 Neurology again consulted planning for 5 day course of plasmapheresis for suspect progression of his CIDP-likely pure motor variant and completed 11/21/2016.CT of abdomen and chest as well as pelvis revealed potential area of circumferential colonic wall thickening near the rectosigmoid junction. No findings of typical metastatic prostate cancer in the chest abdomen or pelvis. .  Patient transferred to CIR on 11/23/2016 .    Patient currently requires total with basic self-care skills and IADL secondary to muscle weakness, unbalanced muscle activation and decreased standing balance, decreased postural control and decreased balance strategies.  Prior to hospitalization, patient could complete ADLs with independent .  Patient will benefit from skilled intervention to decrease level of assist with basic self-care skills and increase independence with basic self-care skills prior to discharge home with care partner.  Anticipate patient will require intermittent supervision and follow up home health.  OT - End of Session Activity Tolerance: Tolerates 10 - 20 min activity with multiple rests Endurance Deficit: Yes Endurance Deficit Description: Pt needs rest following 5 reps of gravity reduced therex. HR increaesd to >100 after walk, needing 5 min to recover to WNL OT Assessment Rehab Potential (ACUTE ONLY): Good OT Patient demonstrates impairments in the following area(s): Balance;Endurance;Motor OT Basic ADL's Functional Problem(s): Grooming;Bathing;Dressing;Toileting;Eating OT Transfers Functional Problem(s): Toilet;Tub/Shower OT Additional Impairment(s): Fuctional Use of Upper Extremity OT Plan OT Intensity: Minimum of 1-2 x/day, 45 to 90 minutes OT Frequency: 5 out of 7 days OT Duration/Estimated Length of  Stay: 23-25 days OT  Treatment/Interventions: Balance/vestibular training;Community reintegration;DME/adaptive equipment instruction;Neuromuscular re-education;UE/LE Strength taining/ROM;Therapeutic Exercise;Patient/family education;Functional mobility training;Disease mangement/prevention;Discharge planning;Pain management;Self Care/advanced ADL retraining;Therapeutic Activities;UE/LE Coordination activities OT Self Feeding Anticipated Outcome(s): modified independent OT Basic Self-Care Anticipated Outcome(s): min assist level OT Toileting Anticipated Outcome(s): min assist level OT Bathroom Transfers Anticipated Outcome(s): min assist level OT Recommendation Patient destination: Home Follow Up Recommendations: 24 hour supervision/assistance;Home health OT Equipment Recommended: 3 in 1 bedside comode;Tub/shower bench;Wheelchair (measurements);Wheelchair cushion (measurements)   Skilled Therapeutic Intervention Began working on selfcare retraining sit to stand during session.  Attempted sit to stand from the wheelchair without use of Steady and pt only able to achieve 3/4 of full standing with total assist of therapist, and then unable to maintain for any duration of time.  Utilized Steady for standing and for transfer into the shower as well.  Pt needed max assist for washing peri area secondary to not being able to stand and not being able to reach area with lateral leans to the side.  Utilized Steady for transfer out of the shower and for sit to stand transitions for pulling up brief and for pulling up pants.  Total assist +2 (pt 20%) for standing in order to pull pants over hips secondary to pt not being able to achieve full standing and maintain for more than 5-6 seconds.  Supervision for all UB selfcare with increased bilateral tremors noted.  Pt with decreased ability to reach his feet for doffing gripper socks, donning pants, or shoes.  Will benefit from AE trial to assist with this.  Finished session with pt in  wheelchair eating breakfast.  Call button and phone in reach.      OT Evaluation Precautions/Restrictions  Precautions Precautions: Fall Restrictions Weight Bearing Restrictions: No  Pain Pain Assessment Pain Assessment: No/denies pain Home Living/Prior Functioning Home Living Family/patient expects to be discharged to:: Private residence Living Arrangements: Spouse/significant other Available Help at Discharge: Family, Available 24 hours/day Type of Home: House Home Access: Stairs to enter [employer] of Steps: 5 Entrance Stairs-Rails: Right, Left Home Layout: Two level, Able to live on main level with bedroom/bathroom Alternate Level Stairs-Number of Steps: flight Bathroom Shower/Tub: Multimedia programmer: Handicapped height Bathroom Accessibility: Yes Additional Comments: Has 4WW, RW, shower chair, transport chair  Lives With: Spouse IADL History Occupation: Part time employment Prior Function Level of Independence: Independent with basic ADLs  Able to Take Stairs?: Yes Driving: Yes Vocation: Part time employment Comments: Patient works part-time at his company (desk job); used to be race Nutritional therapist ADL   Vision Baseline Vision/History: Wears glasses Wears Glasses: Reading only Patient Visual Report: Blurring of vision Vision Assessment?: Yes Eye Alignment: Within Functional Limits Ocular Range of Motion: Within Functional Limits Alignment/Gaze Preference: Within Defined Limits Tracking/Visual Pursuits: Able to track stimulus in all quads without difficulty Convergence: Within functional limits Visual Fields: No apparent deficits Perception  Perception: Within Functional Limits Praxis Praxis: Intact Cognition Overall Cognitive Status: Within Functional Limits for tasks assessed Arousal/Alertness: Awake/alert Orientation Level: Person;Place;Situation Person: Oriented Place: Oriented Situation: Oriented Year: 2018 Month:  September Day of Week: Correct Memory: Appears intact Immediate Memory Recall: Sock;Blue;Bed Memory Recall: Sock;Blue;Bed Memory Recall Sock: Without Cue Memory Recall Blue: Without Cue Memory Recall Bed: Without Cue Attention: Sustained;Selective Sustained Attention: Appears intact Selective Attention: Appears intact Awareness: Appears intact Problem Solving: Appears intact Safety/Judgment: Appears intact Sensation Sensation Light Touch: Appears Intact Stereognosis: Appears Intact Hot/Cold: Appears Intact Proprioception: Appears Intact Additional Comments: Sensation intact in  BUEs Coordination Gross Motor Movements are Fluid and Coordinated: No Fine Motor Movements are Fluid and Coordinated: No Coordination and Movement Description: Pt with increased tremors in BUEs affecting FM coordination at times such as self feeding.  He could use the UEs WFLS for bathing, dressing, and grooming tasks with increased time.  Heel Shin Test: limited by weakness, only able to perform 1/4-1/2 movement Motor  Motor Motor: Paraplegia Motor - Skilled Clinical Observations: Motor control wanes quickly  Mobility  Bed Mobility Bed Mobility: Supine to Sit;Sit to Supine Supine to Sit: 3: Mod assist Sit to Supine: 3: Mod assist Transfers Transfers: Sit to Stand;Stand to Sit Sit to Stand: 1: +2 Total assist;With upper extremity assist;Other/comment (pulling up on the Steady) Sit to Stand: Patient Percentage: 20% Stand to Sit: 1: +2 Total assist;With upper extremity assist;Other (comment) (pulling up on the Steady) Stand to Sit: Patient Percentage: 30%  Trunk/Postural Assessment  Cervical Assessment Cervical Assessment: Within Functional Limits Thoracic Assessment Thoracic Assessment: Within Functional Limits Lumbar Assessment Lumbar Assessment: Exceptions to [facility] Lumbar AROM Overall Lumbar AROM Comments: slight lordosis Postural Control Postural Control: Deficits on evaluation Righting  Reactions: Insufficient, relies on UE's for control  Balance Balance Balance Assessed: Yes Static Sitting Balance Static Sitting - Balance Support: Feet supported Static Sitting - Level of Assistance: 5: Stand by assistance Dynamic Sitting Balance Dynamic Sitting - Balance Support: During functional activity Dynamic Sitting - Level of Assistance: 4: Min assist Static Standing Balance Static Standing - Balance Support: Bilateral upper extremity supported Static Standing - Level of Assistance: 1: +2 Total assist Static Standing - Comment/# of Minutes: 10 seconds with max lifting and lowering assist Dynamic Standing Balance Dynamic Standing - Balance Support: Bilateral upper extremity supported;During functional activity Dynamic Standing - Level of Assistance: 2: Max assist Extremity/Trunk Assessment RUE Assessment RUE Assessment: Exceptions to [facility] RUE Strength RUE Overall Strength Comments: AROM shoulder flexion 0-150 degrees with strength at a 3+5 level.  Elbow flexion 4/5 as well as gross grasp.  Elbow extension 3+/5 as well.  LUE Assessment LUE Assessment: Exceptions to [facility] LUE Strength LUE Overall Strength Comments: AROM shoulder flexion 0-150 degrees with strength at a 3+5 level.  Elbow flexion 4/5 as well as gross grasp.  Elbow extension 3+/5 as well.    See Function Navigator for Current Functional Status.   Refer to Care Plan for Long Term Goals  Recommendations for other services: None    Discharge Criteria: Patient will be discharged from OT if patient refuses treatment 3 consecutive times without medical reason, if treatment goals not met, if there is a change in medical status, if patient makes no progress towards goals or if patient is discharged from hospital.  The above assessment, treatment plan, treatment alternatives and goals were discussed and mutually agreed upon: by patient  Astella Desir OTR/L 11/24/2016, 1:02 PM

## 2016-11-24 NOTE — Evaluation (Signed)
Physical Therapy Assessment and Plan  Patient Details  Name: Eduardo Burch MRN: [id number] Date of Birth: [birth year]  PT Diagnosis: Difficulty walking and Muscle weakness Rehab Potential: Good ELOS: 17-21 days   Today's Date: 11/24/2016 PT Individual Time: 1050-1205 PT Individual Time Calculation (min): 75 min    Problem List:  Patient Active Problem List   Diagnosis Date Noted  . Ankle pain 11/19/2016  . Hyperbilirubinemia 11/17/2016  . Leukocytosis 11/14/2016  . Hypophosphatemia 11/14/2016  . Dysphagia 11/14/2016  . Abnormal LFTs 11/14/2016  . Lower extremity weakness 11/13/2016  . CIDP (chronic inflammatory demyelinating polyneuropathy) ([place]) 11/12/2016  . Back pain without sciatica 10/25/2016  . 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 Hyperlipidemia 10/03/2016  . H/O prostate cancer 10/03/2016  . Hyponatremia 10/03/2016  . Leg weakness 09/28/2016  . Constipation   . Tachycardia 08/16/2016  . Hypertension, essential 08/15/2016    Past Medical History:  Past Medical History:  Diagnosis Date  . Constipated   . Elevated blood pressure reading   . 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 Hypercholesterolemia   . Hypertension   . Spondylosis    Past Surgical History:  Past Surgical History:  Procedure Laterality Date  . PROATATECTOMY      Assessment & Plan Clinical Impression: HPI: Eduardo Burch a 76 y.o.right handed malewith history of hypertension, prostate cancer with prostatectomy, Paget's disease. Presented 11/12/2016 with bilateral lower extremity weakness as well as decrease in functional mobility that progressed over the past 4 months. Patient reports having recent fall as well as reported 50 pound weight loss over the last few months. 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 Two-level home with bedroom Main floor. Patient had had an extensive workup over the past few months including MRI, PET scan and  EMG [facility] at [facility] followed by neurology services Dr. Posey Pronto [facility]ne[facility]rologyas well at [facility]. 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 Neurology again consulted planning for 5 day course of plasmapheresis for suspect progression of his CIDP-likely pure motor variant and completed 11/21/2016.CT of abdomen and chest as well as pelvis revealed potential area of circumferential colonic wall thickening near the rectosigmoid junction. No findings of typical metastatic prostate cancer in the chest abdomen or pelvis.Patient would follow-up neurology services as an outpatient after discharge and if no improvement decision on possible immunosuppression and monthly IVIG versus IV Rituxan.Subcutaneous Lovenox for DVT prophylaxis. Physical occupational therapy evaluation completed 11/14/2016 with recommendations of physical medicine rehabilitation consult.Patient was admitted for a comprehensive rehabilitation program. Patient transferred to CIR on 11/23/2016 .   Patient currently requires max with mobility secondary to muscle weakness and muscle joint tightness, decreased cardiorespiratoy endurance and unbalanced muscle activation.  Prior to hospitalization, patient was modified independent  with mobility and lived with Spouse in a House home.  Home access is 5Stairs to enter.  Patient will benefit from skilled PT intervention to maximize safe functional mobility, minimize fall risk and decrease caregiver burden for planned discharge home with 24 hour assist.  Anticipate patient will benefit from follow up [facility] at discharge.  PT - End of Session Activity Tolerance: Tolerates < 10 min activity with changes in vital signs Endurance Deficit: Yes Endurance Deficit Description: Pt needs rest following 5 reps of gravity reduced therex. HR increaesd to >100 after walk, needing 5 min to recover to WNL PT Assessment Rehab Potential (ACUTE/IP ONLY): Good PT  Barriers to  Discharge: Inaccessible home environment PT Barriers to Discharge Comments: Will likely need ramp built PT Patient demonstrates impairments in the following area(s): Balance;Endurance;Motor;Safety PT Transfers Functional Problem(s): Bed Mobility;Bed to Chair;Car;Furniture PT Locomotion Functional Problem(s): Ambulation;Wheelchair Mobility;Stairs PT Plan PT Intensity: Minimum of 1-2 x/day ,45 to 90 minutes PT Frequency: 5 out of 7 days PT Duration Estimated Length of Stay: 17-21 days PT Treatment/Interventions: Ambulation/gait training;Balance/vestibular training;Community reintegration;Discharge planning;Disease management/prevention;DME/adaptive equipment instruction;Functional mobility training;Neuromuscular re-education;Pain management;Patient/family education;Psychosocial support;Splinting/orthotics;Stair training;Therapeutic Activities;Therapeutic Exercise;UE/LE Strength taining/ROM;UE/LE Coordination activities;Wheelchair propulsion/positioning PT Transfers Anticipated Outcome(s): Min A  PT Locomotion Anticipated Outcome(s): Min A x50' with LRAD PT Recommendation Follow Up Recommendations: Home health PT;Outpatient PT;24 hour supervision/assistance Patient destination: Home Equipment Recommended: Wheelchair (measurements);Wheelchair cushion (measurements);To be determined (Pt has RW, may need WC) Equipment Details: 18x18 likely  Skilled Therapeutic Intervention Pt tx initiated upon eval. Pt oriented to unit, call bell, PT POC, and principles of rehab. Pt was educated on fall risk reduction and precautions. Pt instructed in transfer training for basic squat pivot transfer, sit<>stand with Clarise Cruz Plus, sitting balance, WC propulsion and parts management, therex, and gait with +2 and Sara Plus x8'. Pt required up to Mod A for bed mobility, Max A for transfers, and was limited by motor impersistence and fatigue. Pt instructed in supine and seated therex with rest breaks for fatigue: SAQ, ankle  pumps, bridging, heel slides, hip ABD/ADD, marching and shoulder presses 2x5 each with cue sfor technique. Pt left up in [facility] with all needs in reach.    PT Evaluation Precautions/Restrictions Precautions Precautions: Fall Restrictions Weight Bearing Restrictions: No General   Vital SignsTherapy Vitals Pulse Rate: (!) 107 (after gait) BP: 119/60 Patient Position (if appropriate): Sitting Pain Pain Assessment Pain Assessment: No/denies pain Home Living/Prior Functioning Home Living Available Help at Discharge: Family;Available 24 hours/day Type of Home: House Home Access: Stairs to enter [employer] of Steps: 5 Entrance Stairs-Rails: Right;Left Home Layout: Two level;Able to live on main level with bedroom/bathroom Alternate Level Stairs-Number of Steps: flight Bathroom Shower/Tub: Multimedia programmer: Handicapped height Bathroom Accessibility: Yes Additional Comments: Has 4WW, RW, shower chair, transport chair  Lives With: Spouse Prior Function Level of Independence: Requires assistive device for independence;Independent with basic ADLs;Independent with gait;Independent with transfers (Began needing device 4-5 months ago, otherwise Indep and act)  Able to Take Stairs?: Yes Driving: Yes Vocation: Part time employment Comments: Patient works part-time at his company (desk job); used to be race Teacher, early years/pre - History Baseline Vision: Wears glasses all the time Patient Visual Report: No change from baseline Vision - Assessment Eye Alignment: Within Functional Limits Ocular Range of Motion: Within Functional Limits Alignment/Gaze Preference: Within Defined Limits Tracking/Visual Pursuits: Able to track stimulus in all quads without difficulty Convergence: Within functional limits Perception Perception: Within Functional Limits Praxis Praxis: Intact  Cognition Overall Cognitive Status: Within Functional Limits for tasks  assessed Orientation Level: Oriented X4 Sensation Sensation Light Touch: Appears Intact Coordination Gross Motor Movements are Fluid and Coordinated: No Fine Motor Movements are Fluid and Coordinated: No Coordination and Movement Description: Decreased excursion due to weakness Heel Shin Test: limited by weakness, only able to perform 1/4-1/2 movement Motor  Motor Motor: Motor impersistence Motor - Skilled Clinical Observations: Motor control wanes quickly   Mobility Bed Mobility Bed Mobility: Supine to Sit;Sit to Supine Supine to Sit: 3: Mod assist Sit to Supine: 3: Mod assist Transfers Transfers: Yes Stand Pivot Transfers: 2: Max assist Stand Pivot Transfer Details: Tactile  cues for initiation;Visual cues for safe use of DME/AE;Visual cues/gestures for precautions/safety;Verbal cues for sequencing;Verbal cues for technique;Manual facilitation for weight shifting;Manual facilitation for weight bearing;Manual facilitation for placement Locomotion  Ambulation Ambulation: Yes Ambulation/Gait Assistance: 1: +2 Total assist Assistive device: Body weight support system;Other (Comment) Clarise Cruz Plus) Ambulation/Gait Assistance Details: Tactile cues for initiation;Tactile cues for sequencing;Tactile cues for weight shifting;Visual cues for safe use of DME/AE;Visual cues/gestures for precautions/safety;Verbal cues for sequencing;Verbal cues for technique;Verbal cues for precautions/safety;Verbal cues for gait pattern;Verbal cues for safe use of DME/AE;Manual facilitation for weight shifting;Manual facilitation for placement;Manual facilitation for weight bearing Stairs / Additional Locomotion Stairs: No Wheelchair Mobility Wheelchair Mobility: Yes Wheelchair Assistance: 5: Supervision Wheelchair Assistance Details: Verbal cues for sequencing;Verbal cues for Marketing executive: Both upper extremities Wheelchair Parts Management: Needs assistance  Trunk/Postural Assessment   Cervical Assessment Cervical Assessment: Within Functional Limits (Pt reports "Spinalitis" ) Thoracic Assessment Thoracic Assessment: Within Functional Limits Lumbar Assessment Lumbar Assessment: Exceptions to [facility] Lumbar AROM Overall Lumbar AROM Comments: Posterior eplvic tilt Postural Control Postural Control: Deficits on evaluation Righting Reactions: Insufficient, relies on UE's for control  Balance Balance Balance Assessed: Yes Dynamic Sitting Balance Dynamic Sitting - Balance Support: Bilateral upper extremity supported;Feet supported Dynamic Sitting - Level of Assistance: 4: Min assist Static Standing Balance Static Standing - Balance Support: Bilateral upper extremity supported;During functional activity Static Standing - Level of Assistance: 2: Max assist Static Standing - Comment/# of Minutes: 10 seconds with max lifting and lowering assist Dynamic Standing Balance Dynamic Standing - Balance Support: Bilateral upper extremity supported;During functional activity Dynamic Standing - Level of Assistance: 2: Max assist Extremity Assessment  RUE Assessment RUE Assessment: Exceptions to [facility] RUE Strength RUE Overall Strength Comments: Grossly 3+/5 assessed functionally LUE Assessment LUE Assessment: Exceptions to [facility] LUE Strength LUE Overall Strength Comments: Grossly 3+/5 throughout assessed functionally, ROM limited due to old fx RLE Assessment RLE Assessment: Exceptions to [facility] RLE Strength RLE Overall Strength Comments: 2+/5 throughout hips and knees, DF 3/5 - HS tightness, impairing LE ext and pelvic motion LLE Assessment LLE Assessment: Exceptions to [facility] (2+/5 throughout, 3/5 DF)   See Function Navigator for Current Functional Status.   Refer to Care Plan for Long Term Goals  Recommendations for other services: None   Discharge Criteria: Patient will be discharged from PT if patient refuses treatment 3 consecutive times without medical reason, if treatment  goals not met, if there is a change in medical status, if patient makes no progress towards goals or if patient is discharged from hospital.  The above assessment, treatment plan, treatment alternatives and goals were discussed and mutually agreed upon: by patient  Kayson Tasker, Corinna Lines, PT, DPT  11/24/2016, 12:19 PM

## 2016-11-25 ENCOUNTER — Inpatient Hospital Stay (HOSPITAL_COMMUNITY): Payer: Medicare Other | Admitting: Occupational Therapy

## 2016-11-25 ENCOUNTER — Inpatient Hospital Stay (HOSPITAL_COMMUNITY): Payer: Medicare Other

## 2016-11-25 DIAGNOSIS — M7989 Other specified soft tissue disorders: Secondary | ICD-10-CM

## 2016-11-25 NOTE — Progress Notes (Signed)
[place] PHYSICAL MEDICINE & REHABILITATION     PROGRESS NOTE    Subjective/Complaints: No new complaints. Feels well. Felt that therapy was quite productive yesterday.   ROS: pt denies nausea, vomiting, diarrhea, cough, shortness of breath or chest pain     Objective: Vital Signs: Blood pressure (!) 111/58, pulse (!) 108, temperature 97.8 F (36.6 C), temperature source Oral, resp. rate 18, height 6' (1.829 m), weight 74.9 kg (165 lb 2 oz), SpO2 100 %. No results found.  Recent Labs  11/23/16 1405  WBC 10.9*  HGB 12.9*  HCT 38.5*  PLT 491*    Recent Labs  11/23/16 1405  CREATININE 0.51*   CBG (last 3)  No results for input(s): GLUCAP in the last 72 hours.  Wt Readings from Last 3 Encounters:  11/23/16 74.9 kg (165 lb 2 oz)  11/22/16 67.6 kg (149 lb 0.5 oz)  10/12/16 74.8 kg (164 lb 12.8 oz)    Physical Exam:  HENT:  Head: Normocephalicand atraumatic.  Eyes: EOMare normal. Left eye exhibits no discharge.  Neck: Normal range of motion. Neck supple. No thyromegalypresent.  Cardiovascular:RRR without murmur. No JVD    Respiratory: CTA Bilaterally without wheezes or rales. Normal effort    GI: Soft. Bowel sounds are normal. He exhibits no distension. There is no tenderness.  Musculoskeletal: He exhibits no edemaor deformity.  Psychiatric: pleasant and cooperative Skin. Warm and dry Neurological:  Pt alert and oriented. Resting>intentional tremor. Persistent, mild cogwheeling of arms >legs with PROM. Flat/masked facies. RUE 4/5prox to distal, LUE 4- to 4/5. RLE: 2+HF, 2+ to 3- KE and 3+/5 ADF/PF. LLE 2/5 HF, 2+ KE and 3/5 ADF/PFDTR's tr to 1+, normal pain and light touch sense in all 4---motor and sensory exam stable  Assessment/Plan: 1. 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 Physiatrist is providing close team supervision and 24 hour management of  active medical problems listed below. Physiatrist and rehab team continue to assess barriers to discharge/monitor patient progress toward functional and medical goals.  Function:  Bathing Bathing position   Position: Shower  Bathing parts Body parts bathed by patient: Right arm, Left arm, Chest, Abdomen, Front perineal area, Right upper leg, Left upper leg Body parts bathed by helper: Right lower leg, Left lower leg, Buttocks, Back  Bathing assist        Upper Body Dressing/Undressing Upper body dressing   What is the patient wearing?: Pull over shirt/dress     Pull over shirt/dress - Perfomed by patient: Thread/unthread right sleeve, Thread/unthread left sleeve, Put head through opening, Pull shirt over trunk          Upper body assist        Lower Body Dressing/Undressing Lower body dressing   What is the patient wearing?: Pants, Ted Hose, Shoes     Pants- Performed by patient: Thread/unthread left pants leg Pants- Performed by helper: Thread/unthread right pants leg, Pull pants up/down           Shoes - Performed by helper: Don/doff right shoe, Don/doff left shoe       TED Hose - Performed by helper: Don/doff right TED hose, Don/doff left TED hose  Lower body assist Assist for lower body dressing: 2 Helpers      Toileting Toileting     Toileting steps completed by helper: Adjust clothing prior to toileting, Performs perineal hygiene, Adjust clothing after toileting    Toileting assist Assist  level: Two helpers   Transfers Chair/bed transfer   Chair/bed transfer method: Squat pivot Chair/bed transfer assist level: Maximal assist (Pt 25 - 49%/lift and lower)       Locomotion Ambulation     Max distance: 8 Assist level: 2 helpers   Wheelchair   Type: Manual Max wheelchair distance: 50 Assist Level: Supervision or verbal cues  Cognition Comprehension Comprehension assist level: Follows complex conversation/direction with no assist  Expression  Expression assist level: Expresses complex ideas: With no assist  Social Interaction Social Interaction assist level: Interacts appropriately with others - No medications needed.  Problem Solving Problem solving assist level: Solves complex 90% of the time/cues < 10% of the time  Memory Memory assist level: Complete Independence: No helper   Medical Problem List and Plan: 1. 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 -continue therapies 2. DVT Prophylaxis/Anticoagulation: Subcutaneous Lovenox. Monitor platelet counts and any signs of bleeding. Check vascular studies 3. Pain Management: Ultram 50 mg every 6 hours as needed, Zanaflex 2 mg every 8 hours as needed 4. Mood: Provide emotional support 5. Neuropsych: This patient iscapable of making decisions on hisown behalf. 6. Skin/Wound Care: Routine skin checks 7. Fluids/Electrolytes/Nutrition: encourage PO 8.Hypertension. Lisinopril 10 mg daily, Toprol 25 mg daily. Monitor with increased mobility 9.History of prostate cancer status post prostatectomy. CT chest abdomen and pelvis showed no findings typical for metastatic prostate cancer 10.Constipation. Laxative assistance  LOS (Days) 2 A FACE TO FACE EVALUATION WAS PERFORMED  Meredith Staggers, MD 11/25/2016 9:21 AM

## 2016-11-25 NOTE — Progress Notes (Signed)
BP 78/50 manual check, HR 110. Patient feels dizzy and "distant." Had recently completed therapy session. Returned patient to bed. MD notified, ordered to push fluids and rest remainder of day. Will continue to monitor.

## 2016-11-25 NOTE — Progress Notes (Signed)
Occupational Therapy Session Note  Patient Details  Name: Eduardo Burch MRN: [id number] Date of Birth: October 04, 1940  Today's Date: 11/25/2016 OT Individual Time: 1345-1432 OT Individual Time Calculation (min): 47 min    Short Term Goals: Week 1:  OT Short Term Goal 1 (Week 1): Pt will complete sit to stand during LB dressing with max assist of one person. OT Short Term Goal 2 (Week 1): Pt will perform squat pivot transfers to drop arm commode with mod assist.  OT Short Term Goal 3 (Week 1): Pt will donn pants, socks, and shoes over feet with supervision in sitting and use of AE PRN.   Skilled Therapeutic Interventions/Progress Updates:    Pt transferred from supine to sit EOB with mod assist.  Once sitting he was able to work on donning his sweat pants, but needed mod assist from therapist to thread them over his LEs.  Steady utilized for sit to stand and standing with total assist needed, in order to pull sweat pants over hips.  Once this was finished, he was able to transfer to the wheelchair with use of the sliding board and min assist.  Pt propelled his wheelchair down to the dayroom for use of the UE ergonometer in order to increase UE strength.  He was able to complete 2 sets of 4 minutes with resistance on level 10 and RPMs maintained at 24-30.  Next had pt use 1 lb dowel rod for 2 sets of 10 shoulder flexion.  He needed min assist to complete the final 2 reps for each set.  Also incorporated ball toss, having pt hit the ball back with BUEs while holding onto the dowel rod.  Finished session with return to room and pt sitting up in his wheelchair per request.  Call button and phone in reach.    Therapy Documentation Precautions:  Precautions Precautions: Fall Restrictions Weight Bearing Restrictions: No  Pain: Pain Assessment Pain Assessment: No/denies pain ADL: See Function Navigator for Current Functional Status.   Therapy/Group: Individual Therapy  Holiday Mcmenamin  OTR/L 11/25/2016, 4:00 PM

## 2016-11-26 ENCOUNTER — Inpatient Hospital Stay (HOSPITAL_COMMUNITY): Payer: Medicare Other | Admitting: Occupational Therapy

## 2016-11-26 ENCOUNTER — Inpatient Hospital Stay (HOSPITAL_COMMUNITY): Payer: Medicare Other | Admitting: Physical Therapy

## 2016-11-26 ENCOUNTER — Ambulatory Visit: Payer: Medicare Other | Admitting: Physical Therapy

## 2016-11-26 DIAGNOSIS — E876 Hypokalemia: Secondary | ICD-10-CM

## 2016-11-26 DIAGNOSIS — I952 Hypotension due to drugs: Secondary | ICD-10-CM

## 2016-11-26 LAB — CBC WITH DIFFERENTIAL/PLATELET
Basophils Absolute: 0 10*3/uL (ref 0.0–0.1)
Basophils Relative: 0 %
EOS ABS: 0.2 10*3/uL (ref 0.0–0.7)
EOS PCT: 2 %
HCT: 36.1 % — ABNORMAL LOW (ref 39.0–52.0)
Hemoglobin: 11.9 g/dL — ABNORMAL LOW (ref 13.0–17.0)
LYMPHS ABS: 2.1 10*3/uL (ref 0.7–4.0)
LYMPHS PCT: 25 %
MCH: 32.3 pg (ref 26.0–34.0)
MCHC: 33 g/dL (ref 30.0–36.0)
MCV: 98.1 fL (ref 78.0–100.0)
MONO ABS: 0.8 10*3/uL (ref 0.1–1.0)
MONOS PCT: 10 %
Neutro Abs: 5.3 10*3/uL (ref 1.7–7.7)
Neutrophils Relative %: 63 %
PLATELETS: 485 10*3/uL — AB (ref 150–400)
RBC: 3.68 MIL/uL — AB (ref 4.22–5.81)
RDW: 15.2 % (ref 11.5–15.5)
WBC: 8.4 10*3/uL (ref 4.0–10.5)

## 2016-11-26 LAB — COMPREHENSIVE METABOLIC PANEL
ALT: 15 U/L — AB (ref 17–63)
ANION GAP: 6 (ref 5–15)
AST: 20 U/L (ref 15–41)
Albumin: 2.6 g/dL — ABNORMAL LOW (ref 3.5–5.0)
Alkaline Phosphatase: 40 U/L (ref 38–126)
BUN: 13 mg/dL (ref 6–20)
CHLORIDE: 107 mmol/L (ref 101–111)
CO2: 28 mmol/L (ref 22–32)
CREATININE: 0.49 mg/dL — AB (ref 0.61–1.24)
Calcium: 7.9 mg/dL — ABNORMAL LOW (ref 8.9–10.3)
Glucose, Bld: 95 mg/dL (ref 65–99)
Potassium: 3.2 mmol/L — ABNORMAL LOW (ref 3.5–5.1)
SODIUM: 141 mmol/L (ref 135–145)
Total Bilirubin: 0.5 mg/dL (ref 0.3–1.2)
Total Protein: 4.3 g/dL — ABNORMAL LOW (ref 6.5–8.1)

## 2016-11-26 MED ORDER — POTASSIUM CHLORIDE CRYS ER 20 MEQ PO TBCR
20.0000 meq | EXTENDED_RELEASE_TABLET | Freq: Two times a day (BID) | ORAL | Status: AC
  Administered 2016-11-26 – 2016-11-27 (×4): 20 meq via ORAL
  Filled 2016-11-26 (×4): qty 1

## 2016-11-26 MED ORDER — METOPROLOL TARTRATE 12.5 MG HALF TABLET
12.5000 mg | ORAL_TABLET | Freq: Two times a day (BID) | ORAL | Status: DC
  Administered 2016-11-26 – 2016-12-15 (×37): 12.5 mg via ORAL
  Filled 2016-11-26 (×39): qty 1

## 2016-11-26 NOTE — Progress Notes (Signed)
Physical Therapy Session Note  Patient Details  Name: Eduardo Burch MRN: [id number] Date of Birth: 1941/02/22  Today's Date: 11/26/2016 PT Individual Time: 0815-0900, 0160-1093 PT Individual Time Calculation (min): 45 min, 30 min  Short Term Goals: Week 1:  PT Short Term Goal 1 (Week 1): Pt will perform bed mobility with Min A  PT Short Term Goal 2 (Week 1): Pt will perform chair<>bed transfer with Mod A  PT Short Term Goal 3 (Week 1): Pt will propell WC x100' with S, performing parts management PT Short Term Goal 4 (Week 1): Pt will stand x2 min with lift equipment PT Short Term Goal 5 (Week 1): Pt will perform gait x25' with lift equipment  Skilled Therapeutic Interventions/Progress Updates:   Session 1: Pt received lying in bed, eating breakfast. Asked to finish breakfast before therapy. Pt supine>sit with HOB elevated and bed rails with min assist for trunk management. Pt bed/chair transfer with max assist +2 and steady. Cues for technique. Pt propelled wheelchair 150 ft to gym with supervision. Mat/chair transfer with steady and max assist + 2. Pt performed 3 sets 10 reps of hip abduction, hip adduction, and LAQ. Cues for technique. Pt only able to complete 2/3 range on LAQ. 2 sets, 15 reps of DF against theraband resistance. Pt sit<>stand from elevated mat x 2 with rollator with min assist + 2. Pt performed pre-gait stepping in place. Pt only able to stand briefly (~10-20 sec) before fatiguing. Pt propelled back to room as above. Left sitting up in wheelchair, all needs in reach.   Session 2: Pt received sitting in wheelchair, agreeable to therapy. Propelled wheelchair 150 ft to gym with supervision. Pt performed mat/chair squat pivot transfer with min assist for weight shift over to mat. Pt performed 1 sit<>stand with max + 2. Able to stand ~5-10 seconds before sitting. Pt feeling fatigued. Pt squat pivot to/from Nustep. Nustep for 5 min on L2 progressed to L4. Pt reports fatigued and ready to  go back to room. Pt propelled 125 ft back to room.  Min assist for sliding board transfer to bed to steady sliding board. Pt mod assist for sit>supine for LE management. Min assist and cues for positioning in bed. Pt left lying in bed, all needs in reach.   Therapy Documentation Precautions:  Precautions Precautions: Fall Restrictions Weight Bearing Restrictions: No General: PT Amount of Missed Time (min): 15 Minutes PT Missed Treatment Reason: Other (Comment) (breakfast)   See Function Navigator for Current Functional Status.   Therapy/Group: Individual Therapy  Gwinda Passe, SPT 11/26/2016, 11:40 AM

## 2016-11-26 NOTE — Progress Notes (Signed)
Social Work  Social Work Assessment and Plan  Patient Details  Name: Eduardo Burch MRN: [id number] Date of Birth: [birth year]  Today's Date: 11/26/2016  Problem List:  Patient Active Problem List   Diagnosis Date Noted  . Ankle pain 11/19/2016  . Hyperbilirubinemia 11/17/2016  . Leukocytosis 11/14/2016  . Hypophosphatemia 11/14/2016  . Dysphagia 11/14/2016  . Abnormal LFTs 11/14/2016  . Lower extremity weakness 11/13/2016  . CIDP (chronic inflammatory demyelinating polyneuropathy) ([place]) 11/12/2016  . Back pain without sciatica 10/25/2016  . 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 Hyperlipidemia 10/03/2016  . H/O prostate cancer 10/03/2016  . Hyponatremia 10/03/2016  . Leg weakness 09/28/2016  . Constipation   . Tachycardia 08/16/2016  . Hypertension, essential 08/15/2016   Past Medical History:  Past Medical History:  Diagnosis Date  . Constipated   . Elevated blood pressure reading   . 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 Hypercholesterolemia   . Hypertension   . Spondylosis    Past Surgical History:  Past Surgical History:  Procedure Laterality Date  . PROATATECTOMY     Social History:  reports that he has quit smoking. His smoking use included Cigarettes. He has never used smokeless tobacco. He reports that he does not drink alcohol or use drugs.  Family / Support Systems Marital Status: Married Patient Roles: Spouse, Parent, Other (Comment) (business owner) Spouse/Significant Other: wife, Eduardo Burch @ (C) [phone] Children: one adult son living in [place] Other Supports: siblings, neices/ nephews in the local area Anticipated Caregiver: Arville Go; may have to hire assist if needed Ability/Limitations of Caregiver: None Caregiver Availability: 24/7 Family Dynamics: Pt described wife as very supportive and "available 99% of the time"  Social History Preferred language: English Religion: Non-Denominational Cultural Background: NA Education:  college Read: Yes Write: Yes Employment 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  He admits much frustration with "missing the right diagnosis" when he was originally admitted, but pleased to now have confirmed diagnosis and treatment plan.  Eager for therapies and optimistic about his recovery.  Denies any significant emotional distress - will monitor and refer for neuropsychology if indicated. Recent Psychosocial Issues: None prior to initial hospital admit Pyschiatric History: None Substance Abuse History: None  Patient / Family Perceptions, Expectations & Goals Pt/Family understanding of illness & functional limitations: Pt and wife with good understanding of his CIDP diagnosis and planned treatment course.  He is aware that he may need to receive treatements as an outpatient dependent on his CIDP course. Premorbid pt/family roles/activities: Pt very active PTA both at home and in the community. Anticipated changes in roles/activities/participation: Wife to continue to be primary caregiver Pt/family expectations/goals: "I want to be able to walk out of here on my own.  Without anything (AD)."  [employer]: None Premorbid Home Care/DME Agencies: Other (Comment) (just a couple of treatments via [facility]) Transportation available at discharge: yes Resource referrals recommended: Neuropsychology  Discharge Planning Living Arrangements: Spouse/significant other Support Systems:  Spouse/significant other, Other relatives, Friends/neighbors Type of Residence: Private residence Insurance Resources: [employer] ([employer] [employer]) Financial Resources:  Social Security Financial Screen Referred: No Living Expenses: Own Money Management: Patient Does the patient have any problems obtaining your medications?: No Home Management: pt and wife Patient/Family Preliminary Plans: Pt to d/c home with wife able to provide very close to 24/7 support. Social Work Anticipated Follow Up Needs: HH/OP Expected length of stay: [facility]OS 14- 18 days  Clinical Impression Pleasant gentleman here following treatment for newly diagnosed CIDP.  Very motivated for therapies and hopeful to be able to "walk out of here".  Wife very encouraging and supportive and notes she can provide close to 24/7 support. Pt denies any significant emotional distress but will monitor.  Follow for support and d/c planning needs.  Eduardo Burch 11/26/2016, 3:48 PM

## 2016-11-26 NOTE — Progress Notes (Signed)
Patient information reviewed and entered into eRehab system by Daryan Cagley, RN, CRRN, PPS Coordinator.  Information including medical coding and functional independence measure will be reviewed and updated through discharge.     Per nursing patient was given "Data Collection Information Summary for Patients in Inpatient Rehabilitation Facilities with attached "Privacy Act Statement-Health Care Records" upon admission.  

## 2016-11-26 NOTE — Progress Notes (Signed)
Occupational Therapy Session Note  Patient Details  Name: Eduardo Burch MRN: [id number] Date of Birth: [birth year]  Today's Date: 11/26/2016 OT Individual Time: 1030-1200 OT Individual Time Calculation (min): 90 min    Short Term Goals: Week 1:  OT Short Term Goal 1 (Week 1): Pt will complete sit to stand during LB dressing with max assist of one person. OT Short Term Goal 2 (Week 1): Pt will perform squat pivot transfers to drop arm commode with mod assist.  OT Short Term Goal 3 (Week 1): Pt will donn pants, socks, and shoes over feet with supervision in sitting and use of AE PRN.   Skilled Therapeutic Interventions/Progress Updates:    Pt seen this session to facilitate LE strength needed to be more independent with his self care. Pt received in his room already dressed. He stated he washed up well earlier and did not want to shower today.  Pt was eager to work on strengthening exercises.   Pt self propelled chair to ortho gym. Attempted a sit to stand from w/c to his rollator. Pt was able to use B arms to elevate hips but he did not feel secure when trying to transfer R hand to walker handle.  Pt instead used board to transfer to mat with close S and set up to place board and stabilize it.   On mat pt moved into sidelying on R:  Small hip abd with clam shell exercise of one in abd 10x 3, followed by hamstring stretch In supine:  Oblique activation with knee drops with knees bent, feet on mat - a/arom of knee extension with legs on bolster - hip flexion with partial knee extension for hamstring stretches - active hip adduction with ball squeezes -small hip bridges as pt can only elevate hips less than 1 inch Side lying on L with same hip abd exercises  Pt needed several short rest breaks throughout his long session.   Pt sat to edge of mat and transferred back to w/c.  Pt stated he prefers side sleeping. Demonstrated to pt in his room how he can adjust his bed controls to make bed flat so he  can sleep on his side with a pillow between his knees.  Pt in room with all needs met.  Therapy Documentation Precautions:  Precautions Precautions: Fall Restrictions Weight Bearing Restrictions: No  Pain: Pain Assessment Pain Assessment: No/denies pain ADL:    See Function Navigator for Current Functional Status.   Therapy/Group: Individual Therapy  [place] 11/26/2016, 12:12 PM

## 2016-11-26 NOTE — Progress Notes (Signed)
Occupational Therapy Session Note  Patient Details  Name: Eduardo Burch MRN: [id number] Date of Birth: 05-07-1940  Today's Date: 11/26/2016 OT Individual Time: 1400-1430 OT Individual Time Calculation (min): 30 min    Short Term Goals: Week 1:  OT Short Term Goal 1 (Week 1): Pt will complete sit to stand during LB dressing with max assist of one person. OT Short Term Goal 2 (Week 1): Pt will perform squat pivot transfers to drop arm commode with mod assist.  OT Short Term Goal 3 (Week 1): Pt will donn pants, socks, and shoes over feet with supervision in sitting and use of AE PRN.   Skilled Therapeutic Interventions/Progress Updates:    Pt seen for OT session focusing on functional transfer training. Pt in supine upon arrival, agreeable to tx session. Min A and VCs for bed mobility technique to transfer to EOB.  He completed transfers throughout session via sliding board with assist for set-up, guarding assist and min cuing for technique. Completed EOB>w/c,and  w/c <> drop arm BSC. Pt declined practicing clothing management, however, education and demonstration provided for use of lateral leans to complete clothing management. Pt voiced thinking this wouldn't be a problem. He then self propelled w/c at ADL apartment. Completed w/c <> low soft surface couch transfer via sliding board. Min A to couch, however, required mod- max A to transfer back to w/c due to unevenness of transfer and from low surface. Pt returned to room at end of session, left seated in w/c with all needs in reach.   Therapy Documentation Precautions:  Precautions Precautions: Fall Restrictions Weight Bearing Restrictions: No Pain:   No/ denies pain  See Function Navigator for Current Functional Status.   Therapy/Group: Individual Therapy  Lewis, Issai Werling C 11/26/2016, 7:25 AM

## 2016-11-26 NOTE — Progress Notes (Signed)
[place] PHYSICAL MEDICINE & REHABILITATION     PROGRESS NOTE    Subjective/Complaints: Called by RN yesterday afternoon with SBP in 70's. Pt felt light-headed and weak. It was after therapy. Feels fine this morning.   ROS: pt denies nausea, vomiting, diarrhea, cough, shortness of breath or chest pain      Objective: Vital Signs: Blood pressure 139/75, pulse 91, temperature (!) 97.4 F (36.3 C), temperature source Oral, resp. rate 18, height 6' (1.829 m), weight 74.9 kg (165 lb 2 oz), SpO2 99 %. No results found.  Recent Labs  11/23/16 1405 11/26/16 0608  WBC 10.9* 8.4  HGB 12.9* 11.9*  HCT 38.5* 36.1*  PLT 491* 485*    Recent Labs  11/23/16 1405 11/26/16 0608  NA  --  141  K  --  3.2*  CL  --  107  GLUCOSE  --  95  BUN  --  13  CREATININE 0.51* 0.49*  CALCIUM  --  7.9*   CBG (last 3)  No results for input(s): GLUCAP in the last 72 hours.  Wt Readings from Last 3 Encounters:  11/23/16 74.9 kg (165 lb 2 oz)  11/22/16 67.6 kg (149 lb 0.5 oz)  10/12/16 74.8 kg (164 lb 12.8 oz)    Physical Exam:  HENT:  Head: Normocephalicand atraumatic.  Eyes: EOMare normal. Left eye exhibits no discharge.  Neck: Normal range of motion. Neck supple. No thyromegalypresent.  Cardiovascular:RRR without murmur. No JVD    Respiratory: CTA Bilaterally without wheezes or rales. Normal effort   GI: Soft. Bowel sounds are normal. He exhibits no distension. There is no tenderness.  Musculoskeletal: He exhibits no edemaor deformity.  Psychiatric: pleasant and cooperative Skin. Warm and dry Neurological:  Pt alert and oriented. Resting>intentional tremor. Persistent, mild cogwheeling of arms >legs with PROM. Flat/masked facies. RUE 4/5prox to distal, LUE 4- to 4/5. RLE: 2+HF, 2+ to 3- KE and 3+/5 ADF/PF. LLE 2/5 HF, 2+ KE and 3/5 ADF/PFDTR's tr to 1+, normal pain and light touch sense in all 4--- no motor changes  Assessment/Plan: 1. 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 Physiatrist is providing close team supervision and 24 hour management of active medical problems listed below. Physiatrist and rehab team continue to assess barriers to discharge/monitor patient progress toward functional and medical goals.  Function:  Bathing Bathing position   Position: Shower  Bathing parts Body parts bathed by patient: Right arm, Left arm, Chest, Abdomen, Front perineal area, Right upper leg, Left upper leg Body parts bathed by helper: Right lower leg, Left lower leg, Buttocks, Back  Bathing assist        Upper Body Dressing/Undressing Upper body dressing   What is the patient wearing?: Pull over shirt/dress     Pull over shirt/dress - Perfomed by patient: Thread/unthread right sleeve, Thread/unthread left sleeve, Put head through opening, Pull shirt over trunk          Upper body assist        Lower Body Dressing/Undressing Lower body dressing   What is the patient wearing?: Pants, Ted Hose, Shoes     Pants- Performed by patient: Thread/unthread left pants leg Pants- Performed by helper: Thread/unthread right pants leg, Pull pants up/down           Shoes - Performed by helper: Don/doff right shoe, Don/doff left shoe       TED Hose - Performed by helper:  Don/doff right TED hose, Don/doff left TED hose  Lower body assist Assist for lower body dressing: 2 Helpers      Toileting Toileting     Toileting steps completed by helper: Adjust clothing prior to toileting, Performs perineal hygiene, Adjust clothing after toileting    Toileting assist Assist level: Two helpers   Transfers Chair/bed transfer   Chair/bed transfer method: Lateral scoot Chair/bed transfer assist level: Touching or steadying assistance (Pt > 75%) Chair/bed transfer assistive device: Sliding board     Locomotion Ambulation     Max distance: 8 Assist level: 2  helpers   Wheelchair   Type: Manual Max wheelchair distance: 50 Assist Level: Supervision or verbal cues  Cognition Comprehension Comprehension assist level: Follows complex conversation/direction with no assist  Expression Expression assist level: Expresses complex ideas: With no assist  Social Interaction Social Interaction assist level: Interacts appropriately with others - No medications needed.  Problem Solving Problem solving assist level: Solves complex 90% of the time/cues < 10% of the time  Memory Memory assist level: Complete Independence: No helper   Medical Problem List and Plan: 1. 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  2. DVT Prophylaxis/Anticoagulation: Subcutaneous Lovenox. Monitor platelet counts and any signs of bleeding. Check vascular studies 3. Pain Management: Ultram 50 mg every 6 hours as needed, Zanaflex 2 mg every 8 hours as needed 4. Mood: Provide emotional support 5. Neuropsych: This patient iscapable of making decisions on hisown behalf. 6. Skin/Wound Care: Routine skin checks 7. Fluids/Electrolytes/Nutrition:   -I personally reviewed the patient's labs today.    -replace potassium 8.Hypertension. Lisinopril 10 mg daily, Toprol 25 mg daily.   -significant symptomatic hypotension yesterday  -hold lisinopril for now  -KHT  -encourage fluids 9.History of prostate cancer status post prostatectomy. CT chest abdomen and pelvis showed no findings typical for metastatic prostate cancer 10.Constipation. Laxative assistance with results  LOS (Days) 3 A Sallis T, MD 11/26/2016 8:52 AM

## 2016-11-26 NOTE — IPOC Note (Signed)
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) [facility])  Hospital Problems: Principal Problem:   CIDP (chronic inflammatory demyelinating polyneuropathy) ([place]) Active Problems:   H/O prostate cancer     Functional Problem List: Nursing Bowel, Endurance, Motor, Medication Management, Safety  PT Balance, Endurance, Motor, Safety  OT Balance, Endurance, Motor  SLP    TR         Basic ADL's: OT Grooming, Bathing, Dressing, Toileting, Eating     Advanced  ADL's: OT       Transfers: PT Bed Mobility, Bed to Chair, Car, Manufacturing systems engineer, Metallurgist: PT Ambulation, Emergency planning/management officer, Stairs     Additional Impairments: OT Fuctional Use of Upper Extremity  SLP        TR      Anticipated Outcomes Item Anticipated Outcome  Self Feeding modified independent  Swallowing      Basic self-care  min assist level  Toileting  min assist level   Bathroom Transfers min assist level  Bowel/Bladder  Min assist  Transfers  Min A   Locomotion  Min A x50' with LRAD  Communication     Cognition     Pain  < 3  Safety/Judgment  Mod I   Therapy Plan: PT Intensity: Minimum of 1-2 x/day ,45 to 90 minutes PT Frequency: 5 out of 7 days PT Duration Estimated Length of Stay: 17-21 days OT Intensity: Minimum of 1-2 x/day, 45 to 90 minutes OT Frequency: 5 out of 7 days OT Duration/Estimated Length of Stay: 23-25 days      Team Interventions: Nursing Interventions Patient/Family Education, Bowel Management, Medication Management, Disease Management/Prevention  PT interventions Ambulation/gait training, Training and development officer, Community reintegration, Discharge planning, Disease management/prevention, DME/adaptive equipment instruction, Functional mobility training, Neuromuscular re-education, Pain management, Patient/family  education, Psychosocial support, Splinting/orthotics, Stair training, Therapeutic Activities, Therapeutic Exercise, UE/LE Strength taining/ROM, UE/LE Coordination activities, Wheelchair propulsion/positioning  OT Interventions Training and development officer, Academic librarian, Engineer, drilling, Neuromuscular re-education, UE/LE Strength taining/ROM, Therapeutic Exercise, Patient/family education, Functional mobility training, Disease mangement/prevention, Discharge planning, Pain management, Self Care/advanced ADL retraining, Therapeutic Activities, UE/LE Coordination activities  SLP Interventions    TR Interventions    SW/CM Interventions Discharge Planning, Psychosocial Support, Patient/Family Education   Barriers to Discharge MD  Medical stability  Nursing      PT Inaccessible home environment Will likely need ramp built  OT      SLP      SW       Team Discharge Planning: Destination: PT-Home ,OT- Home , SLP-  Projected Follow-up: PT-Home health PT, Outpatient PT, 24 hour supervision/assistance, OT-  24 hour supervision/assistance, Home health OT, SLP-  Projected Equipment Needs: PT-Wheelchair (measurements), Wheelchair cushion (measurements), To be determined (Pt has RW, may need WC), OT- 3 in 1 bedside comode, Tub/shower bench, Wheelchair (measurements), Wheelchair cushion (measurements), SLP-  Equipment Details: PT-18x18 likely, OT-  Patient/family involved in discharge planning: PT- Patient,  OT-Patient, SLP-   MD ELOS: 18-21 days Medical Rehab Prognosis:  Excellent Assessment: The patient has been admitted for CIR therapies with the diagnosis of CIDP. The team will be addressing functional mobility, strength, stamina, balance, safety, adaptive techniques and equipment, self-care, bowel and bladder mgt, patient and caregiver education, pain mgt, NMR, orthotic/wheelchair assessment, community reintegration. Goals have been set at min assist to supervision with  basic mobility and self-care.    Meredith Staggers, MD, Mellody Drown  See Team Conference Notes for weekly updates to the plan of care

## 2016-11-27 ENCOUNTER — Inpatient Hospital Stay (HOSPITAL_COMMUNITY): Payer: Medicare Other | Admitting: Occupational Therapy

## 2016-11-27 ENCOUNTER — Inpatient Hospital Stay (HOSPITAL_COMMUNITY): Payer: Medicare Other | Admitting: Physical Therapy

## 2016-11-27 MED ORDER — TRAZODONE HCL 50 MG PO TABS
50.0000 mg | ORAL_TABLET | Freq: Every day | ORAL | Status: DC
  Administered 2016-11-27 – 2016-12-14 (×18): 50 mg via ORAL
  Filled 2016-11-27 (×18): qty 1

## 2016-11-27 MED ORDER — TRAZODONE HCL 50 MG PO TABS: 50.0000 mg | ORAL_TABLET | Freq: Every evening | ORAL | Status: DC | PRN

## 2016-11-27 NOTE — Progress Notes (Signed)
Occupational Therapy Session Note  Patient Details  Name: Eduardo Burch MRN: [id number] Date of Birth: 1940/04/29  Today's Date: 11/27/2016 OT Individual Time: 1415-1526 OT Individual Time Calculation (min): 71 min   Short Term Goals: Week 1:  OT Short Term Goal 1 (Week 1): Pt will complete sit to stand during LB dressing with max assist of one person. OT Short Term Goal 2 (Week 1): Pt will perform squat pivot transfers to drop arm commode with mod assist.  OT Short Term Goal 3 (Week 1): Pt will donn pants, socks, and shoes over feet with supervision in sitting and use of AE PRN.   Skilled Therapeutic Interventions/Progress Updates:    OT treatment session focused on improved sit<>stand, standing balance/endurance, and LB strengthening. Sit<>stand in standing frame x 5 with Max A lift and lower from therapist. Pt with heavy use of UE's to maintain standing. Facilitated anterior weight shift at hips and incorporated reaching activity, then pipe tree puzzle. Pt needed increased assistance to maintain anterior weight shift when using B UEs to place pipes. Pt tolerated 3 mins standing at longest bout. Pt then propelled wc to therapy gym and completed squat-pivot transfer with mod A to pivot. Sidelying to semi-reclined with assistance to lift B UEs while trunk supported on wedge. Pt completed 3 sets x 10 reps on each leg of short arc quad, hip flexion with heel slide, and hip abduction/adduction. Pt transferred back to wc with slideboard 2/2 fatigue with set-up A and close supervision. Pt propelled wc back to room and transferred back to bed in similar fashion as above. Assistance with LEs to return to supine. Pt left with needs met and family present.   Therapy Documentation Precautions:  Precautions Precautions: Fall Restrictions Weight Bearing Restrictions: No Pain:  none/denies pain See Function Navigator for Current Functional Status.  Therapy/Group: Individual Therapy  Valma Cava 11/27/2016, 2:42 PM

## 2016-11-27 NOTE — Progress Notes (Signed)
[place] PHYSICAL MEDICINE & REHABILITATION     PROGRESS NOTE    Subjective/Complaints: Had difficulty falling asleep last night. Asked for a sleeping med but was told he did not have one ordered. (trazodone 50mg  qhs prn on MAR)  ROS: pt denies nausea, vomiting, diarrhea, cough, shortness of breath or chest pain      Objective: Vital Signs: Blood pressure (!) 159/87, pulse 87, temperature 98.4 F (36.9 C), temperature source Oral, resp. rate 18, height 6' (1.829 m), weight 74.9 kg (165 lb 2 oz), SpO2 98 %. No results found.  Recent Labs  11/26/16 0608  WBC 8.4  HGB 11.9*  HCT 36.1*  PLT 485*    Recent Labs  11/26/16 0608  NA 141  K 3.2*  CL 107  GLUCOSE 95  BUN 13  CREATININE 0.49*  CALCIUM 7.9*   CBG (last 3)  No results for input(s): GLUCAP in the last 72 hours.  Wt Readings from Last 3 Encounters:  11/23/16 74.9 kg (165 lb 2 oz)  11/22/16 67.6 kg (149 lb 0.5 oz)  10/12/16 74.8 kg (164 lb 12.8 oz)    Physical Exam:  HENT:  Head: Normocephalicand atraumatic.  Eyes: EOMare normal. Left eye exhibits no discharge.  Neck: Normal range of motion. Neck supple. No thyromegalypresent.  Cardiovascular: RRR without murmur. No JVD    Respiratory: CTA Bilaterally without wheezes or rales. Normal effort   GI: Soft. Bowel sounds are normal. He exhibits no distension. There is no tenderness.  Musculoskeletal: He exhibits no edemaor deformity.  Psychiatric: pleasant and cooperative Skin. Warm and dry Neurological:  Pt alert and oriented. Resting>intentional tremor.  RUE 4/5prox to distal, LUE 4- to 4/5. RLE: 2+HF, 2+ to 3- KE and 3+/5 ADF/PF. LLE 2/5 HF, 2+ KE and 3/5 ADF/PFDTR's tr to 1+, normal pain and light touch sense in all 4--- motor exam stable   Assessment/Plan: 1. 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 Physiatrist is providing close team  supervision and 24 hour management of active medical problems listed below. Physiatrist and rehab team continue to assess barriers to discharge/monitor patient progress toward functional and medical goals.  Function:  Bathing Bathing position   Position: Shower  Bathing parts Body parts bathed by patient: Right arm, Left arm, Chest, Abdomen, Front perineal area, Right upper leg, Left upper leg Body parts bathed by helper: Right lower leg, Left lower leg, Buttocks, Back  Bathing assist        Upper Body Dressing/Undressing Upper body dressing   What is the patient wearing?: Pull over shirt/dress     Pull over shirt/dress - Perfomed by patient: Thread/unthread right sleeve, Thread/unthread left sleeve, Put head through opening, Pull shirt over trunk          Upper body assist Assist Level: Supervision or verbal cues (per OT note)      Lower Body Dressing/Undressing Lower body dressing   What is the patient wearing?: Pants, Ted Hose, Shoes     Pants- Performed by patient: Thread/unthread left pants leg Pants- Performed by helper: Thread/unthread right pants leg, Pull pants up/down           Shoes - Performed by helper: Don/doff right shoe, Don/doff left shoe       TED Hose - Performed by helper: Don/doff right TED hose, Don/doff left TED hose  Lower body assist Assist for lower body dressing: 2 Helpers  Toileting Toileting     Toileting steps completed by helper: Adjust clothing prior to toileting, Performs perineal hygiene, Adjust clothing after toileting    Toileting assist Assist level: Two helpers   Transfers Chair/bed transfer   Chair/bed transfer method: Stand pivot Chair/bed transfer assist level: 2 helpers Chair/bed transfer assistive device: Mechanical lift Mechanical lift: Ecologist     Max distance: 8 Assist level: 2 helpers   Wheelchair   Type: Manual Max wheelchair distance: 50 Assist Level: Supervision or verbal  cues  Cognition Comprehension Comprehension assist level: Follows complex conversation/direction with no assist  Expression Expression assist level: Expresses complex ideas: With no assist  Social Interaction Social Interaction assist level: Interacts appropriately with others - No medications needed.  Problem Solving Problem solving assist level: Solves complex 90% of the time/cues < 10% of the time  Memory Memory assist level: Complete Independence: No helper   Medical Problem List and Plan: 1. 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  2. DVT Prophylaxis/Anticoagulation: Subcutaneous Lovenox. Monitor platelet counts and any signs of bleeding. Check vascular studies 3. Pain Management: Ultram 50 mg every 6 hours as needed, Zanaflex 2 mg every 8 hours as needed 4. Mood: Provide emotional support  -will schedule trazodone at night 5. Neuropsych: This patient iscapable of making decisions on hisown behalf. 6. Skin/Wound Care: Routine skin checks 7. Fluids/Electrolytes/Nutrition:   -potassium supplementation----recheck Thursday 8.Hypertension: Lisinopril 10 mg daily, Toprol 25 mg daily.   -significant symptomatic hypotension yesterday  -hold lisinopril for now  -KHT   -encourage fluids 9.History of prostate cancer status post prostatectomy. CT chest abdomen and pelvis showed no findings typical for metastatic prostate cancer 10.Constipation. Laxative assistance with results  LOS (Days) 4 A [place] T, MD 11/27/2016 8:57 AM

## 2016-11-27 NOTE — Progress Notes (Signed)
Occupational Therapy Session Note  Patient Details  Name: Eduardo Burch MRN: [id number] Date of Birth: Jul 26, 1940  Today's Date: 11/27/2016 OT Individual Time: 1030-1130 OT Individual Time Calculation (min): 60 min    Short Term Goals: Week 1:  OT Short Term Goal 1 (Week 1): Pt will complete sit to stand during LB dressing with max assist of one person. OT Short Term Goal 2 (Week 1): Pt will perform squat pivot transfers to drop arm commode with mod assist.  OT Short Term Goal 3 (Week 1): Pt will donn pants, socks, and shoes over feet with supervision in sitting and use of AE PRN.   Skilled Therapeutic Interventions/Progress Updates:    Upon entering the room, pt supine in bed with no c/o pain and agreeable to OT intervention. Pt performed slide board transfer with min A from bed > wheelchair with assistance for proper placement of board and wheelchair set up. Pt propelled wheelchair with B UE's to day room. Therapist seated in front of pt with R knee blocked and pt working on anterior weight shift for sit <>stands. Pt attempting standing from this position but unable to remove B hands from arm rest and shift weight forwards to come into full standing. Therapist provided manual support for this action and pt standing fully for 30 seconds before returning to seated position and pt very anxious over new technique. Pt engaged in sit <>stand inside of STEDY x 5 reps but pt again not exhibiting anterior weight shift unless given manual assistance for technique. Pt required max A standing from wheelchair and mod A from STEDY seat. OT provided pt with paper handouts regarding energy conservation and discussed importance for home environment. Pt verbalized understanding. Pt propelled back to room and remained in wheelchair with call bell and all needed items within reach upon exiting the room.   Therapy Documentation Precautions:  Precautions Precautions: Fall Restrictions Weight Bearing Restrictions:  No General:   Vital Signs:  Pain:   ADL:   Vision   Perception    Praxis   Exercises:   Other Treatments:    See Function Navigator for Current Functional Status.   Therapy/Group: Individual Therapy  Gypsy Decant 11/27/2016, 5:10 PM

## 2016-11-27 NOTE — Progress Notes (Signed)
Physical Therapy Session Note  Patient Details  Name: Karin Griffith MRN: [id number] Date of Birth: 1940/12/25  Today's Date: 11/27/2016 PT Individual Time: 0800-0900 PT Individual Time Calculation (min): 60 min   Short Term Goals: Week 1:  PT Short Term Goal 1 (Week 1): Pt will perform bed mobility with Min A  PT Short Term Goal 2 (Week 1): Pt will perform chair<>bed transfer with Mod A  PT Short Term Goal 3 (Week 1): Pt will propell WC x100' with S, performing parts management PT Short Term Goal 4 (Week 1): Pt will stand x2 min with lift equipment PT Short Term Goal 5 (Week 1): Pt will perform gait x25' with lift equipment  Skilled Therapeutic Interventions/Progress Updates:    Pt received sitting up in wheelchair, agreeable to therapy. Pt propelled wheelchair 150 ft to gym with supervision. Pt performed seated hamstring and gastroc stretch bilat x 30 seconds. Required tactile and verbal cues for technique. Manual stretched hamstring and gastroc for second set for better technique. Pt several attempts for sit>stand. Several cues for feet and hand position and forward lean. Max assist +2 to stand. Pt tool ~8 steps in place with min guard. Pt sit<>stand second time with mod assist +2. Ambulated 5 ft with min assist + 2 for wheelchair follow. Pt perfromed 3 sets 10 reps of bilateral LAQ, seated marches, bicep curls, HS curls with theraband, tricep extensions and 3 sets 20 reps of hip abduction with theraband. Pt propelled back to room as above. Advised pt on stretches to do throughout the day. Pt left sitting up in chair, all needs in reach.   Therapy Documentation Precautions:  Precautions Precautions: Fall Restrictions Weight Bearing Restrictions: No   See Function Navigator for Current Functional Status.   Therapy/Group: Individual Therapy  Gwinda Passe, SPT 11/27/2016, 8:22 AM

## 2016-11-27 NOTE — Care Management Note (Signed)
[place] Individual Statement of Services  Patient Name:  Eduardo Burch  Date:  11/27/2016  Welcome to the [facility].  Our goal is to provide you with an individualized program based on your diagnosis and situation, designed to meet your specific needs.  With this comprehensive rehabilitation program, you will be expected to participate in at least 3 hours of rehabilitation therapies Monday-Friday, with modified therapy programming on the weekends.  Your rehabilitation program will include the following services:  Physical Therapy (PT), Occupational Therapy (OT), 24 hour per day rehabilitation nursing, Therapeutic Recreaction (TR), Neuropsychology, Case Management (Social Worker), Rehabilitation Medicine, Nutrition Services and Pharmacy Services  Weekly team conferences will be held on Tuesdays to discuss your progress.  Your Social Worker will talk with you frequently to get your input and to update you on team discussions.  Team conferences with you and your family in attendance may also be held.  Expected length of stay: 21 days  Overall anticipated outcome: minimal assistance  Depending on your progress and recovery, your program may change. Your Social Worker will coordinate services and will keep you informed of any changes. Your Social Worker's name and contact numbers are listed  below.  The following services may also be recommended but are not provided by the [facility] will be made to provide these services after discharge if needed.  Arrangements include referral to agencies that provide these services.  Your insurance has been verified to be:  [facility] Medicare Your primary doctor is:  Lavone Orn  Pertinent information will be shared with your doctor and your insurance company.  Social Worker:  [place], [place] or [phone]   Information discussed with and copy given to patient by: Lennart Pall, 11/27/2016, 4:15 PM

## 2016-11-27 NOTE — Significant Event (Signed)
Pt refused Lovenox injection today. Pt educated on importance of lovenox injections to prevent blood clots and risk associated with not taking this medication. MD notfied of refusal at team conference today. Continue plan of care.

## 2016-11-28 ENCOUNTER — Inpatient Hospital Stay (HOSPITAL_COMMUNITY): Payer: Medicare Other | Admitting: Physical Therapy

## 2016-11-28 ENCOUNTER — Inpatient Hospital Stay (HOSPITAL_COMMUNITY): Payer: Medicare Other | Admitting: Occupational Therapy

## 2016-11-28 DIAGNOSIS — I10 Essential (primary) hypertension: Secondary | ICD-10-CM

## 2016-11-28 MED ORDER — LISINOPRIL 5 MG PO TABS
5.0000 mg | ORAL_TABLET | Freq: Every day | ORAL | Status: DC
  Administered 2016-11-28 – 2016-11-29 (×2): 5 mg via ORAL
  Filled 2016-11-28 (×2): qty 1

## 2016-11-28 NOTE — Progress Notes (Signed)
[facility] PHYSICAL MEDICINE & REHABILITATION     PROGRESS NOTE    Subjective/Complaints: Had difficulty falling asleep last night. Asked for a sleeping med but was told he did not have one ordered. (trazodone 50mg  qhs prn on MAR)  ROS: pt denies nausea, vomiting, diarrhea, cough, shortness of breath or chest pain      Objective: Vital Signs: Blood pressure (!) 160/79, pulse 93, temperature 98.4 F (36.9 C), temperature source Oral, resp. rate 18, height 6' (1.829 m), weight 74.9 kg (165 lb 2 oz), SpO2 99 %. No results found.  Recent Labs  11/26/16 0608  WBC 8.4  HGB 11.9*  HCT 36.1*  PLT 485*    Recent Labs  11/26/16 0608  NA 141  K 3.2*  CL 107  GLUCOSE 95  BUN 13  CREATININE 0.49*  CALCIUM 7.9*   CBG (last 3)  No results for input(s): GLUCAP in the last 72 hours.  Wt Readings from Last 3 Encounters:  11/23/16 74.9 kg (165 lb 2 oz)  11/22/16 67.6 kg (149 lb 0.5 oz)  10/12/16 74.8 kg (164 lb 12.8 oz)    Physical Exam:  HENT:  Head: Normocephalicand atraumatic.  Eyes: EOMare normal. Left eye exhibits no discharge.  Neck: Normal range of motion. Neck supple. No thyromegalypresent.  Cardiovascular: RRR without murmur. No JVD    Respiratory: CTA Bilaterally without wheezes or rales. Normal effort t   GI: Soft. Bowel sounds are normal. He exhibits no distension. There is no tenderness.  Musculoskeletal: He exhibits no edemaor deformity.  Psychiatric: pleasant and cooperative Skin. Warm and dry Neurological:  Pt alert and oriented. Cognitively appropriate  RUE 4/5prox to distal, LUE 4- to 4/5. RLE: 2+HF, 2+ to 3- KE and 3+/5 ADF/PF. LLE 2/5 HF, 2+ KE and 3/5 ADF/PF---no change in motor exam today. DTR's tr to 1+, normal pain and light touch sense in all 4--- motor exam stable   Assessment/Plan: 1. 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 Physiatrist is  providing close team supervision and 24 hour management of active medical problems listed below. Physiatrist and rehab team continue to assess barriers to discharge/monitor patient progress toward functional and medical goals.  Function:  Bathing Bathing position   Position: Shower  Bathing parts Body parts bathed by patient: Right arm, Left arm, Chest, Abdomen, Front perineal area, Right upper leg, Left upper leg Body parts bathed by helper: Right lower leg, Left lower leg, Buttocks, Back  Bathing assist        Upper Body Dressing/Undressing Upper body dressing   What is the patient wearing?: Pull over shirt/dress     Pull over shirt/dress - Perfomed by patient: Thread/unthread right sleeve, Thread/unthread left sleeve, Put head through opening, Pull shirt over trunk          Upper body assist Assist Level: Supervision or verbal cues (per OT note)      Lower Body Dressing/Undressing Lower body dressing   What is the patient wearing?: Pants, Ted Hose, Shoes     Pants- Performed by patient: Thread/unthread left pants leg Pants- Performed by helper: Thread/unthread right pants leg, Pull pants up/down           Shoes - Performed by helper: Don/doff right shoe, Don/doff left shoe       TED Hose - Performed by helper: Don/doff right TED hose, Don/doff left TED hose  Lower body assist Assist for lower body dressing:  2 Helpers      Tree surgeon steps completed by helper: Adjust clothing prior to toileting, Performs perineal hygiene, Adjust clothing after toileting    Toileting assist Assist level: Two helpers   Transfers Chair/bed transfer   Chair/bed transfer method: Lateral scoot Chair/bed transfer assist level: Touching or steadying assistance (Pt > 75%) Chair/bed transfer assistive device: Sliding board Mechanical lift: Stedy   Locomotion Ambulation     Max distance: 5 ft Assist level: Touching or steadying assistance (Pt > 75%) (+ 2  for wheelchair follow )   Wheelchair   Type: Manual Max wheelchair distance: 150 ft Assist Level: Supervision or verbal cues  Cognition Comprehension Comprehension assist level: Follows complex conversation/direction with no assist  Expression Expression assist level: Expresses complex ideas: With no assist  Social Interaction Social Interaction assist level: Interacts appropriately with others - No medications needed.  Problem Solving Problem solving assist level: Solves complex 90% of the time/cues < 10% of the time  Memory Memory assist level: Complete Independence: No helper   Medical Problem List and Plan: 1. 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 -still with significant lower ext weakness 2. DVT Prophylaxis/Anticoagulation: Subcutaneous Lovenox. Reiterated the need for lovenox until mobility improves.  3. Pain Management: Ultram 50 mg every 6 hours as needed, Zanaflex 2 mg every 8 hours as needed 4. Mood: Provide emotional support  -will schedule trazodone at night 5. Neuropsych: This patient iscapable of making decisions on hisown behalf. 6. Skin/Wound Care: Routine skin checks 7. Fluids/Electrolytes/Nutrition:   -potassium supplementation----recheck labs tomorrow 8.Hypertension: Lisinopril 10 mg daily, Toprol 25 mg daily.   -hypotension over weekend but bp trending back up  -resume lisinopril at 5mg  daily  -KHT   -encourage fluids 9.History of prostate cancer status post prostatectomy. CT chest abdomen and pelvis showed no findings typical for metastatic prostate cancer 10.Constipation. Laxative assistance with results  LOS (Days) 5 A [place] T, MD 11/28/2016 9:11 AM

## 2016-11-28 NOTE — Progress Notes (Signed)
Physical Therapy Session Note  Patient Details  Name: Eduardo Burch MRN: [id number] Date of Birth: 02-11-1941  Today's Date: 11/28/2016 PT Individual Time: 0800-0900, 1400-1530 PT Individual Time Calculation (min): 60 min, 90 min  Short Term Goals: Week 1:  PT Short Term Goal 1 (Week 1): Pt will perform bed mobility with Min A  PT Short Term Goal 2 (Week 1): Pt will perform chair<>bed transfer with Mod A  PT Short Term Goal 3 (Week 1): Pt will propell WC x100' with S, performing parts management PT Short Term Goal 4 (Week 1): Pt will stand x2 min with lift equipment PT Short Term Goal 5 (Week 1): Pt will perform gait x25' with lift equipment  Skilled Therapeutic Interventions/Progress Updates:   Session 1: Pt received sitting up in wheelchair, agreeable to therapy. Pt propelled wheelchair 150 ft to gym with supervision. Pt squat pivot to mat with min assist for guiding hips. Pt max assist + 1 for sit<>stand, min guard for stepping in place. Min guard + 2 for ambulation 9 ft, 16 ft, 23 ft, and 27 ft with rest breaks between. Mod assist for sit>supine for LE management. Pt performed 3 sets 15-20 reps of core exercises with knees bent hip abduction. Performed 2 set 10 reps of clam shells. Pt unable to complete full range. 3 sets, 10 reps of glut bridges. Pt supervision for rolling on mat. Pt supine>sit with min assist for trunk control. Pt propelled wheelchair back to room as above. Left sitting up in wheelchair, all needs in reach, wife present.  Session 2: Pt received sitting up in wheelchair, agreeable to therapy. Propelled chair 125 ft to day room with supervision. Pt squat pivot throughout session with min assist to guide hips. Pt performed Nustep L4, 2 min on 1 min rest for a total of 15 min. Pt in tall kneeling with mod assist + 2 for postural control. Pt only tolerated ~2 min of tall kneeling before requesting to stop activity due to feeling "unsteady." Pt performed 3 sets, 10 reps of heel  raises with prolonged rest breaks in between. Pt performed 3 sets of 10 reps of inclined crunches. 2 sets 20 reps of Russian twist core exercise. 2 sets 5 reps of bilat trunk rotation with UE PNF pattern (2lb weight) for core strengthening. Cues for techniques. Pt returned to room total assist for time management and fatigue. Pt left sitting up in wheelchair, all needs in reach.   Therapy Documentation Precautions:  Precautions Precautions: Fall Restrictions Weight Bearing Restrictions: No   See Function Navigator for Current Functional Status.   Therapy/Group: Individual Therapy  Gwinda Passe, SPT 11/28/2016, 8:25 AM

## 2016-11-28 NOTE — Progress Notes (Signed)
Occupational Therapy Session Note  Patient Details  Name: Eduardo Burch MRN: [id number] Date of Birth: [birth year]  Today's Date: 11/28/2016 OT Individual Time: 0930-1030 OT Individual Time Calculation (min): 60 min    Short Term Goals: Week 1:  OT Short Term Goal 1 (Week 1): Pt will complete sit to stand during LB dressing with max assist of one person. OT Short Term Goal 2 (Week 1): Pt will perform squat pivot transfers to drop arm commode with mod assist.  OT Short Term Goal 3 (Week 1): Pt will donn pants, socks, and shoes over feet with supervision in sitting and use of AE PRN.   Skilled Therapeutic Interventions/Progress Updates:    Pt seen for OT session focusing on extensive education with pt and pt's wife. Pt sitting up in w/c upon arrival with wife present. Pt and caregiver with lots of questions regarding home modifications for accessibility as they are re-modeling bathroom. Discussed bathroom and kitchen set-up in regards to accessibility from w/c and RW level. Education provided regarding continuum of care, home modifications, DME, energy conservation, activity progression and d/c planning. Pt then propelled w/c mod I to therapy day room. Completed sliding board transfer to NuStep requiring max cuing for w/c parts management in prep for transfer and guarding assist for transfer. Completed 5 min on NuStep level 3 for general strengthening and conditioning of B UEs/LEs. Education provided regarding breathing techniques during exercise and importance of quality vs quantity of exercises.  Pt returned to w/c at end of session and left propelling around unit mod I in w/c.    Therapy Documentation Precautions:  Precautions Precautions: Fall Restrictions Weight Bearing Restrictions: No Pain:  No/ denies pain  See Function Navigator for Current Functional Status.   Therapy/Group: Individual Therapy  Lewis, Aviyon Hocevar C 11/28/2016, 7:12 AM

## 2016-11-29 ENCOUNTER — Ambulatory Visit: Payer: Medicare Other | Admitting: Physical Therapy

## 2016-11-29 ENCOUNTER — Inpatient Hospital Stay (HOSPITAL_COMMUNITY): Payer: Medicare Other | Admitting: Physical Therapy

## 2016-11-29 ENCOUNTER — Inpatient Hospital Stay (HOSPITAL_COMMUNITY): Payer: Medicare Other

## 2016-11-29 ENCOUNTER — Other Ambulatory Visit: Payer: Self-pay | Admitting: Physician Assistant

## 2016-11-29 MED ORDER — POTASSIUM CHLORIDE CRYS ER 20 MEQ PO TBCR
20.0000 meq | EXTENDED_RELEASE_TABLET | Freq: Every day | ORAL | Status: DC
  Administered 2016-11-29 – 2016-12-03 (×5): 20 meq via ORAL
  Filled 2016-11-29 (×5): qty 1

## 2016-11-29 NOTE — Progress Notes (Signed)
Physical Therapy Session Note  Patient Details  Name: Eduardo Burch MRN: [id number] Date of Birth: 17-May-1940  Today's Date: 11/29/2016 PT Individual [id number], [id number] PT Individual Time Calculation (min): 30 min, 75 min   Short Term Goals: Week 1:  PT Short Term Goal 1 (Week 1): Pt will perform bed mobility with Min A  PT Short Term Goal 2 (Week 1): Pt will perform chair<>bed transfer with Mod A  PT Short Term Goal 3 (Week 1): Pt will propell WC x100' with S, performing parts management PT Short Term Goal 4 (Week 1): Pt will stand x2 min with lift equipment PT Short Term Goal 5 (Week 1): Pt will perform gait x25' with lift equipment  Skilled Therapeutic Interventions/Progress Updates:   Session 1: Pt received sitting up in wheelchair, agreeable to therapy but requested to finish breakfast. Pt propelled wheelchair 150 ft to gym with supervision. Pt max assist + 1 for sit<>stand transfers, max + 2 when fatigued. Pt ambulated 28 ft, 30 ft, and 10 ft with rollator and min assist +2 for wheelchair follow. Pt sit<>supine with min assist for LE and trunk management. Pt supervision for rolling on mat table. 3 sets, 10 reps of bilat clam shells for strengthening hip abductors and glut bridges. Pt stand pivot transfer from mat to wheelchair. Returned to room as above. Left sitting up in wheelchair, all needs in reach.   Session 2: Pt received sitting up in wheelchair, agreeable to therapy. Pt propelled wheelchair 150 ft to gym with supervision. Pt performed 3 sets, 10 reps of heel raises in parallel bars. 2 sets 10 reps of standing hip abduction. Max assist for sit<>stand in parallel bars. Cues for upright posture and exercise technique. Pt close supervision for squat pivot transfers between level surfaces; min assist for uneven surfaces. Performed Nustep L4 for 2 min on, 1 min rest for a total of 6 min. Pt limited by fatigue. Pt sit<>supine with min assist for LE and trunk management. Manual stretching  to bilat hamstrings, gastroc, internal rotators, and external rotators. Pt returned to room total assist. Min assist for squat pivot to bed. Min assist for bed mobility and positioning. Left lying in bed, all needs in reach.   Therapy Documentation Precautions:  Precautions Precautions: Fall Restrictions Weight Bearing Restrictions: No   See Function Navigator for Current Functional Status.   Therapy/Group: Individual Therapy  Gwinda Passe, SPT 11/29/2016, 8:30 AM

## 2016-11-29 NOTE — Patient Care Conference (Signed)
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 Ankle pain 11/19/2016  . Hyperbilirubinemia 11/17/2016  . Leukocytosis 11/14/2016  . Hypophosphatemia 11/14/2016  . Dysphagia 11/14/2016  . Abnormal LFTs 11/14/2016  . Lower extremity weakness 11/13/2016  . CIDP (chronic inflammatory demyelinating polyneuropathy) ([facility]) 11/12/2016  . Back pain without sciatica 10/25/2016  . 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 Hyperlipidemia 10/03/2016  . H/O prostate cancer 10/03/2016  . Hyponatremia 10/03/2016  . Leg weakness 09/28/2016  . Constipation   . Tachycardia 08/16/2016  . Hypertension, essential 08/15/2016    Expected Discharge Date: Expected Discharge Date: 12/15/16  Team Members Present: Physician leading conference: Dr. Alger Simons Social Worker Present: Lennart Pall, LCSW Nurse Present: Leonette Nutting, RN PT Present: Kem Parkinson, PT OT Present: Willeen Cass, OT SLP Present: Weston Anna, SLP PPS Coordinator present : Daiva Nakayama, RN, CRRN     Current Status/Progress Goal Weekly Team Focus  Medical   admitted for motor variant of CIDP. issues with sleep, primarily lower ext weakness  improve activity tolerance  sleep, bp control   Bowel/Bladder   Continent of bowel & bladder, LBM 11/26/16  continue continence  continue to assess & assist as  needed   Swallow/Nutrition/ Hydration             ADL's   bathing-mod A; LB dsg-max A; toilet transfers-max A; toileting-tot A  bathing-min A; UB dsge-S; LB dge-min A; toilet/shower transfers-min A; toileitng-min A  functional transfers, BADL retraining, educaiton, discharge planning, activity tolerance   Mobility   max/mod assist + 2 for transfers; min assist + 2 for ambulation (5 ft), supervision wheelchair mobility   min assist overall; mod I for wheelchair mobility, supervision bed mobility   standing tolerance, transfers, ambulation   Communication             Safety/Cognition/ Behavioral Observations            Pain   no c/o pain, has tylenol & tramadol prn, has not used any  pain scale <2  continue to assess & treat as needed   Skin   slight bruising to the right groin  no new areas of skin break down  assess q shift    Rehab Goals Patient on target to meet rehab goals: Yes *See Care Plan and progress notes for long and short-term goals.     Barriers to Discharge  Current Status/Progress Possible Resolutions Date Resolved   Physician    Medical stability        medical mgt of above      Nursing                  PT                    OT  SLP                SW                Discharge Planning/Teaching Needs:  Plan home with his spouse who "is there 99% of the time" per pt.  Teaching to be planned closer to d/c   Team Discussion:  Cont b/b.  Mod - max +2 for sit-stand.  amb ~ 5' with min assist.  Sliding board tfs supervision but mod/max with uneven level.  Min assist goals overall  Revisions to Treatment Plan:  None    Continued Need for Acute Rehabilitation Level of Care: The patient requires daily medical management by a physician with specialized training in physical medicine and rehabilitation for the following conditions: Daily direction of a multidisciplinary physical rehabilitation program to ensure safe treatment while eliciting the  highest outcome that is of practical value to the patient.: Yes Daily medical management of patient stability for increased activity during participation in an intensive rehabilitation regime.: Yes Daily analysis of laboratory values and/or radiology reports with any subsequent need for medication adjustment of medical intervention for : Neurological problems  Catelynn Sparger 11/29/2016, 12:21 PM

## 2016-11-29 NOTE — Progress Notes (Signed)
Occupational Therapy Session Note  Patient Details  Name: Eduardo Burch MRN: [id number] Date of Birth: 1940/10/02  Today's Date: 11/29/2016 OT Individual Time: 1000-1045 OT Individual Time Calculation (min): 45 min  and Today's Date: 11/29/2016 OT Missed Time: 15 Minutes Missed Time Reason: Patient fatigue   Short Term Goals: Week 1:  OT Short Term Goal 1 (Week 1): Pt will complete sit to stand during LB dressing with max assist of one person. OT Short Term Goal 2 (Week 1): Pt will perform squat pivot transfers to drop arm commode with mod assist.  OT Short Term Goal 3 (Week 1): Pt will donn pants, socks, and shoes over feet with supervision in sitting and use of AE PRN.   Skilled Therapeutic Interventions/Progress Updates:    Pt resting in w/c upon arrival.  Pt denies pain.  Pt declined bathing/dressing tasks stating that he and his wife "took care of that" and he wanted to focus on "therapy."  Pt propelled w/c to gym and performed sit<>stand from w/c with mod A, using Rollator to stand step to mat.  Pt performed sit<>stand from mat at 28" X 3 with mod A.  Pt unable to perform any additional sit<>stand and performed scoot transfer to w/c with supervision.  Pt propelled to room and remained in w/c with all needs within reach.  Pt missed 15 mins skilled OT services 2/2 fatigue.    Therapy Documentation Precautions:  Precautions Precautions: Fall Restrictions Weight Bearing Restrictions: No General: General OT Amount of Missed Time: 15 Minutes  See Function Navigator for Current Functional Status.   Therapy/Group: Individual Therapy  Leroy Libman 11/29/2016, 11:59 AM

## 2016-11-29 NOTE — Progress Notes (Signed)
Physical Therapy Session Note  Patient Details  Name: Eduardo Burch MRN: [id number] Date of Birth: 04-16-1940  Today's Date: 11/29/2016 PT Individual Time: 1131-1157 PT Individual Time Calculation (min): 26 min   Short Term Goals: Week 1:  PT Short Term Goal 1 (Week 1): Pt will perform bed mobility with Min A  PT Short Term Goal 2 (Week 1): Pt will perform chair<>bed transfer with Mod A  PT Short Term Goal 3 (Week 1): Pt will propell WC x100' with S, performing parts management PT Short Term Goal 4 (Week 1): Pt will stand x2 min with lift equipment PT Short Term Goal 5 (Week 1): Pt will perform gait x25' with lift equipment  Skilled Therapeutic Interventions/Progress Updates:  Pt received in w/c reporting significant fatigue following earlier therapy sessions, denying c/o pain, & agreeable to tx. Pt propels w/c room>dayroom with BUE & supervision. Pt utilized cybex kinetron from w/c level up to 60 cm/sec in 60 second bouts x 3 trials with rest breaks in between; activity focused on BLE strengthening. Pt reporting feeling dizzy then lightheaded & BP assessed, see below. Pt returned to room & assisted back to bed via squat pivot with instructional cuing for sequencing and technique. Pt does require min assist for guiding hips. Pt requires mod assist for sit>supine 2/2 inability to transfer LE onto bed. Pt scooted to [facility] with bed in trendelenburg position. At end of session pt left in bed with RN present and all needs within reach.  Therapy Documentation Precautions:  Precautions Precautions: Fall Restrictions Weight Bearing Restrictions: No   BP = 99/58 mmHg (LUE, sitting) pt with c/o lightheadedness. After pursed lip breathing and 1 bout on kinetron BP = 107/58 mmHg, HR = 93 bpm.   See Function Navigator for Current Functional Status.   Therapy/Group: Individual Therapy  [name] 11/29/2016, 12:19 PM

## 2016-11-29 NOTE — Progress Notes (Signed)
Social Work Patient ID: Eduardo Burch, male   DOB: 03-04-1941, 76 y.o.   MRN: [id number]   Met with pt and wife following team conference.  Both aware and agreeable with targeted d/c date of 10/13 and min assist goals overall.  Pt feels optimistic that he will surpass these goals.  No concerns at this time.  Seeley Hissong, LCSW

## 2016-11-29 NOTE — Progress Notes (Signed)
[place] PHYSICAL MEDICINE & REHABILITATION     PROGRESS NOTE    Subjective/Complaints:  Slept much better. Had low bp again in therapy yesterday. Walked more yesterday  ROS: pt denies nausea, vomiting, diarrhea, cough, shortness of breath or chest pain       Objective: Vital Signs: Blood pressure 100/66, pulse 94, temperature 97.6 F (36.4 C), temperature source Oral, resp. rate 18, height 6' (1.829 m), weight 74.9 kg (165 lb 2 oz), SpO2 99 %. No results found. No results for input(s): WBC, HGB, HCT, PLT in the last 72 hours. No results for input(s): NA, K, CL, GLUCOSE, BUN, CREATININE, CALCIUM in the last 72 hours.  Invalid input(s): CO CBG (last 3)  No results for input(s): GLUCAP in the last 72 hours.  Wt Readings from Last 3 Encounters:  11/23/16 74.9 kg (165 lb 2 oz)  11/22/16 67.6 kg (149 lb 0.5 oz)  10/12/16 74.8 kg (164 lb 12.8 oz)    Physical Exam:  HENT:  Head: Normocephalicand atraumatic.  Eyes: EOMare normal. Left eye exhibits no discharge.  Neck: Normal range of motion. Neck supple. No thyromegalypresent.  Cardiovascular:RRR without murmur. No JVD    Respiratory: CTA Bilaterally without wheezes or rales. Normal effort   GI: Soft NT  Musculoskeletal: He exhibits no edemaor deformity.  Psychiatric: pleasant and cooperative Skin. Warm and dry Neurological:  Pt alert and oriented. Cognitively appropriate  RUE 4/5prox to distal, LUE 4- to 4/5. RLE: 2+HF, 2+ to 3- KE and 3+/5 ADF/PF---stable. LLE 2/5 HF, 2+ KE and 3/5 ADF/PF--stable today. DTR's tr to 1+, normal sensory exam throughout  Assessment/Plan: 1. 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 Physiatrist is providing close team supervision and 24 hour management of active medical problems listed below. Physiatrist and rehab team continue to assess barriers to discharge/monitor patient progress toward  functional and medical goals.  Function:  Bathing Bathing position   Position: Shower  Bathing parts Body parts bathed by patient: Right arm, Left arm, Chest, Abdomen, Front perineal area, Right upper leg, Left upper leg Body parts bathed by helper: Right lower leg, Left lower leg, Buttocks, Back  Bathing assist        Upper Body Dressing/Undressing Upper body dressing   What is the patient wearing?: Pull over shirt/dress     Pull over shirt/dress - Perfomed by patient: Thread/unthread right sleeve, Thread/unthread left sleeve, Put head through opening, Pull shirt over trunk          Upper body assist Assist Level: Supervision or verbal cues (per OT note)      Lower Body Dressing/Undressing Lower body dressing   What is the patient wearing?: Pants, Ted Hose, Shoes     Pants- Performed by patient: Thread/unthread left pants leg Pants- Performed by helper: Thread/unthread right pants leg, Pull pants up/down           Shoes - Performed by helper: Don/doff right shoe, Don/doff left shoe       TED Hose - Performed by helper: Don/doff right TED hose, Don/doff left TED hose  Lower body assist Assist for lower body dressing: 2 Helpers      Toileting Toileting     Toileting steps completed by helper: Adjust clothing prior to toileting, Performs perineal hygiene, Adjust clothing after toileting    Toileting assist Assist level: Two helpers   Transfers Chair/bed transfer   Chair/bed transfer method: Squat pivot Chair/bed transfer assist  level: Maximal assist (Pt 25 - 49%/lift and lower) Chair/bed transfer assistive device: Sliding board Mechanical lift: Stedy   Locomotion Ambulation     Max distance: 30 ft Assist level: Touching or steadying assistance (Pt > 75%) (+ 2 for wheelchair follow )   Wheelchair   Type: Manual Max wheelchair distance: 150 ft Assist Level: Supervision or verbal cues  Cognition Comprehension Comprehension assist level: Follows complex  conversation/direction with no assist  Expression Expression assist level: Expresses complex ideas: With no assist  Social Interaction Social Interaction assist level: Interacts appropriately with others - No medications needed.  Problem Solving Problem solving assist level: Solves complex 90% of the time/cues < 10% of the time  Memory Memory assist level: Complete Independence: No helper   Medical Problem List and Plan: 1. 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 -showing improvement in locomotion/activity tolerance 2. DVT Prophylaxis/Anticoagulation: Subcutaneous Lovenox. Reiterated the need for lovenox until mobility improves.  3. Pain Management: Ultram 50 mg every 6 hours as needed, Zanaflex 2 mg every 8 hours as needed 4. Mood: Provide emotional support  -scheduled trazodone at night effective 5. Neuropsych: This patient iscapable of making decisions on hisown behalf. 6. Skin/Wound Care: Routine skin checks 7. Fluids/Electrolytes/Nutrition:   -I personally reviewed the patient's labs today.    -replacing potassium --58meq daily 8.Hypertension: Lisinopril 10 mg daily, Toprol 25 mg daily.   -further hypotension reported yesterday, none recorded however  -my plan was to resume lisinopril---will put it back on hold  -KHT, may need abdominal binder too  -encourage fluids 9.History of prostate cancer status post prostatectomy. CT chest abdomen and pelvis showed no findings typical for metastatic prostate cancer 10.Constipation. Laxative assistance with results  LOS (Days) 6 A FACE TO FACE EVALUATION WAS PERFORMED  Alger Simons T, MD 11/29/2016 10:07 AM

## 2016-11-30 ENCOUNTER — Inpatient Hospital Stay (HOSPITAL_COMMUNITY): Payer: Medicare Other

## 2016-11-30 ENCOUNTER — Inpatient Hospital Stay (HOSPITAL_COMMUNITY): Payer: Medicare Other | Admitting: Physical Therapy

## 2016-11-30 NOTE — Progress Notes (Signed)
Physical Therapy Weekly Progress Note  Patient Details  Name: Eduardo Burch MRN: [id number] Date of Birth: 06-30-1940  Beginning of progress report period: November 23, 2016 End of progress report period: November 30, 2016   Patient has met 5 of 5 short term goals.  Pt currently requires mod/maxA for boosting to stand from normal height sitting surface, min guard for gait up to 25' with rollator and +2 w/c follow for safety d/t poor endurance and LE's buckling when fatigued. Patient actively participates in all therapy sessions and is highly motivated to meet and surpass goals set at [facility] level. Pt's wife has been present briefly for a few sessions however has not begun hands on training, plan for family education session prior to d/c as pt progresses.   Patient continues to demonstrate the following deficits muscle weakness, decreased cardiorespiratoy endurance, unbalanced muscle activation and decreased coordination, decreased problem solving, decreased memory and delayed processing and decreased sitting balance, decreased standing balance, decreased postural control and decreased balance strategies and therefore will continue to benefit from skilled PT intervention to increase functional independence with mobility.  Patient progressing toward long term goals..  Continue plan of care.  PT Short Term Goals Week 1:  PT Short Term Goal 1 (Week 1): Pt will perform bed mobility with Min A  PT Short Term Goal 1 - Progress (Week 1): Met PT Short Term Goal 2 (Week 1): Pt will perform chair<>bed transfer with Mod A  PT Short Term Goal 2 - Progress (Week 1): Met PT Short Term Goal 3 (Week 1): Pt will propell WC x100' with S, performing parts management PT Short Term Goal 3 - Progress (Week 1): Met PT Short Term Goal 4 (Week 1): Pt will stand x2 min with lift equipment PT Short Term Goal 4 - Progress (Week 1): Met PT Short Term Goal 5 (Week 1): Pt will perform gait x25' with lift equipment PT Short  Term Goal 5 - Progress (Week 1): Met  Week 2:  PT Short Term Goal 1 (Week 2): Pt will ambulate 50' modA PT Short Term Goal 2 (Week 2): Pt will perform bed mobility with S PT Short Term Goal 3 (Week 2): Pt will perform stand pivot transfers with modA PT Short Term Goal 4 (Week 2): Pt will initiate stair training    See Function Navigator for Current Functional Status.     [name] 11/30/2016, 7:16 AM

## 2016-11-30 NOTE — Progress Notes (Signed)
[place] PHYSICAL MEDICINE & REHABILITATION     PROGRESS NOTE    Subjective/Complaints:  Had a good night. Therapies are pushing him.   ROS: pt denies nausea, vomiting, diarrhea, cough, shortness of breath or chest pain   Objective: Vital Signs: Blood pressure 140/72, pulse 97, temperature 97.9 F (36.6 C), temperature source Oral, resp. rate 17, height 6' (1.829 m), weight 74.9 kg (165 lb 2 oz), SpO2 99 %. No results found. No results for input(s): WBC, HGB, HCT, PLT in the last 72 hours. No results for input(s): NA, K, CL, GLUCOSE, BUN, CREATININE, CALCIUM in the last 72 hours.  Invalid input(s): CO CBG (last 3)  No results for input(s): GLUCAP in the last 72 hours.  Wt Readings from Last 3 Encounters:  11/23/16 74.9 kg (165 lb 2 oz)  11/22/16 67.6 kg (149 lb 0.5 oz)  10/12/16 74.8 kg (164 lb 12.8 oz)    Physical Exam:  HENT:  Head: Normocephalicand atraumatic.  Eyes: EOMare normal. Left eye exhibits no discharge.  Neck: Normal range of motion. Neck supple. No thyromegalypresent.  Cardiovascular: RRR without murmur. No JVD     Respiratory: CTA Bilaterally without wheezes or rales. Normal effort    GI: Soft  NT/ND  Musculoskeletal: He exhibits no edemaor deformity.  Psychiatric: pleasant and cooperative Skin. Warm and dry Neurological:  Pt alert and oriented. Cognitively appropriate  RUE 4/5prox to distal, LUE 4- to 4/5. RLE: 2+HF, 2+ to 3- KE and 3+/5 ADF/PF---stable. LLE 2 to 2+/5 HF, 2+ KE and 3/5 ADF/PF-- . DTR's tr to 1+, normal sensory exam throughout  Assessment/Plan: 1. 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 Physiatrist is providing close team supervision and 24 hour management of active medical problems listed below. Physiatrist and rehab team continue to assess barriers to discharge/monitor patient progress toward functional and medical  goals.  Function:  Bathing Bathing position   Position: Shower  Bathing parts Body parts bathed by patient: Right arm, Left arm, Chest, Abdomen, Front perineal area, Right upper leg, Left upper leg Body parts bathed by helper: Right lower leg, Left lower leg, Buttocks, Back  Bathing assist        Upper Body Dressing/Undressing Upper body dressing   What is the patient wearing?: Pull over shirt/dress     Pull over shirt/dress - Perfomed by patient: Thread/unthread right sleeve, Thread/unthread left sleeve, Put head through opening, Pull shirt over trunk          Upper body assist Assist Level: Supervision or verbal cues (per OT note)      Lower Body Dressing/Undressing Lower body dressing   What is the patient wearing?: Pants, Ted Hose, Shoes     Pants- Performed by patient: Thread/unthread left pants leg Pants- Performed by helper: Thread/unthread right pants leg, Pull pants up/down           Shoes - Performed by helper: Don/doff right shoe, Don/doff left shoe       TED Hose - Performed by helper: Don/doff right TED hose, Don/doff left TED hose  Lower body assist Assist for lower body dressing: 2 Helpers      Toileting Toileting     Toileting steps completed by helper: Adjust clothing prior to toileting, Performs perineal hygiene, Adjust clothing after toileting    Toileting assist Assist level: Two helpers   Transfers Chair/bed transfer   Chair/bed transfer method: Squat pivot Chair/bed transfer assist level: Touching  or steadying assistance (Pt > 75%) Chair/bed transfer assistive device: Armrests, Bedrails Mechanical lift: Stedy   Locomotion Ambulation     Max distance: 30 ft Assist level: Touching or steadying assistance (Pt > 75%) (+ 2 for wheelchair follow )   Wheelchair   Type: Manual Max wheelchair distance: 100 ft Assist Level: Supervision or verbal cues  Cognition Comprehension Comprehension assist level: Follows complex  conversation/direction with no assist  Expression Expression assist level: Expresses complex ideas: With no assist  Social Interaction Social Interaction assist level: Interacts appropriately with others - No medications needed.  Problem Solving Problem solving assist level: Solves complex 90% of the time/cues < 10% of the time  Memory Memory assist level: Complete Independence: No helper   Medical Problem List and Plan: 1. 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 -showing some improvement in locomotion/activity tolerance 2. DVT Prophylaxis/Anticoagulation: Subcutaneous Lovenox. Reiterated the need for lovenox until mobility improves.  3. Pain Management: Ultram 50 mg every 6 hours as needed, Zanaflex 2 mg every 8 hours as needed 4. Mood: Provide emotional support  -scheduled trazodone at night effective 5. Neuropsych: This patient iscapable of making decisions on hisown behalf. 6. Skin/Wound Care: Routine skin checks 7. Fluids/Electrolytes/Nutrition:   --replacing potassium --4meq daily 8.Hypertension: Lisinopril 10 mg daily, Toprol 25 mg daily.   -further hypotension reported yesterday, none recorded however  -continue to hold lisinopril  -KHT,  abdominal binder too  -encourage fluids 9.History of prostate cancer status post prostatectomy. CT chest abdomen and pelvis showed no findings typical for metastatic prostate cancer 10.Constipation. Laxative assistance with results    LOS (Days) 7 A Staples T, MD 11/30/2016 10:10 AM

## 2016-11-30 NOTE — Progress Notes (Signed)
Occupational Therapy Session Note  Patient Details  Name: Eduardo Burch MRN: [id number] Date of Birth: 10-25-1940  Today's Date: 11/30/2016 OT Individual Time: 1030-1058 and 803-900 OT Individual Time Calculation (min): 28 min (make up) and 57 min   Short Term Goals: Week 1:  OT Short Term Goal 1 (Week 1): Pt will complete sit to stand during LB dressing with max assist of one person. OT Short Term Goal 2 (Week 1): Pt will perform squat pivot transfers to drop arm commode with mod assist.  OT Short Term Goal 3 (Week 1): Pt will donn pants, socks, and shoes over feet with supervision in sitting and use of AE PRN.   Skilled Therapeutic Interventions/Progress Updates:    Session 1: (57 min) Pt with no c/o pain requesting to work on standing, BUE/LE strength, and endurance. Pt propels w/c throughout unit with MOD I for general conditioning and endurance. Pt stand pivot transfer w/c>EOM with MOD A for lifting during sit>stand with VC for hip extension and chest elevation. While seated EOM pt completes passes of basketball x3 rounds (23, 30, and 30 per round) with 1# wrist weights on BUE for strengthening required for BADLs with seated rest breaks between rounds. Pt completes 3x sit to stand with MOD A for lifting and tactile cues for posture and visual cues foot placement prior to standing. Pt unable to maintain unilateral support on RW this date 2/2 fatigue. Pt lateral scoot transfer EOM>w/c with supervision and VC for w/c parts management/safety awareness. OT demonstrates use of reacher and sock aide to doff/don B socks. Pt return demonstrates with supervision and VC for technique.  Session 2: (28 min) Pt requires VC for sequencing supine>EOB with MIN A for trunk elevation. Pt lateral scoot transfer EOB<>w/c<>EOM with supervision and VC for w/c management/set up prior to transfer. OT demonstrates use of shoe funnel to don shoes and pt return demonstrates with supervision/VC for technique. Pt completes UE  exercise seated EOM with 1# dumbbells 1x12 as follows: shoulder flex/ext, ab/adduct, int/ext rot, elevation, horizontal ab/adduct. Elbow: flex/ext Exited session with pt semi reclined in bed with call light in reach and all needs met.   Therapy Documentation Precautions:  Precautions Precautions: Fall Restrictions Weight Bearing Restrictions: No General:   Vital Signs: Therapy Vitals BP: 140/72 Patient Position (if appropriate): Sitting Pain: Pain Assessment Pain Assessment: No/denies pain Pain Score: 0-No pain  See Function Navigator for Current Functional Status.   Therapy/Group: Individual Therapy  [name] 11/30/2016, 11:13 AM

## 2016-11-30 NOTE — Progress Notes (Signed)
Physical Therapy Session Note  Patient Details  Name: Eduardo Burch MRN: [id number] Date of Birth: 14-Oct-1940  Today's Date: 11/30/2016 PT Individual Time: 1300-1400 AND 8588-5027 PT Individual Time Calculation (min): 60 min AND 69 min   Short Term Goals: Week 1:  PT Short Term Goal 1 (Week 1): Pt will perform bed mobility with Min A  PT Short Term Goal 1 - Progress (Week 1): Met PT Short Term Goal 2 (Week 1): Pt will perform chair<>bed transfer with Mod A  PT Short Term Goal 2 - Progress (Week 1): Met PT Short Term Goal 3 (Week 1): Pt will propell WC x100' with S, performing parts management PT Short Term Goal 3 - Progress (Week 1): Met PT Short Term Goal 4 (Week 1): Pt will stand x2 min with lift equipment PT Short Term Goal 4 - Progress (Week 1): Met PT Short Term Goal 5 (Week 1): Pt will perform gait x25' with lift equipment PT Short Term Goal 5 - Progress (Week 1): Met Week 2:  PT Short Term Goal 1 (Week 2): Pt will ambulate 50' modA PT Short Term Goal 2 (Week 2): Pt will perform bed mobility with S PT Short Term Goal 3 (Week 2): Pt will perform stand pivot transfers with modA PT Short Term Goal 4 (Week 2): Pt will initiate stair training  Skilled Therapeutic Interventions/Progress Updates:   Pt received supine in bed and agreeable to PT. Supine>sit transfer with mod assist and min cues for UE placement.   Stand pivot from elevated bed height with mod assist. Second attempt to perform stand pivot transfer with min assist from PT.   modifed squat pivot transfer with min assist from PT to mat table. .   Supine BLE therex.   SAQ AAROM x 10 BLE  Isometric hip adduction x 10 BLE Hip abduction x x 10 BLE with level 1 tband  Hip flexion with AAROM x 8 BLE Heel slides x10 BLE with pillow case to reduce friction.  Cues from breathing, improved ROM and increased eccentric control.   Bed mobility training instructed by PT.  Sit>supine with min assist.  supine>sit with mod assist. Cues  for technique, and improved positioning.   WC mobility 280f x 2 with supervision assist from PT. Cues for [facility] crowded environment for safety.   Pt returned to room and performed squat pivot transfer with mod assist. Sit>supine completed with min assist, and pt left supine in bed with call bell in reach and all needs met.    Session 2.   Pt received sitting in WC and agreeable to PT  WC mobility instructed by PT x [id number] supervision assist from PT with min cues for control in tight space and doorway management.   PT instructed pt in Squat pivot transfers to the R and to the L with mod assist and moderate cues for set up, as well as increased use of momentum to lift off seat.   Stand pivot transfer instructed by PT x 2 from elevated surface with moderate assist to boost to standing positioning and min cues for improved UE placement.   Bed mobility including rolling R and L  With min assist as well as sit<>supine with mod assist to control trunk into sitting and aide BLE into supine. Min cues for technique to improve LE control and improve mechanical advantage.   Sit<>stand x 5 from 29" progressing to 27" inches with mod assist.   Side lying  Therex.  Clam shells. X 10 BLE.  Manual resistance hip/knee flexion/extension x 12 BLE.  LAQ with manual resistance x 12 BLE.  Pt required moderate cues for proper technique, decreased compensation from Trunk, and proper ROM.   Dymanic reversals in supine for lumbar rotation with resistance at BLE with knee flexed to 90 deg x 8 BLE. Stabilized reversals 2x10 BLE with 3 sec hold in each directions.   Pt returned to room and performed squat pivot transfer to bed with mod assist. Sit>supine completed with mod assist, and pt left supine in bed with call bell in reach and all needs met.          Therapy Documentation Precautions:  Precautions Precautions: Fall Restrictions Weight Bearing Restrictions:  No Pain: 0/10   See Function Navigator for Current Functional Status.   Therapy/Group: Individual Therapy  Lorie Phenix 11/30/2016, 2:08 PM

## 2016-12-01 ENCOUNTER — Inpatient Hospital Stay (HOSPITAL_COMMUNITY): Payer: Medicare Other

## 2016-12-01 DIAGNOSIS — R0989 Other specified symptoms and signs involving the circulatory and respiratory systems: Secondary | ICD-10-CM

## 2016-12-01 NOTE — Progress Notes (Signed)
[facility] PHYSICAL MEDICINE & REHABILITATION     PROGRESS NOTE    Subjective/Complaints:  Overall did quite well yesterday. Wife noticed that when he was eating dinner he began to look a little symptomatic (low bp).   ROS: pt denies nausea, vomiting, diarrhea, cough, shortness of breath or chest pain   Objective: Vital Signs: Blood pressure (!) 169/88, pulse 87, temperature 98.2 F (36.8 C), temperature source Oral, resp. rate 18, height 6' (1.829 m), weight 74.9 kg (165 lb 2 oz), SpO2 100 %. No results found. No results for input(s): WBC, HGB, HCT, PLT in the last 72 hours. No results for input(s): NA, K, CL, GLUCOSE, BUN, CREATININE, CALCIUM in the last 72 hours.  Invalid input(s): CO CBG (last 3)  No results for input(s): GLUCAP in the last 72 hours.  Wt Readings from Last 3 Encounters:  11/23/16 74.9 kg (165 lb 2 oz)  11/22/16 67.6 kg (149 lb 0.5 oz)  10/12/16 74.8 kg (164 lb 12.8 oz)    Physical Exam:  HENT:  Head: Normocephalicand atraumatic.  Eyes: EOMare normal. Left eye exhibits no discharge.  Neck: Normal range of motion. Neck supple. No thyromegalypresent.  Cardiovascular: RRR without murmur. No JVD     Respiratory: CTA Bilaterally without wheezes or rales. Normal effort    GI: Soft  NT/ND  Musculoskeletal: He exhibits no edemaor deformity.  Psychiatric: pleasant and cooperative Skin. Warm and dry Neurological:  Pt alert and oriented. Cognitively appropriate  RUE 4/5prox to distal, LUE 4- to 4/5. RLE: 2+HF, 2+ to 3- KE and 3+/5 ADF/PF---stable. LLE 2 to 2+/5 HF, 2+ KE and 3/5 ADF/PF-- . DTR's tr to 1+, normal sensory exam throughout  Assessment/Plan: 1. 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 Physiatrist is providing close team supervision and 24 hour management of active medical problems listed below. Physiatrist and rehab team continue to assess  barriers to discharge/monitor patient progress toward functional and medical goals.  Function:  Bathing Bathing position   Position: Shower  Bathing parts Body parts bathed by patient: Right arm, Left arm, Chest, Abdomen, Front perineal area, Right upper leg, Left upper leg Body parts bathed by helper: Right lower leg, Left lower leg, Buttocks, Back  Bathing assist        Upper Body Dressing/Undressing Upper body dressing   What is the patient wearing?: Pull over shirt/dress     Pull over shirt/dress - Perfomed by patient: Thread/unthread right sleeve, Thread/unthread left sleeve, Put head through opening, Pull shirt over trunk          Upper body assist Assist Level: Supervision or verbal cues (per OT note)      Lower Body Dressing/Undressing Lower body dressing   What is the patient wearing?: Pants, Ted Hose, Shoes     Pants- Performed by patient: Thread/unthread left pants leg Pants- Performed by helper: Thread/unthread right pants leg, Pull pants up/down           Shoes - Performed by helper: Don/doff right shoe, Don/doff left shoe       TED Hose - Performed by helper: Don/doff right TED hose, Don/doff left TED hose  Lower body assist Assist for lower body dressing: 2 Helpers      Toileting Toileting     Toileting steps completed by helper: Adjust clothing prior to toileting, Performs perineal hygiene, Adjust clothing after toileting    Toileting assist Assist level: Two helpers  Transfers Chair/bed transfer   Chair/bed transfer method: Squat pivot Chair/bed transfer assist level: Touching or steadying assistance (Pt > 75%) Chair/bed transfer assistive device: Armrests, Bedrails Mechanical lift: Stedy   Locomotion Ambulation     Max distance: 30 ft Assist level: Touching or steadying assistance (Pt > 75%) (+ 2 for wheelchair follow )   Wheelchair   Type: Manual Max wheelchair distance: 100 ft Assist Level: Supervision or verbal cues   Cognition Comprehension Comprehension assist level: Follows complex conversation/direction with no assist  Expression Expression assist level: Expresses complex ideas: With no assist  Social Interaction Social Interaction assist level: Interacts appropriately with others - No medications needed.  Problem Solving Problem solving assist level: Solves complex 90% of the time/cues < 10% of the time  Memory Memory assist level: Complete Independence: No helper   Medical Problem List and Plan: 1. 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 -continue therapies 2. DVT Prophylaxis/Anticoagulation: Subcutaneous Lovenox. Reiterated the need for lovenox until mobility improves.  3. Pain Management: Ultram 50 mg every 6 hours as needed, Zanaflex 2 mg every 8 hours as needed 4. Mood: Provide emotional support  -scheduled trazodone at night effective 5. Neuropsych: This patient iscapable of making decisions on hisown behalf. 6. Skin/Wound Care: Routine skin checks 7. Fluids/Electrolytes/Nutrition:   --replacing potassium --83meq daily 8.Hypertension/hypotension: Lisinopril 10 mg daily, Toprol 25 mg daily.   -have spoken to patient and wife at length re: bp. Suspect some autonomic dysfunction as well.   -continue to hold lisinopril  -KHT,  abdominal binder too as needed during day  -encourage fluids  -may have to tolerate a little higher am bp's for now 9.History of prostate cancer status post prostatectomy. CT chest abdomen and pelvis showed no findings typical for metastatic prostate cancer 10.Constipation. Laxative assistance with results    LOS (Days) 8 A Vanleer  Meredith Staggers, MD 12/01/2016 9:08 AM

## 2016-12-01 NOTE — Progress Notes (Signed)
Occupational Therapy Session Note  Patient Details  Name: Eduardo Burch MRN: [id number] Date of Birth: [birth year]  Today's Date: 12/01/2016 OT Individual Time: 1100-1157 OT Individual Time Calculation (min): 57 min    Short Term Goals: Week 1:  OT Short Term Goal 1 (Week 1): Pt will complete sit to stand during LB dressing with max assist of one person. OT Short Term Goal 2 (Week 1): Pt will perform squat pivot transfers to drop arm commode with mod assist.  OT Short Term Goal 3 (Week 1): Pt will donn pants, socks, and shoes over feet with supervision in sitting and use of AE PRN.   Skilled Therapeutic Interventions/Progress Updates:    1:1. Pt reqesting to shower this date. Pt lateral scoot transfer using grab bar for steadying with min A and VC for safety awareness. Pt bathes at seated level with VC for lateral leans to wash buttocks and A to wash B lower legs. Pt dresses at sit to stand level with VC for using dressing stick to thread BLE into pants. Pt with low frustration tolerance and after threading not threading LLE into pants on first try requests for help refusing to try again. Pt dons B socks using sock aide with min VC for threading sock onto sock aide. Pt sit to stand with RW with MOD A for lifting and VC for hip extension as OT advances underwear/pants past hips in one stand for energy conservation. Pt propels w/c to/from all tx destinaitons with MOD I and lateral scoot onto mat with supervision and VC for e/c parts management. Pt reaches in all directions MAX distances outside BOS to obtain horse shoes and toss at target with 1# wrist weights on arm with supervision to improve sitting balance and BUE strength required for ADLs.. Exited session with pt seated in w/c with call light in reach and all needs emt.   Therapy Documentation Precautions:  Precautions Precautions: Fall Restrictions Weight Bearing Restrictions: No General:  See Function Navigator for Current Functional  Status.   Therapy/Group: Individual Therapy  [name] 12/01/2016, 12:07 PM

## 2016-12-02 ENCOUNTER — Inpatient Hospital Stay (HOSPITAL_COMMUNITY): Payer: Medicare Other | Admitting: Physical Therapy

## 2016-12-02 NOTE — Progress Notes (Signed)
[place] PHYSICAL MEDICINE & REHABILITATION     PROGRESS NOTE    Subjective/Complaints:  Feeling well. No new complaints. Would like a grounds pass  ROS: pt denies nausea, vomiting, diarrhea, cough, shortness of breath or chest pain   Objective: Vital Signs: Blood pressure (!) 163/86, pulse 87, temperature 98.1 F (36.7 C), temperature source Oral, resp. rate 18, height 6' (1.829 m), weight 74.9 kg (165 lb 2 oz), SpO2 100 %. No results found. No results for input(s): WBC, HGB, HCT, PLT in the last 72 hours. No results for input(s): NA, K, CL, GLUCOSE, BUN, CREATININE, CALCIUM in the last 72 hours.  Invalid input(s): CO CBG (last 3)  No results for input(s): GLUCAP in the last 72 hours.  Wt Readings from Last 3 Encounters:  11/23/16 74.9 kg (165 lb 2 oz)  11/22/16 67.6 kg (149 lb 0.5 oz)  10/12/16 74.8 kg (164 lb 12.8 oz)    Physical Exam:  HENT:  Head: Normocephalicand atraumatic.  Eyes: EOMare normal. Left eye exhibits no discharge.  Neck: Normal range of motion. Neck supple. No thyromegalypresent.  Cardiovascular: RRR without murmur. No JVD     Respiratory: CTA Bilaterally without wheezes or rales. Normal effort    GI: BS +, non-tender, non-distended  Musculoskeletal: He exhibits no edemaor deformity.  Psychiatric: pleasant and cooperative Skin. Warm and dry Neurological:  Pt alert and oriented. Cognitively appropriate  RUE 4/5prox to distal, LUE 4- to 4/5. RLE: 2+HF, 2+ to 3- KE and 3+/5 ADF/PF---stable. LLE 2 to 2+/5 HF, 2+ KE and 3/5 ADF/PF-- . DTR's tr to 1+, normal sensory exam throughout  Assessment/Plan: 1. 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 Physiatrist is providing close team supervision and 24 hour management of active medical problems listed below. Physiatrist and rehab team continue to assess barriers to discharge/monitor patient progress  toward functional and medical goals.  Function:  Bathing Bathing position   Position: Shower  Bathing parts Body parts bathed by patient: Right arm, Left arm, Chest, Abdomen, Front perineal area, Right upper leg, Left upper leg, Buttocks Body parts bathed by helper: Right lower leg, Left lower leg, Back  Bathing assist        Upper Body Dressing/Undressing Upper body dressing   What is the patient wearing?: Pull over shirt/dress     Pull over shirt/dress - Perfomed by patient: Thread/unthread right sleeve, Thread/unthread left sleeve, Put head through opening, Pull shirt over trunk          Upper body assist Assist Level: Supervision or verbal cues      Lower Body Dressing/Undressing Lower body dressing   What is the patient wearing?: Pants, Non-skid slipper socks, Underwear Underwear - Performed by patient: Thread/unthread right underwear leg, Thread/unthread left underwear leg Underwear - Performed by helper: Pull underwear up/down Pants- Performed by patient: Thread/unthread right pants leg Pants- Performed by helper: Pull pants up/down, Thread/unthread left pants leg   Non-skid slipper socks- Performed by helper: Don/doff right sock, Don/doff left sock       Shoes - Performed by helper: Don/doff right shoe, Don/doff left shoe       TED Hose - Performed by helper: Don/doff right TED hose, Don/doff left TED hose  Lower body assist Assist for lower body dressing: Touching or steadying assistance (Pt > 75%)      Toileting Toileting     Toileting steps completed by helper: Adjust clothing prior to toileting,  Performs perineal hygiene, Adjust clothing after toileting    Toileting assist Assist level: Two helpers   Transfers Chair/bed transfer   Chair/bed transfer method: Squat pivot Chair/bed transfer assist level: Touching or steadying assistance (Pt > 75%) Chair/bed transfer assistive device: Armrests, Bedrails Mechanical lift: Stedy   Locomotion Ambulation      Max distance: 30 ft Assist level: Touching or steadying assistance (Pt > 75%) (+ 2 for wheelchair follow )   Wheelchair   Type: Manual Max wheelchair distance: 100 ft Assist Level: Supervision or verbal cues  Cognition Comprehension Comprehension assist level: Follows complex conversation/direction with no assist  Expression Expression assist level: Expresses complex ideas: With no assist  Social Interaction Social Interaction assist level: Interacts appropriately with others - No medications needed.  Problem Solving Problem solving assist level: Solves complex 90% of the time/cues < 10% of the time  Memory Memory assist level: Complete Independence: No helper   Medical Problem List and Plan: 1. 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 -continue therapies 2. DVT Prophylaxis/Anticoagulation: Subcutaneous Lovenox. Reiterated the need for lovenox until mobility improves.  3. Pain Management: Ultram 50 mg every 6 hours as needed, Zanaflex 2 mg every 8 hours as needed 4. Mood: Provide emotional support  -scheduled trazodone at night effective 5. Neuropsych: This patient iscapable of making decisions on hisown behalf. 6. Skin/Wound Care: Routine skin checks 7. Fluids/Electrolytes/Nutrition:   --replacing potassium --21meq daily 8.Hypertension/hypotension: Lisinopril 10 mg daily, Toprol 25 mg daily.   -have spoken to patient and wife at length re: bp. Suspect some autonomic dysfunction as well.   -may need to resume low dose lisinopril  -KHT,  abdominal binder too as needed during day  -encourage fluids  - tolerate a little higher am bp's for now 9.History of prostate cancer status post prostatectomy. CT chest abdomen and pelvis showed no findings typical for metastatic prostate cancer 10.Constipation. Laxative assistance with results    LOS (Days) 9 A [facility] T, MD 12/02/2016 8:32 AM

## 2016-12-02 NOTE — Progress Notes (Signed)
Physical Therapy Note  Patient Details  Name: Eduardo Burch MRN: [id number] Date of Birth: 01-24-1941 Today's Date: 12/02/2016    Time: [phone] 55 minutes  1:1 No c/o pain.  Pt performs supine <> sit with mod A, assist for bilat LEs and trunk.  Squat pivot transfers performed throughout session with supervision.  Supine therex with AAROM for Lt LE, AROM for Rt LE: heel slides, hip abd/add, bridging, SAQ, SLR.  LTR with and without resistance with pt only able to perform 3 x with resistance before needing a rest. Partial sit to stands from elevated surface with pt unable to fully extend hips and knees to come to full standing despite max A. Pt with quick mm fatigue requiring frequent rests but is very motivated to improve.   Taffy Delconte 12/02/2016, 10:26 AM

## 2016-12-03 ENCOUNTER — Inpatient Hospital Stay (HOSPITAL_COMMUNITY): Payer: Medicare Other

## 2016-12-03 ENCOUNTER — Ambulatory Visit: Payer: Medicare Other | Admitting: Physical Therapy

## 2016-12-03 ENCOUNTER — Inpatient Hospital Stay (HOSPITAL_COMMUNITY): Payer: Medicare Other | Admitting: Physical Therapy

## 2016-12-03 MED ORDER — POTASSIUM CHLORIDE CRYS ER 10 MEQ PO TBCR
20.0000 meq | EXTENDED_RELEASE_TABLET | Freq: Every day | ORAL | Status: DC
  Administered 2016-12-04 – 2016-12-15 (×12): 20 meq via ORAL
  Filled 2016-12-03 (×12): qty 2

## 2016-12-03 MED ORDER — POTASSIUM CHLORIDE 20 MEQ/15ML (10%) PO SOLN: 20.0000 meq | Freq: Every day | ORAL | Status: DC

## 2016-12-03 NOTE — Progress Notes (Signed)
Occupational Therapy Session Note  Patient Details  Name: Eduardo Burch MRN: [id number] Date of Birth: [birth year]  Today's Date: 12/03/2016 OT Individual Time: 1100-1155 OT Individual Time Calculation (min): 55 min    Short Term Goals: Week 1:  OT Short Term Goal 1 (Week 1): Pt will complete sit to stand during LB dressing with max assist of one person. OT Short Term Goal 2 (Week 1): Pt will perform squat pivot transfers to drop arm commode with mod assist.  OT Short Term Goal 3 (Week 1): Pt will donn pants, socks, and shoes over feet with supervision in sitting and use of AE PRN.   Skilled Therapeutic Interventions/Progress Updates:    Pt resting in bed upon arrival with wife present.  Discussed bathroom layout and planned modifications.  Pt will use zero entry shower with bench across back.  Recommended height same as w/c which allows pt to perform scoot transfer on/off bench.  Discussed placement of grab bars.  Pt propelled to gym and performed scoot transfer to/from mat at supervision level.  Pt completed sit<>stand X 3 with mod A for first two and max A for last sit<>stand.  Pt unable to complete 4th sit<>stand.  Pt also completed 4 semi squats with mod A. Pt transitioned to Sci[employer] (level 5 for 6 mins Random). Pt returned to room and remained in w/c with all needs within reach.   Therapy Documentation Precautions:  Precautions Precautions: Fall Restrictions Weight Bearing Restrictions: No Pain:  Pt denies pain  See Function Navigator for Current Functional Status.   Therapy/Group: Individual Therapy  Leroy Libman 12/03/2016, 12:04 PM

## 2016-12-03 NOTE — Progress Notes (Signed)
Physical Therapy Session Note  Patient Details  Name: Eduardo Burch MRN: [id number] Date of Birth: 30-Jan-1941  Today's Date: 12/03/2016 PT Individual Time: 0800-0900 PT Individual Time Calculation (min): 60 min   Short Term Goals: Week 2:  PT Short Term Goal 1 (Week 2): Pt will ambulate 50' modA PT Short Term Goal 2 (Week 2): Pt will perform bed mobility with S PT Short Term Goal 3 (Week 2): Pt will perform stand pivot transfers with modA PT Short Term Goal 4 (Week 2): Pt will initiate stair training  Skilled Therapeutic Interventions/Progress Updates: Tx 1: Pt received seated in w/c with handoff from RN; pt and wife were discussing with RN if pt's wife would be able to assist with transfers to reduce time waiting for staff assist. Wife and pt performed squat pivot transfer w/c <>bed with wife providing min guard; educated on importance of w/c setup and locking brakes for safety. Wife checked off to assist pt; safety plan updated. Pt propelled w/c to/from gym >150' with BUE and modI for strengthening and endurance. BP prior to gait trials 139/67 therefore abdominal binder not donned.MaxA sit >stand from w/c; gait x35' with min guard. After gait trial, BP remained WNL 131/74. Sit >stand from standard chair 1" shorter than w/c and unable to reach stand with maxA on first trial, then able to reach stand with maxA on second trial but then too fatigued to attempt ambulation; ultimately performed +2 assist for energy conservation to allow for gait trial. Performed second gait trial x55' min guard. Sit >supine modA for BLE management. Supine BLE bridging 3x10 reps. Supine>sit minA for trunk management. Obtained w/c with speciality back d/t complaints of pain and discomfort when sitting for long periods of time. Transferred back to new w/c with S squat pivot. Returned to room w/c propulsion as above; remained seated in w/c with all needs in reach.   Tx 2: Pt received seated in w/c, denies pain and agreeable to  treatment. W/c propulsion within unit 3 trials >150' throughout session. Pt reports increased comfort with new back rest however reports it feels like it has moved since this AM; therapist repositioned and tightened backrest and increased lumbar support. Transfer w/c <>nustep with S, with pt setting up chair including parts management without assist and increased time. Performed nustep x10 min total with intervals (30 sec increased speed >70 steps/min, 1.5 min slow <50 steps/min); several rest breaks d/t fatigue. Assessed BP as pt reports dizziness; BP 131/67. Returned to room with w/c propulsion as above; transfer w/c>bed with slideboard per pt request d/t fatigue. Sit >supine with modA for BLE management. Remained semi-reclined in bed with all needs in reach at end of session.      Therapy Documentation Precautions:  Precautions Precautions: Fall Restrictions Weight Bearing Restrictions: No   See Function Navigator for Current Functional Status.   Therapy/Group: Individual Therapy  Luberta Mutter 12/03/2016, 9:01 AM

## 2016-12-03 NOTE — Plan of Care (Signed)
Problem: RH PAIN MANAGEMENT Goal: RH STG PAIN MANAGED AT OR BELOW PT'S PAIN GOAL Less than 3  Outcome: Not Progressing Patient reports ultram 50 mg po works "some". Pain level less than 6

## 2016-12-03 NOTE — Progress Notes (Signed)
[facility] PHYSICAL MEDICINE & REHABILITATION     PROGRESS NOTE    Subjective/Complaints:  No new issues. Had good pass yesterday. bp's holding during activity  ROS: pt denies nausea, vomiting, diarrhea, cough, shortness of breath or chest pain    Objective: Vital Signs: Blood pressure (!) 148/88, pulse 82, temperature 97.9 F (36.6 C), temperature source Oral, resp. rate 18, height 6' (1.829 m), weight 74.9 kg (165 lb 2 oz), SpO2 100 %. No results found. No results for input(s): WBC, HGB, HCT, PLT in the last 72 hours. No results for input(s): NA, K, CL, GLUCOSE, BUN, CREATININE, CALCIUM in the last 72 hours.  Invalid input(s): CO CBG (last 3)  No results for input(s): GLUCAP in the last 72 hours.  Wt Readings from Last 3 Encounters:  11/23/16 74.9 kg (165 lb 2 oz)  11/22/16 67.6 kg (149 lb 0.5 oz)  10/12/16 74.8 kg (164 lb 12.8 oz)    Physical Exam:  HENT:  Head: Normocephalicand atraumatic.  Eyes: EOMare normal. Left eye exhibits no discharge.  Neck: Normal range of motion. Neck supple. No thyromegalypresent.  Cardiovascular: RRR without murmur. No JVD     Respiratory:  CTA Bilaterally without wheezes or rales. Normal effort     GI: BS +, non-tender, non-distended  Musculoskeletal: He exhibits no edemaor deformity.  Psychiatric: coopeartive Skin. Warm and dry Neurological:  Pt alert and oriented. Cognitively appropriate  RUE 4/5prox to distal, LUE 4- to 4/5. RLE: 2+HF, 2+ to 3- KE and 3+/5 ADF/PF---stable. LLE 2 to 2+/5 HF, 2+ KE and 3/5 ADF/PF-- . DTR's tr to 1+, normal sensory exam throughout  Assessment/Plan: 1. 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 Physiatrist is providing close team supervision and 24 hour management of active medical problems listed below. Physiatrist and rehab team continue to assess barriers to discharge/monitor patient progress  toward functional and medical goals.  Function:  Bathing Bathing position   Position: Shower  Bathing parts Body parts bathed by patient: Right arm, Left arm, Chest, Abdomen, Front perineal area, Right upper leg, Left upper leg, Buttocks Body parts bathed by helper: Right lower leg, Left lower leg, Back  Bathing assist        Upper Body Dressing/Undressing Upper body dressing   What is the patient wearing?: Pull over shirt/dress     Pull over shirt/dress - Perfomed by patient: Thread/unthread right sleeve, Thread/unthread left sleeve, Put head through opening, Pull shirt over trunk          Upper body assist Assist Level: Supervision or verbal cues      Lower Body Dressing/Undressing Lower body dressing   What is the patient wearing?: Pants, Non-skid slipper socks, Underwear Underwear - Performed by patient: Thread/unthread right underwear leg, Thread/unthread left underwear leg Underwear - Performed by helper: Pull underwear up/down Pants- Performed by patient: Thread/unthread right pants leg Pants- Performed by helper: Pull pants up/down, Thread/unthread left pants leg   Non-skid slipper socks- Performed by helper: Don/doff right sock, Don/doff left sock       Shoes - Performed by helper: Don/doff right shoe, Don/doff left shoe       TED Hose - Performed by helper: Don/doff right TED hose, Don/doff left TED hose  Lower body assist Assist for lower body dressing: Touching or steadying assistance (Pt > 75%)      Toileting Toileting     Toileting steps completed by helper: Adjust clothing prior  to toileting, Performs perineal hygiene, Adjust clothing after toileting    Toileting assist Assist level: Two helpers   Transfers Chair/bed transfer   Chair/bed transfer method: Squat pivot Chair/bed transfer assist level: Touching or steadying assistance (Pt > 75%) Chair/bed transfer assistive device: Armrests, Bedrails Mechanical lift: Stedy   Locomotion Ambulation      Max distance: 30 ft Assist level: Touching or steadying assistance (Pt > 75%) (+ 2 for wheelchair follow )   Wheelchair   Type: Manual Max wheelchair distance: 100 ft Assist Level: Supervision or verbal cues  Cognition Comprehension Comprehension assist level: Follows complex conversation/direction with no assist  Expression Expression assist level: Expresses complex ideas: With no assist  Social Interaction Social Interaction assist level: Interacts appropriately with others - No medications needed.  Problem Solving Problem solving assist level: Solves complex 90% of the time/cues < 10% of the time  Memory Memory assist level: Complete Independence: No helper   Medical Problem List and Plan: 1. 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 -continue therapies 2. DVT Prophylaxis/Anticoagulation: Subcutaneous Lovenox. Reiterated the need for lovenox until mobility improves.  3. Pain Management: Ultram 50 mg every 6 hours as needed, Zanaflex 2 mg every 8 hours as needed 4. Mood: Provide emotional support  -scheduled trazodone at night effective 5. Neuropsych: This patient iscapable of making decisions on hisown behalf. 6. Skin/Wound Care: Routine skin checks 7. Fluids/Electrolytes/Nutrition:   --replacing potassium --3meq daily  -recheck this week 8.Hypertension/hypotension: Lisinopril 10 mg daily, Toprol 25 mg daily.   -have spoken to patient and wife at length re: bp. Suspect some autonomic dysfunction as well.   -may need to resume low dose lisinopril if morning bp's run too high  -KHT,  abdominal binder too as needed during day  -encourage fluids 9.History of prostate cancer status post prostatectomy. CT chest abdomen and pelvis showed no findings typical for metastatic prostate cancer 10.Constipation. Laxative assistance with results    LOS (Days) 10 A [place] T, MD 12/03/2016 8:52 AM

## 2016-12-03 NOTE — Progress Notes (Signed)
Physical Therapy Session Note  Patient Details  Name: Eduardo Burch MRN: [id number] Date of Birth: [birth year]  Today's Date: 12/03/2016 PT Individual Time: 0930-1003 PT Individual Time Calculation (min): 33 min   Short Term Goals: Week 2:  PT Short Term Goal 1 (Week 2): Pt will ambulate 50' modA PT Short Term Goal 2 (Week 2): Pt will perform bed mobility with S PT Short Term Goal 3 (Week 2): Pt will perform stand pivot transfers with modA PT Short Term Goal 4 (Week 2): Pt will initiate stair training  Skilled Therapeutic Interventions/Progress Updates:  Pt received in w/c, agreeable to tx and noting 3-4/10 pain in lower back but premedicated. Pt requesting to work UE today. Pt propelled w/c room<>gym with BUE & supervision. Pt engaged in weighted bar ball taps with 3# weight for 1 minute 10 seconds + 1 minute 30 seconds with rest breaks in between 2/2 BUE fatigue. Pt utilized 2.2 lb weighted ball to perform seated PNF patterns for core & BUE strengthening. During activity pt reported feeling somewhat lightheaded & BP = 88/47 mmHg (LUE). Pt propelled w/c back to room & BP = 104/56 mmHg (LUE). RN made aware of pt's BP during session & pt requesting to leave abdominal binder off. Pt returned to bed via squat pivot with min assist with facilitation for weight shifting and cuing for sequencing. Pt requires mod assist for sit>supine 2/2 inability to transfer BLE onto bed. At end of session pt left in bed with alarm set & all needs within reach.   Therapy Documentation Precautions:  Precautions Precautions: Fall Restrictions Weight Bearing Restrictions: No   See Function Navigator for Current Functional Status.   Therapy/Group: Individual Therapy  [name] 12/03/2016, 12:51 PM

## 2016-12-03 NOTE — Plan of Care (Signed)
Problem: Consults Goal: [facility] GENERAL PATIENT EDUCATION See Patient Education module for education specifics. Patient and wife will verbalize understanding of management of care for patient at home prior to discharge.

## 2016-12-04 ENCOUNTER — Inpatient Hospital Stay (HOSPITAL_COMMUNITY): Payer: Medicare Other

## 2016-12-04 ENCOUNTER — Inpatient Hospital Stay (HOSPITAL_COMMUNITY): Payer: Medicare Other | Admitting: Physical Therapy

## 2016-12-04 NOTE — Progress Notes (Signed)
[facility] PHYSICAL MEDICINE & REHABILITATION     PROGRESS NOTE    Subjective/Complaints:  Had a great night sleep. Also feels therapy went well, feeling stronger  ROS: pt denies nausea, vomiting, diarrhea, cough, shortness of breath or chest pain     Objective: Vital Signs: Blood pressure (!) 156/86, pulse 94, temperature 97.7 F (36.5 C), temperature source Oral, resp. rate 18, height 6' (1.829 m), weight 74.9 kg (165 lb 2 oz), SpO2 100 %. No results found. No results for input(s): WBC, HGB, HCT, PLT in the last 72 hours. No results for input(s): NA, K, CL, GLUCOSE, BUN, CREATININE, CALCIUM in the last 72 hours.  Invalid input(s): CO CBG (last 3)  No results for input(s): GLUCAP in the last 72 hours.  Wt Readings from Last 3 Encounters:  11/23/16 74.9 kg (165 lb 2 oz)  11/22/16 67.6 kg (149 lb 0.5 oz)  10/12/16 74.8 kg (164 lb 12.8 oz)    Physical Exam:  HENT:  Head: Normocephalicand atraumatic.  Eyes: EOMare normal. Left eye exhibits no discharge.  Neck: Normal range of motion. Neck supple. No thyromegalypresent.  Cardiovascular: RRR without murmur. No JVD      Respiratory:  CTA Bilaterally without wheezes or rales. Normal effort      GI: BS +, non-tender, non-distended  Musculoskeletal: He exhibits no edemaor deformity.  Psychiatric: coopeartive Skin. Warm and dry Neurological:  Pt alert and oriented. Cognitively appropriate  RUE 5/5prox to distal, LUE  4+ to 5/5. RLE: 2+HF,  3- KE and 4-/5 ADF/PF---stable. LLE 2 to 2+/5 HF, 3- KE and 3+/5 ADF/PF-- . DTR's tr to 1+, normal sensory exam throughout  Assessment/Plan: 1. 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 Physiatrist is providing close team supervision and 24 hour management of active medical problems listed below. Physiatrist and rehab team continue to assess barriers to discharge/monitor patient progress  toward functional and medical goals.  Function:  Bathing Bathing position   Position: Shower  Bathing parts Body parts bathed by patient: Right arm, Left arm, Chest, Abdomen, Front perineal area, Right upper leg, Left upper leg, Buttocks Body parts bathed by helper: Right lower leg, Left lower leg, Back  Bathing assist        Upper Body Dressing/Undressing Upper body dressing   What is the patient wearing?: Pull over shirt/dress     Pull over shirt/dress - Perfomed by patient: Thread/unthread right sleeve, Thread/unthread left sleeve, Put head through opening, Pull shirt over trunk          Upper body assist Assist Level: Supervision or verbal cues      Lower Body Dressing/Undressing Lower body dressing   What is the patient wearing?: Pants, Non-skid slipper socks, Underwear Underwear - Performed by patient: Thread/unthread right underwear leg, Thread/unthread left underwear leg Underwear - Performed by helper: Pull underwear up/down Pants- Performed by patient: Thread/unthread right pants leg Pants- Performed by helper: Pull pants up/down, Thread/unthread left pants leg   Non-skid slipper socks- Performed by helper: Don/doff right sock, Don/doff left sock       Shoes - Performed by helper: Don/doff right shoe, Don/doff left shoe       TED Hose - Performed by helper: Don/doff right TED hose, Don/doff left TED hose  Lower body assist Assist for lower body dressing: Touching or steadying assistance (Pt > 75%)      Toileting Toileting     Toileting steps completed by  helper: Adjust clothing prior to toileting, Performs perineal hygiene, Adjust clothing after toileting    Toileting assist Assist level: Two helpers   Transfers Chair/bed transfer   Chair/bed transfer method: Squat pivot Chair/bed transfer assist level: Touching or steadying assistance (Pt > 75%) Chair/bed transfer assistive device: Armrests, Bedrails Mechanical lift: Stedy   Locomotion Ambulation      Max distance: 50 Assist level: Touching or steadying assistance (Pt > 75%)   Wheelchair   Type: Manual Max wheelchair distance: 150 ft Assist Level: Supervision or verbal cues  Cognition Comprehension Comprehension assist level: Follows complex conversation/direction with no assist  Expression Expression assist level: Expresses complex ideas: With no assist  Social Interaction Social Interaction assist level: Interacts appropriately with others - No medications needed.  Problem Solving Problem solving assist level: Solves complex 90% of the time/cues < 10% of the time  Memory Memory assist level: Complete Independence: No helper   Medical Problem List and Plan: 1. 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 -continue therapies--team conference today---still with a lof of proximal leg weakness 2. DVT Prophylaxis/Anticoagulation: Subcutaneous Lovenox. Reiterated the need for lovenox until mobility improves.  3. Pain Management: Ultram 50 mg every 6 hours as needed, Zanaflex 2 mg every 8 hours as needed 4. Mood: Provide emotional support  -scheduled trazodone at night effective 5. Neuropsych: This patient iscapable of making decisions on hisown behalf. 6. Skin/Wound Care: Routine skin checks 7. Fluids/Electrolytes/Nutrition:   --replacing potassium --66meq daily  -recheck Thursday 8.Hypertension/hypotension: Lisinopril 10 mg daily, Toprol 25 mg daily.   -have spoken to patient and wife at length re: bp. Suspect some autonomic dysfunction as well.   -may need to resume low dose lisinopril if morning bp's run too high  -KHT,  abdominal binder too as needed during day  -encouraging fluids  -bp's better with therapy over last few days 9.History of prostate cancer status post prostatectomy. CT chest abdomen and pelvis showed no findings typical for metastatic prostate  cancer 10.Constipation. Laxative assistance with results    LOS (Days) 11 A [place]  Meredith Staggers, MD 12/04/2016 8:57 AM

## 2016-12-04 NOTE — Progress Notes (Signed)
Occupational Therapy Session Note  Patient Details  Name: Eduardo Burch MRN: [id number] Date of Birth: 1940/11/13  Today's Date: 12/04/2016 OT Individual Time: 0800-0900 OT Individual Time Calculation (min): 60 min    Short Term Goals: Week 2:  OT Short Term Goal 1 (Week 2): Pt will perform squat pivot transfers to drop arm commode with mod assist.  OT Short Term Goal 2 (Week 2): Pt will perform swquat pivot tub bench transfers with mod A OT Short Term Goal 3 (Week 2): Pt will pull up pants over hips with mod A for standing  Skilled Therapeutic Interventions/Progress Updates:    Pt resting in w/c upon arrival.  Pt declined bathing/dressing this morning stating he and his wife had already "taken care of that." Pt propelled to gym performed scoot transfer to mat with supervision.  Pt required use of toilet and returned to room.  Pt performed scoot transfer to drop arm BSC and removed clothing at supervision level.  Pt completed hygiene at supervision level. Pt required assistance for pulling pants over hips.  Pt returned to gym and completed sit<>stand X 5 with max A from 24" surface.  Pt transitioned to [employer] for 5 mins at Random at work load 5. Pt returned to room and remained in w/c with all needs within reach.   Therapy Documentation Precautions:  Precautions Precautions: Fall Restrictions Weight Bearing Restrictions: No Pain: Pt c/o stiffnes in back but "manageable" See Function Navigator for Current Functional Status.   Therapy/Group: Individual Therapy  Leroy Libman 12/04/2016, 9:22 AM

## 2016-12-04 NOTE — Progress Notes (Signed)
Occupational Therapy Note  Patient Details  Name: Eduardo Burch MRN: [id number] Date of Birth: 1940-10-10  Today's Date: 12/04/2016 OT Individual Time: 0375-4360 OT Individual Time Calculation (min): 55 min   Pt denies pain Individual Therapy  Pt practiced tub bench transfers to simulate home environment.  Pt performed scoot transfer (w/c<>tub bench) at supervision level.  Pt transitioned to sit<>stand from 24" surface with max A X 3.  Pt also engaged in standing activity X 3 (30 secs, 20 secs, and 5 secs). Pt stated he was "drained" and propelled back to room.  Pt attempted scoot transfer to bed but suggested use of slide board.  Pt completed slide board transfer at supervision level.  Pt remained in bed with all needs within reach.    Leotis Shames [facility] 12/04/2016, 2:03 PM

## 2016-12-04 NOTE — Progress Notes (Signed)
Physical Therapy Session Note  Patient Details  Name: Eduardo Burch MRN: [id number] Date of Birth: [birth year]  Today's Date: 12/04/2016 PT Individual Time: 1100-1200 and 1500-1530 PT Individual Time Calculation (min): 60 min and 30 min (total 90 min)  Short Term Goals: Week 2:  PT Short Term Goal 1 (Week 2): Pt will ambulate 50' modA PT Short Term Goal 2 (Week 2): Pt will perform bed mobility with S PT Short Term Goal 3 (Week 2): Pt will perform stand pivot transfers with modA PT Short Term Goal 4 (Week 2): Pt will initiate stair training  Skilled Therapeutic Interventions/Progress Updates: Tx 1: Pt received seated in bed, denies pain and agreeable to treatment. Supine>sit with S after pt initially requesting assistance, but then able to demonstrate activity without assistance. Transfer bed>w/c S squat pivot. W/c propulsion to gym modI. Gait x3 trials x50-60' each before fatigued and requiring seated rest break; maxA overall for sit >stand. Standing balance on foam airex pad 3x30 sec>1 min for focus on ankle strategy and standing tolerance. Standing alternating toe taps to 3" step with min guard; able to perform x10 reps before fatigued, 3 sets total. Sit >supine minA for RLE management. Supine bridging 3x10 reps. Supine L hip flexor stretch off side of mat with gentle over pressure. MinA supine>sit for trunk boosting up to L elbow. Transferred back to w/c with S squat pivot. W/c propulsion back to room modI; remained seated in w/c at end of session, all needs in reach.   Tx 2: Pt received supine in bed, denies pain and agreeable to treatment. Supine>sit with HOB elevated and S with increased time. Squat pivot w/c <>bed and w/c <>mat table with S. Performed BLE w/c propulsion backwards for hip flexion/quad strengthening;  Performed semi-reclined russian twist with 2kg ball, hooklying crunches with weighted ball, oblique crunches, chest press with 3 kg ball. All exercises 2-3 sets 10-15 reps per  tolerance. Returned to room w/c propulsion modI with BUE. Squat pivot transfer to bed with S. MinA sit >supine for LE management. Remained supine in bed at end of session, all needs in reach.      Therapy Documentation Precautions:  Precautions Precautions: Fall Restrictions Weight Bearing Restrictions: No   See Function Navigator for Current Functional Status.   Therapy/Group: Individual Therapy  Luberta Mutter 12/04/2016, 1:02 PM

## 2016-12-04 NOTE — Significant Event (Addendum)
Pt scheduled for metoprolol 12.5 mg BID; BP this am 156/86 prior to therapy. Pt reports dizziness lightheaded upon rising and movement. Rechecked BP sitting in chair 79/50. Assisted pt back to bed and reported symptoms subsided. Reports feeling weak and tolerating therapy as long as they are spaced out. TEDs applied, abd binder is off at present. ? D/c metoprolol; pt reports this is a daily debate. Also ? D/c miralax/senna as pt refusing these meds as well, not needed. Thanks, Margarito Liner

## 2016-12-04 NOTE — Progress Notes (Signed)
Occupational Therapy Weekly Progress Note  Patient Details  Name: Eduardo Burch MRN: [id number] Date of Birth: 1940-09-08  Beginning of progress report period: November 24, 2016 End of progress report period: December 04, 2016  Patient has met 2 of 3 short term goals.  Pt progress with BADLs and transitional movements has been steady since admission.  Pt performs scoot transfers w/c<>bed with supervision but requires max A for squat pivot transfers on a consistent basis.  Pt requires max A for squat pivot toilet transfers and tub bench transfers.  Pt fatigues quickly after ~3-4 sit<>stand and then requires tot A for sit<>stand.    Patient continues to demonstrate the following deficits: muscle weakness, decreased cardiorespiratoy endurance and decreased standing balance, decreased postural control and decreased balance strategies and therefore will continue to benefit from skilled OT intervention to enhance overall performance with BADL and Reduce care partner burden.  Patient progressing toward long term goals..  Continue plan of care.  OT Short Term Goals Week 1:  OT Short Term Goal 1 (Week 1): Pt will complete sit to stand during LB dressing with max assist of one person. OT Short Term Goal 1 - Progress (Week 1): Met OT Short Term Goal 2 (Week 1): Pt will perform squat pivot transfers to drop arm commode with mod assist.  OT Short Term Goal 2 - Progress (Week 1): Progressing toward goal OT Short Term Goal 3 (Week 1): Pt will donn pants, socks, and shoes over feet with supervision in sitting and use of AE PRN.  OT Short Term Goal 3 - Progress (Week 1): Met Week 2:  OT Short Term Goal 1 (Week 2): Pt will perform squat pivot transfers to drop arm commode with mod assist.  OT Short Term Goal 2 (Week 2): Pt will perform swquat pivot tub bench transfers with mod A OT Short Term Goal 3 (Week 2): Pt will pull up pants over hips with mod A for standing      Therapy Documentation Precautions:   Precautions Precautions: Fall Restrictions Weight Bearing Restrictions: No  See Function Navigator for Current Functional Status.    Leotis Shames [facility] 12/04/2016, 6:43 AM

## 2016-12-05 ENCOUNTER — Inpatient Hospital Stay (HOSPITAL_COMMUNITY): Payer: Medicare Other | Admitting: Physical Therapy

## 2016-12-05 ENCOUNTER — Inpatient Hospital Stay (HOSPITAL_COMMUNITY): Payer: Medicare Other

## 2016-12-05 NOTE — Progress Notes (Signed)
Pt resting in bed quietly. Easily aroused, c/o back pain. Pain management administered and effective. Continent of b/b. Had a restful night without any further concerns noted. Safety maintained. callbell within reach. Will continue to monitor.

## 2016-12-05 NOTE — Progress Notes (Signed)
[facility] PHYSICAL MEDICINE & REHABILITATION     PROGRESS NOTE    Subjective/Complaints:  Slept well but woke up with a headache. No new issues otherwise  ROS: pt denies nausea, vomiting, diarrhea, cough, shortness of breath or chest pain    Objective: Vital Signs: Blood pressure 135/73, pulse (!) 104, temperature 98 F (36.7 C), temperature source Oral, resp. rate 16, height 6' (1.829 m), weight 75.3 kg (165 lb 14.6 oz), SpO2 100 %. No results found. No results for input(s): WBC, HGB, HCT, PLT in the last 72 hours. No results for input(s): NA, K, CL, GLUCOSE, BUN, CREATININE, CALCIUM in the last 72 hours.  Invalid input(s): CO CBG (last 3)  No results for input(s): GLUCAP in the last 72 hours.  Wt Readings from Last 3 Encounters:  12/05/16 75.3 kg (165 lb 14.6 oz)  11/22/16 67.6 kg (149 lb 0.5 oz)  10/12/16 74.8 kg (164 lb 12.8 oz)    Physical Exam:  HENT:  Head: Normocephalicand atraumatic.  Eyes: EOMare normal. Left eye exhibits no discharge.  Neck: Normal range of motion. Neck supple. No thyromegalypresent.  Cardiovascular: RRR without murmur. No JVD       Respiratory:  CTA Bilaterally without wheezes or rales. Normal effort      GI: BS +, non-tender, non-distended  Musculoskeletal: He exhibits no edemaor deformity.  Psychiatric: coopeartive Skin. Warm and dry Neurological:  Pt alert and oriented. Cognitively appropriate  RUE 5/5prox to distal, LUE  4+ to 5/5. RLE: 2+HF,  3- KE and 4-/5 ADF/PF---stable. LLE 2 to 2+/5 HF, 3- KE and 3+/5 ADF/PF-- motor exam stable. DTR's tr to 1+, normal sensory exam throughout  Assessment/Plan: 1. 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 Physiatrist is providing close team supervision and 24 hour management of active medical problems listed below. Physiatrist and rehab team continue to assess barriers to discharge/monitor  patient progress toward functional and medical goals.  Function:  Bathing Bathing position   Position: Shower  Bathing parts Body parts bathed by patient: Right arm, Left arm, Chest, Abdomen, Front perineal area, Right upper leg, Left upper leg, Buttocks Body parts bathed by helper: Right lower leg, Left lower leg, Back  Bathing assist        Upper Body Dressing/Undressing Upper body dressing   What is the patient wearing?: Pull over shirt/dress     Pull over shirt/dress - Perfomed by patient: Thread/unthread right sleeve, Thread/unthread left sleeve, Put head through opening, Pull shirt over trunk          Upper body assist Assist Level: Supervision or verbal cues      Lower Body Dressing/Undressing Lower body dressing   What is the patient wearing?: Pants, Non-skid slipper socks, Underwear Underwear - Performed by patient: Thread/unthread right underwear leg, Thread/unthread left underwear leg Underwear - Performed by helper: Pull underwear up/down Pants- Performed by patient: Thread/unthread right pants leg Pants- Performed by helper: Pull pants up/down, Thread/unthread left pants leg   Non-skid slipper socks- Performed by helper: Don/doff right sock, Don/doff left sock       Shoes - Performed by helper: Don/doff right shoe, Don/doff left shoe       TED Hose - Performed by helper: Don/doff right TED hose, Don/doff left TED hose  Lower body assist Assist for lower body dressing: Touching or steadying assistance (Pt > 75%)      Toileting Toileting   Toileting steps completed by  patient: Adjust clothing prior to toileting, Performs perineal hygiene, Adjust clothing after toileting Toileting steps completed by helper: Adjust clothing prior to toileting, Performs perineal hygiene, Adjust clothing after toileting    Toileting assist Assist level: Two helpers   Transfers Chair/bed transfer   Chair/bed transfer method: Squat pivot Chair/bed transfer assist level:  Touching or steadying assistance (Pt > 75%) Chair/bed transfer assistive device: Armrests, Bedrails Mechanical lift: Stedy   Locomotion Ambulation     Max distance: 50 Assist level: Touching or steadying assistance (Pt > 75%)   Wheelchair   Type: Manual Max wheelchair distance: 150 ft Assist Level: Supervision or verbal cues  Cognition Comprehension Comprehension assist level: Follows complex conversation/direction with no assist  Expression Expression assist level: Expresses complex ideas: With extra time/assistive device  Social Interaction Social Interaction assist level: Interacts appropriately with others - No medications needed.  Problem Solving Problem solving assist level: Solves complex problems: With extra time  Memory Memory assist level: Complete Independence: No helper   Medical Problem List and Plan: 1. 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 -addressing proximal/hip girdle strength and stamina 2. DVT Prophylaxis/Anticoagulation: Subcutaneous Lovenox. Reiterated the need for lovenox until mobility improves.  3. Pain Management: Ultram 50 mg every 6 hours as needed, Zanaflex 2 mg every 8 hours as needed 4. Mood: Provide emotional support  -scheduled trazodone at night effective 5. Neuropsych: This patient iscapable of making decisions on hisown behalf. 6. Skin/Wound Care: Routine skin checks 7. Fluids/Electrolytes/Nutrition:   --replacing potassium --49meq daily  -recheck Thursday 8.Hypertension/hypotension: Lisinopril 10 mg daily, Toprol 25 mg daily.   -have spoken to patient and wife at length re: bp. Suspect some autonomic dysfunction as well.   -may need to resume low dose lisinopril if morning bp's run too high  -KHT,  abdominal binder too as needed during day  -encouraging fluids  -bp's generally better with therapy over last few  days 9.History of prostate cancer status post prostatectomy. CT chest abdomen and pelvis showed no findings typical for metastatic prostate cancer 10.Constipation. Laxative assistance with results    LOS (Days) 12 A [place] T, MD 12/05/2016 8:51 AM

## 2016-12-05 NOTE — Progress Notes (Signed)
Physical Therapy Session Note  Patient Details  Name: Eduardo Burch MRN: [id number] Date of Birth: [birth year]  Today's Date: 12/05/2016 PT Individual Time: 1015-1130 PT Individual Time Calculation (min): 75 min   Short Term Goals: Week 2:  PT Short Term Goal 1 (Week 2): Pt will ambulate 50' modA PT Short Term Goal 2 (Week 2): Pt will perform bed mobility with S PT Short Term Goal 3 (Week 2): Pt will perform stand pivot transfers with modA PT Short Term Goal 4 (Week 2): Pt will initiate stair training  Skilled Therapeutic Interventions/Progress Updates: Pt received supine in bed, denies pain and agreeable to treatment. Supine>sit with S, HOB elevated and bedrails. Transfer bed >w/c and w/c <>mat table squat pivot with min guard. Gait x2 trials x15' and x25' with min guard; requires maxA to boost to standing from w/c with rollator. Note RLE buckling during gait, however pt able to recover without additional assist. Supine hip flexor stretch x2 min each side. Heel slides 2x10 reps BLE for hip flexor/hamstring strengthening and coordination. Unable to perform straight leg raise without significant assistance. Bridging 3x10 reps with min cues for technique. Chest press and pec fly with 4# hand weights BUE 3x10 reps each. Seated hamstring stretch on edge of mat with 3" step and min cues for technique; 2 x 75min each LE. Seated zoomball performed 2x30-45 sec for middle trap, rhomboid, posterior delt strengthening. Squat pivot transfer back to w/c with min guard. W/c propulsion to room modI with BUE. Lateral scoot transfer to bed with setupA d/t fatigue. Sit >supine modA. Remained supine in bed at end of session, all needs in reach.      Therapy Documentation Precautions:  Precautions Precautions: Fall Restrictions Weight Bearing Restrictions: No   See Function Navigator for Current Functional Status.   Therapy/Group: Individual Therapy  Luberta Mutter 12/05/2016, 11:34 AM

## 2016-12-05 NOTE — Progress Notes (Signed)
Occupational Therapy Session Note  Patient Details  Name: Eduardo Burch MRN: [id number] Date of Birth: October 23, 1940  Today's Date: 12/05/2016 OT Individual Time: 0800-0910 OT Individual Time Calculation (min): 70 min    Short Term Goals: Week 2:  OT Short Term Goal 1 (Week 2): Pt will perform squat pivot transfers to drop arm commode with mod assist.  OT Short Term Goal 2 (Week 2): Pt will perform swquat pivot tub bench transfers with mod A OT Short Term Goal 3 (Week 2): Pt will pull up pants over hips with mod A for standing  Skilled Therapeutic Interventions/Progress Updates:    Pt resting in w/c upon arrival.  Pt propelled to gym and performed scoot transfer to therapy mat.  Initial focus on sit<>stand from 23" surface X 5 with mod A initially and max A for last sit<>stand.  Pt also performed quarter squats 3 X 6.  Pt c/o lightheadedness initially and abdominal binder donned with no subsequent reports.  Pt transitioned SciFit (5 mins on work load 5) and NuStep (5 mins on work load 3).  Pt returned to room and performed scoot transfer to bed.  Pt required assistance to bring BLE onto bed.  Pt remained in bed with all needs within reach.   Therapy Documentation Precautions:  Precautions Precautions: Fall Restrictions Weight Bearing Restrictions: No Pain: Pain Assessment Pain Score: 4  Pain Type: Acute pain Pain Location: Head Pain Descriptors / Indicators: Headache Patients Stated Pain Goal: 2 Pain Intervention(s):meds admin prior to therapy  See Function Navigator for Current Functional Status.   Therapy/Group: Individual Therapy  Leroy Libman 12/05/2016, 9:29 AM

## 2016-12-05 NOTE — Progress Notes (Signed)
Social Work Patient ID: Eduardo Burch, male   DOB: [birth year], 76 y.o.   MRN: [id number]   Met with pt and spouse today to review team conference.  Both aware that team continues to plan for 10/13 d/c date and supervision/ min assist goals overall. They very much want to resume OPPT at [facility] - will refer.  Pleased with progress to date.  [name], LCSW

## 2016-12-05 NOTE — Patient Care Conference (Signed)
Inpatient RehabilitationTeam Conference and Plan of Care Update Date: 12/04/2016   Time: 2:40 PM    Patient Name: Eduardo Burch      Medical Record Number: [id number]  Date of Birth: 1941-02-09 Sex: Male         Room/Bed: 4W10C/4W10C-01 Payor Info: Payor: [employer] / Plan: [employer] MEDICARE / Product Type: *No Product type* /    Admitting Diagnosis: GBS VS CIDP  Admit Date/Time:  11/23/2016  1:13 PM Admission Comments: No comment available   Primary Diagnosis:  CIDP (chronic inflammatory demyelinating polyneuropathy) (HCC) Principal Problem: CIDP (chronic inflammatory demyelinating polyneuropathy) ([place])  Patient Active Problem List   Diagnosis Date Noted  . Ankle pain 11/19/2016  . Hyperbilirubinemia 11/17/2016  . Leukocytosis 11/14/2016  . Hypophosphatemia 11/14/2016  . Dysphagia 11/14/2016  . Abnormal LFTs 11/14/2016  . Lower extremity weakness 11/13/2016  . CIDP (chronic inflammatory demyelinating polyneuropathy) ([place]) 11/12/2016  . Back pain without sciatica 10/25/2016  . 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 Hyperlipidemia 10/03/2016  . H/O prostate cancer 10/03/2016  . Hyponatremia 10/03/2016  . Leg weakness 09/28/2016  . Constipation   . Tachycardia 08/16/2016  . Hypertension, essential 08/15/2016    Expected Discharge Date: Expected Discharge Date: 12/15/16  Team Members Present:       Current Status/Progress Goal Weekly Team Focus  Medical   improving strength. bp variable/drops when fatigued, some autonomic dysfunction  increase balance  bp stabilization, pain control   Bowel/Bladder   continent of bowel and bladder. last BM 9/29. Pt is refusing his miralax.  Remain continent of bowel and bladder function.  Monitor for changes in b/b function   Swallow/Nutrition/ Hydration             ADL's   bathing - mod A; LB dsg - mod A, toilet transfers - max A; toileting - max A; BLE fatigue quickly and requires tot A for sit<>stand  bathing-min A; UB  dsge-S; LB dge-min A; toilet/shower transfers-min A; toileitng-min A  sit<>stand, standing balance, functional transfers, BADL retraining, discharge planning, activity tolerance   Mobility   maxA sit<>stand, minA bed mobility, gait min guard 40-50';  limited by fatigue  min assist overall; mod I for wheelchair mobility, supervision bed mobility   sit <> stand, LE strengthening, activity tolerance, gait training   Communication             Safety/Cognition/ Behavioral Observations            Pain   Denies pain during night shift. Has generalized pain during day shift with prn tramadol.  Pain less than or equal to 2.  Assess for pain q shift and prn.   Skin   CDI  No skin breakdown while in rehab.  Monitor skin q shift prn.    Rehab Goals Patient on target to meet rehab goals: Yes *See Care Plan and progress notes for long and short-term goals.     Barriers to Discharge  Current Status/Progress Possible Resolutions Date Resolved   Physician    Medical stability        bp regulation, teds/binder      Nursing                  PT                    OT                  SLP  SW                Discharge Planning/Teaching Needs:  Plan home with his spouse who "is there 99% of the time" per pt.  Teaching to be planned closer to d/c   Team Discussion:  Make steady progress;  BP issues - likely autonomic dysfunction.  Easily fatigued.  Scoot transfers at supervision - min/guard amb 40-50 '  More focused on getting home.  Cont b/b.    Revisions to Treatment Plan:  None    Continued Need for Acute Rehabilitation Level of Care: The patient requires daily medical management by a physician with specialized training in physical medicine and rehabilitation for the following conditions: Daily direction of a multidisciplinary physical rehabilitation program to ensure safe treatment while eliciting the highest outcome that is of practical value to the patient.: Yes Daily  medical management of patient stability for increased activity during participation in an intensive rehabilitation regime.: Yes Daily analysis of laboratory values and/or radiology reports with any subsequent need for medication adjustment of medical intervention for : Neurological problems;Blood pressure problems  Eduardo Burch 12/05/2016, 1:34 PM

## 2016-12-06 ENCOUNTER — Inpatient Hospital Stay (HOSPITAL_COMMUNITY): Payer: Medicare Other | Admitting: Physical Therapy

## 2016-12-06 ENCOUNTER — Ambulatory Visit: Payer: Medicare Other | Admitting: Physical Therapy

## 2016-12-06 ENCOUNTER — Inpatient Hospital Stay (HOSPITAL_COMMUNITY): Payer: Medicare Other

## 2016-12-06 LAB — BASIC METABOLIC PANEL
ANION GAP: 7 (ref 5–15)
BUN: 14 mg/dL (ref 6–20)
CALCIUM: 8.1 mg/dL — AB (ref 8.9–10.3)
CO2: 27 mmol/L (ref 22–32)
Chloride: 102 mmol/L (ref 101–111)
Creatinine, Ser: 0.55 mg/dL — ABNORMAL LOW (ref 0.61–1.24)
GFR calc Af Amer: >60 mL/min (ref >60)
GLUCOSE: 148 mg/dL — AB (ref 65–99)
Potassium: 4.4 mmol/L (ref 3.5–5.1)
SODIUM: 136 mmol/L (ref 135–145)

## 2016-12-06 MED ORDER — POLYETHYLENE GLYCOL 3350 17 G PO PACK: 17.0000 g | PACK | Freq: Every day | ORAL | Status: DC | PRN

## 2016-12-06 NOTE — Progress Notes (Signed)
Physical Therapy Session Note  Patient Details  Name: Eduardo Burch MRN: [id number] Date of Birth: 05-06-1940  Today's Date: 12/06/2016 PT Individual Time: 1000-1055 PT Individual Time Calculation (min): 55 min   Short Term Goals: Week 2:  PT Short Term Goal 1 (Week 2): Pt will ambulate 50' modA PT Short Term Goal 2 (Week 2): Pt will perform bed mobility with S PT Short Term Goal 3 (Week 2): Pt will perform stand pivot transfers with modA PT Short Term Goal 4 (Week 2): Pt will initiate stair training  Skilled Therapeutic Interventions/Progress Updates: Tx 1: Pt received supine in bed, denies pain and agreeable to treatment. Supine>sit with S and bedrails, HOB elevated. Squat pivot transfer bed>w/c and w/c <>mat table with min guard. Sit <>stand and stand pivot with rollator to sit on rollator seat; modA overall with boost to stand and assist to maintain standing while alternating removing one UE from rollator to pivot. Pt reports feeling very unsteady with activity, legs "still not strong enough". Attempted two gait trials, limited to 5-6' each before fatigued and requiring urgent sit in w/c d/t LE fatigue. Pt reports subjective weakness compared to past several days; discussed with pt that he is performing more in first session with OT than he had been, and may just be fatigued from that session. Squat pivot transfer to nustep with minA for clearing hips uphill to bike. Performed 3 sets 2-2.5 min with BUE/BLE for strengthening and endurance. Returned to room modI w/c propulsion. Lateral scoot transfer w/c >bed with transfer board and setupA. modA sit >supine for LE management. Remained semi-reclined in bed at end of session, all needs in reach.   Tx 2: Pt received asleep in bed; easily awoken however pt politely declines therapy. Reports he recently finished OT session and is "exhausted" and has new k-pad on upper back which is helping significantly with pain, would like to rest at this time. Will  continue to follow per POC as able.     Therapy Documentation Precautions:  Precautions Precautions: Fall Restrictions Weight Bearing Restrictions: No General: PT Amount of Missed Time (min): 30 Minutes PT Missed Treatment Reason: Patient fatigue;Pain Pain: Pain Assessment Pain Assessment: 0-10 Pain Score: 4  Pain Type: Acute pain Pain Location: Back Pain Orientation: Mid;Lower Pain Descriptors / Indicators: Aching Pain Frequency: Intermittent Pain Onset: Gradual Patients Stated Pain Goal: 1 Pain Intervention(s): Medication (See eMAR)   See Function Navigator for Current Functional Status.   Therapy/Group: Individual Therapy  Luberta Mutter 12/06/2016, 10:59 AM

## 2016-12-06 NOTE — Progress Notes (Signed)
Occupational Therapy Session Note  Patient Details  Name: Eduardo Burch MRN: [id number] Date of Birth: 1940/04/26  Today's Date: 12/06/2016 OT Individual Time: 7062-3762 OT Individual Time Calculation (min): 55 min    Short Term Goals: Week 2:  OT Short Term Goal 1 (Week 2): Pt will perform squat pivot transfers to drop arm commode with mod assist.  OT Short Term Goal 2 (Week 2): Pt will perform swquat pivot tub bench transfers with mod A OT Short Term Goal 3 (Week 2): Pt will pull up pants over hips with mod A for standing  Skilled Therapeutic Interventions/Progress Updates:    Focus on sit<>stand, functional transfers, and BUE strengthening/endurance.  Pt propelled to gym and transferred to mat.  Pt performed sit<>stand X 6 with mod A (23"-2, 22"-1, 21"-1, and 20"-2). Pt propelled to day room and attempted to use Scifit for 5 mins.  Pt was unable to complete only 2 mins.  Pt returned to room and transferred back to bed to rest before next therapy.  Focus on ongoing sit<>stand and standing balance to increase independence with BADLs and functional transfers to variety of surface heights.   Therapy Documentation Precautions:  Precautions Precautions: Fall Restrictions Weight Bearing Restrictions: No  Pain:  Pt c/o L neck stiffnes/soreness; MD aware and kinesio tape applied   See Function Navigator for Current Functional Status.   Therapy/Group: Individual Therapy  Leroy Libman 12/06/2016, 8:57 AM

## 2016-12-06 NOTE — Progress Notes (Signed)
Physical Therapy Session Note  Patient Details  Name: Eduardo Burch MRN: [id number] Date of Birth: 05-28-1940  Today's Date: 12/05/2016 PT Individual Time:1615-1700   45 min   Short Term Goals: Week 2:  PT Short Term Goal 1 (Week 2): Pt will ambulate 50' modA PT Short Term Goal 2 (Week 2): Pt will perform bed mobility with S PT Short Term Goal 3 (Week 2): Pt will perform stand pivot transfers with modA PT Short Term Goal 4 (Week 2): Pt will initiate stair training  Skilled Therapeutic Interventions/Progress Updates:   Pt received sitting in WC and agreeable to PT  PT instructed pt in WC mobility in hall of hospital 364f, [id number] [id number] supervision assist assist in crowded environment and no cues when pt had space. PT also instructed pt in WC mobility on unlevel surface of hospital entrance [id number] with min cues for anterior weight shift to prevent posterior LOB as well as improved contol on downhill grade.   Sit<>stand x 3 [facility] max assist and BUE support on rollator. Pt reports that he feels too unstable and weak to attempt gait training during this therapy session.    Pt returned to room and performed SB transfer to bed with supervision assist. Sit>supine completed with min assist and left supine in bed with call bell in reach and all needs met.        Therapy Documentation Precautions:  Precautions Precautions: Fall Restrictions Weight Bearing Restrictions: No Vital Signs: Therapy Vitals Temp: 98.9 F (37.2 C) Temp Source: Oral Pulse Rate: 91 Resp: 18 BP: (!) 150/79 (RN notifed) Patient Position (if appropriate): Lying Oxygen Therapy SpO2: 100 % O2 Device: Not Delivered Pain:   pain: 0/10  See Function Navigator for Current Functional Status.   Therapy/Group: Individual Therapy  [name]4/2018, 7:53 AM

## 2016-12-06 NOTE — Progress Notes (Signed)
[place] PHYSICAL MEDICINE & REHABILITATION     PROGRESS NOTE    Subjective/Complaints:  Right lower neck still bothersome. Affected sleep last night. Therapy went well during the day however  ROS: pt denies nausea, vomiting, diarrhea, cough, shortness of breath or chest pain     Objective: Vital Signs: Blood pressure (!) 150/79, pulse 91, temperature 98.9 F (37.2 C), temperature source Oral, resp. rate 18, height 6' (1.829 m), weight 75.3 kg (165 lb 14.6 oz), SpO2 100 %. No results found. No results for input(s): WBC, HGB, HCT, PLT in the last 72 hours.  Recent Labs  12/06/16 0541  NA 136  K 4.4  CL 102  GLUCOSE 148*  BUN 14  CREATININE 0.55*  CALCIUM 8.1*   CBG (last 3)  No results for input(s): GLUCAP in the last 72 hours.  Wt Readings from Last 3 Encounters:  12/05/16 75.3 kg (165 lb 14.6 oz)  11/22/16 67.6 kg (149 lb 0.5 oz)  10/12/16 74.8 kg (164 lb 12.8 oz)    Physical Exam:  HENT:  Head: Normocephalicand atraumatic.  Eyes: EOMare normal. Left eye exhibits no discharge.  Neck: Normal range of motion. Neck supple. No thyromegalypresent.  Cardiovascular: RRR without murmur. No JVD       Respiratory:  CTA Bilaterally without wheezes or rales. Normal effort       GI: BS +, non-tender, non-distended  Musculoskeletal: tenderness along right lower trap/levator scap Psychiatric: coopeartive Skin. Warm and dry Neurological:  Pt alert and oriented. Cognitively appropriate  RUE 5/5prox to distal, LUE  4+ to 5/5. RLE: 2+HF,  3- KE and 4-/5 ADF/PF---stable. LLE 2 to 2+/5 HF, 3- KE and 3+/5 ADF/PF--stable exam.  DTR's tr to 1+, normal sensory exam throughout  Assessment/Plan: 1. 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 Physiatrist is providing close team supervision and 24 hour management of active medical problems listed below. Physiatrist and rehab team  continue to assess barriers to discharge/monitor patient progress toward functional and medical goals.  Function:  Bathing Bathing position   Position: Shower  Bathing parts Body parts bathed by patient: Right arm, Left arm, Chest, Abdomen, Front perineal area, Right upper leg, Left upper leg, Buttocks Body parts bathed by helper: Right lower leg, Left lower leg, Back  Bathing assist        Upper Body Dressing/Undressing Upper body dressing   What is the patient wearing?: Pull over shirt/dress     Pull over shirt/dress - Perfomed by patient: Thread/unthread right sleeve, Thread/unthread left sleeve, Put head through opening, Pull shirt over trunk          Upper body assist Assist Level: Supervision or verbal cues      Lower Body Dressing/Undressing Lower body dressing   What is the patient wearing?: Pants, Non-skid slipper socks, Underwear Underwear - Performed by patient: Thread/unthread right underwear leg, Thread/unthread left underwear leg Underwear - Performed by helper: Pull underwear up/down Pants- Performed by patient: Thread/unthread right pants leg Pants- Performed by helper: Pull pants up/down, Thread/unthread left pants leg   Non-skid slipper socks- Performed by helper: Don/doff right sock, Don/doff left sock       Shoes - Performed by helper: Don/doff right shoe, Don/doff left shoe       TED Hose - Performed by helper: Don/doff right TED hose, Don/doff left TED hose  Lower body assist Assist for lower body dressing: Touching or steadying assistance (Pt >  75%)      Toileting Toileting   Toileting steps completed by patient: Adjust clothing prior to toileting, Performs perineal hygiene, Adjust clothing after toileting Toileting steps completed by helper: Adjust clothing prior to toileting, Performs perineal hygiene, Adjust clothing after toileting    Toileting assist Assist level: Two helpers   Transfers Chair/bed transfer   Chair/bed transfer method:  Squat pivot Chair/bed transfer assist level: Touching or steadying assistance (Pt > 75%) Chair/bed transfer assistive device: Armrests Mechanical lift: Stedy   Locomotion Ambulation     Max distance: 25 Assist level: Touching or steadying assistance (Pt > 75%)   Wheelchair   Type: Manual Max wheelchair distance: 150 ft Assist Level: Supervision or verbal cues  Cognition Comprehension Comprehension assist level: Follows complex conversation/direction with no assist  Expression Expression assist level: Expresses complex ideas: With extra time/assistive device  Social Interaction Social Interaction assist level: Interacts appropriately 90% of the time - Needs monitoring or encouragement for participation or interaction.  Problem Solving Problem solving assist level: Solves complex problems: With extra time  Memory Memory assist level: Complete Independence: No helper   Medical Problem List and Plan: 1. 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 -addressing proximal/hip girdle strength and stamina 2. DVT Prophylaxis/Anticoagulation: Subcutaneous Lovenox. Reiterated the need for lovenox until mobility improves.  3. Pain Management: Ultram 50 mg every 6 hours as needed, Zanaflex 2 mg every 8 hours as needed  -kpad/k-tap to right neck---likely muscle strain 4. Mood: Provide emotional support  -scheduled trazodone at night effective 5. Neuropsych: This patient iscapable of making decisions on hisown behalf. 6. Skin/Wound Care: Routine skin checks 7. Fluids/Electrolytes/Nutrition:   --replacing potassium --88meq daily  -potassium 4.4 on 10/4 8.Hypertension/hypotension: Lisinopril 10 mg daily, Toprol 25 mg daily.   -have spoken to patient and wife at length re: bp. Suspect some autonomic dysfunction as well.   -may need to resume low dose lisinopril if morning bp's run too  high  -KHT,  abdominal binder too as needed during day  -encouraging fluids  -bp's generally more stable 9.History of prostate cancer status post prostatectomy. CT chest abdomen and pelvis showed no findings typical for metastatic prostate cancer 10.Constipation. Moving bowels regularly now    LOS (Days) 13 A FACE TO FACE EVALUATION WAS PERFORMED  Meredith Staggers, MD 12/06/2016 9:23 AM

## 2016-12-06 NOTE — Progress Notes (Signed)
Physical Therapy Session Note  Patient Details  Name: Eduardo Burch MRN: [id number] Date of Birth: 1940/12/23  Today's Date: 12/06/2016 PT Individual Time: 1616-1700 PT Individual Time Calculation (min): 44 min   Short Term Goals: Week 2:  PT Short Term Goal 1 (Week 2): Pt will ambulate 50' modA PT Short Term Goal 2 (Week 2): Pt will perform bed mobility with S PT Short Term Goal 3 (Week 2): Pt will perform stand pivot transfers with modA PT Short Term Goal 4 (Week 2): Pt will initiate stair training  Skilled Therapeutic Interventions/Progress Updates:   Pt received supine in bed and agreeable to PT.    supine therex instructed by PT for BUE and BLE:  BLE: SLR AAROM x 10 Heel slides x 10  glute sets x 10  Ankle PF with level 2 tband x 20  Ankle DF x 15  Isometric hip adduction x 10  Hip abduction x 10  Clam shell with level 2 tband x 10  Reciprocal march x 10 with  Level 2 tband   BUE with 3 lb bar weight  Chest press x 20  tricep extension x 15  Bicep curl x 15  Lat pull down x 10   PT provided min verbal, tactile and visual cues for proper exercise technique including decreased compensatory movements and decreased speed of eccentric movement to improve stengthening aspect of movement.    Therapy Documentation Precautions:  Precautions Precautions: Fall Restrictions Weight Bearing Restrictions: No Vital Signs: Therapy Vitals Temp: 98.3 F (36.8 C) Temp Source: Oral Pulse Rate: 87 Resp: 20 BP: 139/70 Patient Position (if appropriate): Lying Oxygen Therapy SpO2: 99 % O2 Device: Not Delivered Pain:   1/10 low back.   See Function Navigator for Current Functional Status.   Therapy/Group: Individual Therapy  Lorie Phenix 12/06/2016, 5:00 PM

## 2016-12-06 NOTE — Progress Notes (Signed)
Occupational Therapy Note  Patient Details  Name: Eduardo Burch MRN: [id number] Date of Birth: March 16, 1940  Today's Date: 12/06/2016 OT Individual Time: 1300-1345 OT Individual Time Calculation (min): 45 min   Pt c/o tightness in lower back with activity; stretching and repositioned;  Individual Therapy  Pt resting in w/c upon arrival.  Pt propelled to gym and transferred to mat (supervision for scoot).  Pt attempted sit<>stand from 23" height.  Pt was able to complete with max A but BLE were not supportive.  Pt utilized BUE strength to push through sit<>stand but unable to maintain standing.  Pt engaged in trunk/back stretches (pt requested to lay onto mat and stated that after stretches his lower back was able to lay flat onto mat, which is the first time he can remember that being possible). before transitioning to [employer] for 10 mins (work load 5 at random).  Pt propelled back to room and requested to return to bed.  Pt unable to perform scoot transfer and utilized slide board to return to bed.  Pt remained in bed with all needs within reach.   Leotis Shames [facility] 12/06/2016, 2:53 PM

## 2016-12-07 ENCOUNTER — Inpatient Hospital Stay (HOSPITAL_COMMUNITY): Payer: Medicare Other

## 2016-12-07 ENCOUNTER — Inpatient Hospital Stay (HOSPITAL_COMMUNITY): Payer: Medicare Other | Admitting: Occupational Therapy

## 2016-12-07 ENCOUNTER — Telehealth: Payer: Self-pay | Admitting: Neurology

## 2016-12-07 ENCOUNTER — Inpatient Hospital Stay (HOSPITAL_COMMUNITY): Payer: Medicare Other | Admitting: Physical Therapy

## 2016-12-07 DIAGNOSIS — I951 Orthostatic hypotension: Secondary | ICD-10-CM

## 2016-12-07 MED ORDER — CYCLOBENZAPRINE HCL 5 MG PO TABS
5.0000 mg | ORAL_TABLET | Freq: Three times a day (TID) | ORAL | Status: DC | PRN
  Administered 2016-12-10 – 2016-12-13 (×3): 5 mg via ORAL
  Filled 2016-12-07 (×3): qty 1

## 2016-12-07 NOTE — Telephone Encounter (Signed)
He's had three nights where he hasn't slept well because of neck pain. This appears to be myofascial. I examined him in depth on Thursday and there was no change in his motor exam. My PA came to me after therapy had mentioned that he was not doing as well.   Interestingly, neither the patient nor his wife raised a concern to me this morning on rounds. He was somewhat irritable because he didn't sleep however. The irritability was new for him.

## 2016-12-07 NOTE — Telephone Encounter (Signed)
Please see my note below as well..... If you feel IVIG would be helpful, then that is fine with me. However, i've told the team before, his exam and clinical appearance is not consistent with CIDP. I would be happy to discus with you further next week.    Thanks

## 2016-12-07 NOTE — Telephone Encounter (Signed)
Return call to patient's wife who was with her husband at the rehabilitation facility. He reports having transient improvement with plasmapheresis and was making some progress with physical therapy early in his rehabilitation stay. For instance, he was able to walk 70 feet on day 3 of rehabilitation, however only 8 feet today. He has a lot of fatigue and gets tired with his therapy sessions. He has had improvement with upper extremity weakness which is positive. He is worried about the regression of his lower extremity strength.   I have reviewed his workup which is most suggestive with CIDP, even though electrophysiologic testing at [facility] did not find evidence of demyelinating neuropathy. However, he is not responding to IVIG or plasmapheresis. Paraneoplastic panel and sensorimotor autoimmune neuropathy panel is negative.  At this juncture, we can start maintenance dose of IVIG 1g/kg every 3 weeks.  If no improvement, may consider rituximab infusion or trial of corticosteroids and/or muscle and nerve biopsy.  I will discuss with Dr. Naaman Plummer to see what options for infusions are available at rehab.    Melanny Wire K. Posey Pronto, DO

## 2016-12-07 NOTE — Progress Notes (Signed)
Physical Therapy Weekly Progress Note  Patient Details  Name: Eduardo Burch MRN: [id number] Date of Birth: May 24, 1940  Beginning of progress report period: November 30, 2016 End of progress report period: December 07, 2016  Today's Date: 12/07/2016 PT Individual Time: 1000-1055 PT Individual Time Calculation (min): 55 min   Patient has met 0 of 4 short term goals.  Pt had met ambulation goal of 52' however has demonstrated functional decline over past several days and has been limited to <10' per ambulation trial with LE's feeling as though they are "giving out". Additionally, bed mobility had improved to minA for LLE however has declined to mod/maxA overall d/t apparent LE progressive weakness. Pt remains motivated to work hard and progress towards goals, however is frustrated by decline in mobility and increased sensation of worsening weakness.   Patient continues to demonstrate the following deficits muscle weakness, decreased cardiorespiratoy endurance, impaired timing and sequencing, unbalanced muscle activation and decreased coordination and decreased sitting balance, decreased standing balance, decreased postural control, decreased balance strategies and paraparesis and therefore will continue to benefit from skilled PT intervention to increase functional independence with mobility.  Patient progressing toward long term goals..  Continue plan of care.  PT Short Term Goals Week 2:  PT Short Term Goal 1 (Week 2): Pt will ambulate 50' modA PT Short Term Goal 1 - Progress (Week 2): Partly met PT Short Term Goal 2 (Week 2): Pt will perform bed mobility with S PT Short Term Goal 2 - Progress (Week 2): Progressing toward goal PT Short Term Goal 3 (Week 2): Pt will perform stand pivot transfers with modA PT Short Term Goal 3 - Progress (Week 2): Progressing toward goal PT Short Term Goal 4 (Week 2): Pt will initiate stair training PT Short Term Goal 4 - Progress (Week 2): Not met Week 3:  PT  Short Term Goal 1 (Week 3): =LTG due to estimated LOS  Skilled Therapeutic Interventions/Progress Updates: Pt received supine in bed, denies pain but does report fatigue. Requires minA for supine>sit after several unsuccessful attempts without assist. Squat pivot transfer minA to w/c. W/c propulsion modI with BUE. Squat pivot transfer w/c >mat x2 with modA; increased assist needed from therapist compared to previous sessions. Attempted ambulation x2 with max distance of 6-7' at a time before requiring urgent seated rest break in w/c d/t LE fatigue and pt feeling as if LEs were going to "give out". Pt voices concern about decline in strength, reports new onset tingling in chest and arms as well. Transferred to mat table modA squat pivot. Performed seated and supine LE strengthening exercises with AAROM as needed d/t strength deficits. Returned mat>w/c >bed modA squat pivot. MaxA sit >supine. Remained supine in bed at end of session, all needs in reach. PA alerted to therapists concerns about functional decline and increased assist needed for mobility activities over past several days.      Therapy Documentation Precautions:  Precautions Precautions: Fall Restrictions Weight Bearing Restrictions: No Pain: Pain Assessment Pain Assessment: 0-10 Pain Score: 0-No pain   See Function Navigator for Current Functional Status.  Therapy/Group: Individual Therapy  Luberta Mutter 12/07/2016, 10:54 AM

## 2016-12-07 NOTE — Progress Notes (Signed)
Occupational Therapy Session Note  Patient Details  Name: Eduardo Burch MRN: [id number] Date of Birth: 09/11/1940  Today's Date: 12/07/2016 OT Individual Time: 2376-2831 OT Individual Time Calculation (min): 43 min   Short Term Goals: Week 2:  OT Short Term Goal 1 (Week 2): Pt will perform squat pivot transfers to drop arm commode with mod assist.  OT Short Term Goal 2 (Week 2): Pt will perform swquat pivot tub bench transfers with mod A OT Short Term Goal 3 (Week 2): Pt will pull up pants over hips with mod A for standing  Skilled Therapeutic Interventions/Progress Updates:    OT treatment session focused on UB strengthening and community reintegration. Educated pt on wc positioning and wc safety within community environment. Pt able to negotiate elevator buttons, back wc onto elevator with supervision, and propel wc on graded surfaces outside. While outside, pt participated in UB  There-ex using grade 2 orange theraband and grade 3 green theraband for bicep curls, triceps press, and chest pulls 10x3 sets. Discussed home set-up and home modifications which pt reports they are currently remodeling their bathroom and adding ramps to home access. Pt returned to room at end of session with assistance for wc propulsion after departing elevator 2/2 max UE fatigue. Slideboard back to bed with supervision and min A for board placement. Pt left with call bell and needs met.   Therapy Documentation Precautions:  Precautions Precautions: Fall Restrictions Weight Bearing Restrictions: No Pain:  none/denies pain See Function Navigator for Current Functional Status.   Therapy/Group: Individual Therapy  Valma Cava 12/07/2016, 3:33 PM

## 2016-12-07 NOTE — Progress Notes (Signed)
Occupational Therapy Session Note  Patient Details  Name: Eduardo Burch MRN: [id number] Date of Birth: Dec 13, 1940  Today's Date: 12/07/2016 OT Individual Time: 4174-0814 OT Individual Time Calculation (min): 55 min    Short Term Goals: Week 2:  OT Short Term Goal 1 (Week 2): Pt will perform squat pivot transfers to drop arm commode with mod assist.  OT Short Term Goal 2 (Week 2): Pt will perform swquat pivot tub bench transfers with mod A OT Short Term Goal 3 (Week 2): Pt will pull up pants over hips with mod A for standing  Skilled Therapeutic Interventions/Progress Updates:    Pt resting in w/c upon arrival and propelled to gym.  Pt completed sit<>stand X 3 from 22" surface with mod A.  Pt requested to use toilet and returned to room.  Pt performed scoot transfer to drop arm BSC at supervision level and completed toileting tasks at supervision level.  Pt performs lateral leans for clothing management.  Pt returned to gym and attempted sit<>stand from 22" surface requiring increased assistance.  Pt commented that his arms were doing all the work.  Pt concerned that his strength is declining.  Pt returned to room and used slide board to transfer back to bed.  Pt attempted to use BLE to push self towards head of bed but required max A to complete task.  Pt remained in bed with all needs within reach.   Therapy Documentation Precautions:  Precautions Precautions: Fall Restrictions Weight Bearing Restrictions: No  Pain: Pain Assessment Pain Assessment: 0-10 Pain Score: 0-No pain  See Function Navigator for Current Functional Status.   Therapy/Group: Individual Therapy  Leroy Libman 12/07/2016, 10:42 AM

## 2016-12-07 NOTE — Progress Notes (Signed)
Occupational Therapy Session Note  Patient Details  Name: Eduardo Burch MRN: [id number] Date of Birth: December 06, 1940  Today's Date: 12/07/2016 OT Individual Time: 7121-9758 OT Individual Time Calculation (min): 30 min    Short Term Goals: Week 1:  OT Short Term Goal 1 (Week 1): Pt will complete sit to stand during LB dressing with max assist of one person. OT Short Term Goal 1 - Progress (Week 1): Met OT Short Term Goal 2 (Week 1): Pt will perform squat pivot transfers to drop arm commode with mod assist.  OT Short Term Goal 2 - Progress (Week 1): Progressing toward goal OT Short Term Goal 3 (Week 1): Pt will donn pants, socks, and shoes over feet with supervision in sitting and use of AE PRN.  OT Short Term Goal 3 - Progress (Week 1): Met  Skilled Therapeutic Interventions/Progress Updates: patient stated he was too fatigued to complete any activities involving his lower extremities and that his upper extremities were strong.    Thought he did state his right shoulder and neck "are sore from rolling around this wheelchair."  Patient concurred to working on core and trunk strengthening bed level.   However he required Total assist to go from supine with head of bed elevated  At approximately 25 degrees to a sit up.  He was unable to activite core muscles, even when this clinician assisted him with supine 25 degrees to come to a sit up.  He stated his back was very tight and the same forhis hamstrings.  He agreed to side ly while this clinician attempted to help stretch some of his lower back, gluttes and periformis muslces to help increase his flexibility in order to increase his bed mobility.  He was left side lying with call bell within reach at the end of the session.     Therapy Documentation Precautions:  Precautions Precautions: Fall Restrictions Weight Bearing Restrictions: No    Pain:denied    Therapy/Group: Individual Therapy  Alfredia Ferguson [facility] 12/07/2016, 6:25 PM

## 2016-12-07 NOTE — Telephone Encounter (Signed)
Wife called and stated that her husband is not going to be out of the hospital in time to make 10/9 appt.   Also, she would like to talk with Dr. Posey Pronto.  She said his legs are getting weaker and he is working very hard with PT.  PT also agreed.  She would like to discuss.  Please call

## 2016-12-07 NOTE — Progress Notes (Signed)
[place] PHYSICAL MEDICINE & REHABILITATION     PROGRESS NOTE    Subjective/Complaints:  Right neck no better. Has problems sleeping as a result. Wearing KT, using heat  ROS: pt denies nausea, vomiting, diarrhea, cough, shortness of breath or chest pain   Objective: Vital Signs: Blood pressure (!) 152/87, pulse 90, temperature 97.8 F (36.6 C), temperature source Oral, resp. rate 18, height 6' (1.829 m), weight 75.3 kg (165 lb 14.6 oz), SpO2 99 %. No results found. No results for input(s): WBC, HGB, HCT, PLT in the last 72 hours.  Recent Labs  12/06/16 0541  NA 136  K 4.4  CL 102  GLUCOSE 148*  BUN 14  CREATININE 0.55*  CALCIUM 8.1*   CBG (last 3)  No results for input(s): GLUCAP in the last 72 hours.  Wt Readings from Last 3 Encounters:  12/05/16 75.3 kg (165 lb 14.6 oz)  11/22/16 67.6 kg (149 lb 0.5 oz)  10/12/16 74.8 kg (164 lb 12.8 oz)    Physical Exam:  HENT:  Head: Normocephalicand atraumatic.  Eyes: EOMare normal. Left eye exhibits no discharge.  Neck: Normal range of motion. Neck supple. No thyromegalypresent.  Cardiovascular: RRR without murmur. No JVD        Respiratory:  CTA Bilaterally without wheezes or rales. Normal effort        GI: BS +, non-tender, non-distended   Musculoskeletal: tenderness along right lower trap/levator scap Psychiatric: coopeartive Skin. Warm and dry Neurological:  Pt alert and oriented. Cognitively appropriate  RUE 5/5prox to distal, LUE  4+ to 5/5. RLE: 2+HF,  3- KE and 4-/5 ADF/PF---stable. LLE 2 to 2+/5 HF, 3- KE and 3+/5 ADF/PF-no changes in motor exam.  DTR's tr to 1+, normal sensory exam throughout  Assessment/Plan: 1. 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 Physiatrist is providing close team supervision and 24 hour management of active medical problems listed below. Physiatrist and rehab team continue to  assess barriers to discharge/monitor patient progress toward functional and medical goals.  Function:  Bathing Bathing position   Position: Shower  Bathing parts Body parts bathed by patient: Right arm, Left arm, Chest, Abdomen, Front perineal area, Right upper leg, Left upper leg, Buttocks Body parts bathed by helper: Right lower leg, Left lower leg, Back  Bathing assist        Upper Body Dressing/Undressing Upper body dressing   What is the patient wearing?: Pull over shirt/dress     Pull over shirt/dress - Perfomed by patient: Thread/unthread right sleeve, Thread/unthread left sleeve, Put head through opening, Pull shirt over trunk          Upper body assist Assist Level: Supervision or verbal cues      Lower Body Dressing/Undressing Lower body dressing   What is the patient wearing?: Pants, Non-skid slipper socks, Underwear Underwear - Performed by patient: Thread/unthread right underwear leg, Thread/unthread left underwear leg Underwear - Performed by helper: Pull underwear up/down Pants- Performed by patient: Thread/unthread right pants leg Pants- Performed by helper: Pull pants up/down, Thread/unthread left pants leg   Non-skid slipper socks- Performed by helper: Don/doff right sock, Don/doff left sock       Shoes - Performed by helper: Don/doff right shoe, Don/doff left shoe       TED Hose - Performed by helper: Don/doff right TED hose, Don/doff left TED hose  Lower body assist Assist for lower body dressing: Touching or steadying assistance (  Pt > 75%)      Toileting Toileting   Toileting steps completed by patient: Adjust clothing prior to toileting, Performs perineal hygiene, Adjust clothing after toileting Toileting steps completed by helper: Adjust clothing prior to toileting, Performs perineal hygiene, Adjust clothing after toileting    Toileting assist Assist level: Two helpers   Transfers Chair/bed transfer   Chair/bed transfer method: Squat  pivot Chair/bed transfer assist level: Touching or steadying assistance (Pt > 75%) Chair/bed transfer assistive device: Armrests Mechanical lift: Stedy   Locomotion Ambulation     Max distance: 6' Assist level: Touching or steadying assistance (Pt > 75%)   Wheelchair   Type: Manual Max wheelchair distance: 150 ft Assist Level: Supervision or verbal cues  Cognition Comprehension Comprehension assist level: Follows complex conversation/direction with no assist  Expression Expression assist level: Expresses complex ideas: With extra time/assistive device  Social Interaction Social Interaction assist level: Interacts appropriately 90% of the time - Needs monitoring or encouragement for participation or interaction.  Problem Solving Problem solving assist level: Solves complex problems: With extra time  Memory Memory assist level: Complete Independence: No helper   Medical Problem List and Plan: 1. 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 -strength/stamina slowly improving 2. DVT Prophylaxis/Anticoagulation: Subcutaneous Lovenox. Reiterated the need for lovenox until mobility improves.  3. Pain Management: Ultram 50 mg every 6 hours as needed,   -kpad/k-tap to right neck---myofascial pain  -add prn flexeril 4. Mood: Provide emotional support  -scheduled trazodone at night effective 5. Neuropsych: This patient iscapable of making decisions on hisown behalf. 6. Skin/Wound Care:  Continue nutrition/local measures 7. Fluids/Electrolytes/Nutrition:   --continue-47meq daily  -potassium 4.4 on 10/4 8.Hypertension/hypotension: Lisinopril 10 mg daily, Toprol 25 mg daily.   -have spoken to patient and wife at length re: bp. Suspect some autonomic dysfunction as well.   -may need to resume low dose lisinopril if morning bp's run too high  -KHT,  abdominal binder too as needed  during day  -encouraging fluids  -bp's generally more consistent 9.History of prostate cancer status post prostatectomy. CT chest abdomen and pelvis showed no findings typical for metastatic prostate cancer 10.Constipation. Moving bowels regularly now    LOS (Days) 14 A FACE TO FACE EVALUATION WAS PERFORMED  Alger Simons T, MD 12/07/2016 8:50 AM

## 2016-12-08 NOTE — Progress Notes (Signed)
Patient ID: Eduardo Burch, male   DOB: [birth year], 76 y.o.   MRN: [id number]   12/08/2016.  Eduardo Burch is a 76 y.o. 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 Constipated   . Elevated blood pressure reading   . 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 Hypercholesterolemia   . Hypertension   . Spondylosis       Subjective:  Doing well.  Feels that he may have had a mild setback in his lower extremity weakness  Objective: Vital signs in last 24 hours: Temp:  [97.5 F (36.4 C)-98 F (36.7 C)] 97.5 F (36.4 C) (10/06 0446) Pulse Rate:  [90-96] 90 (10/06 0446) Resp:  [18-19] 18 (10/06 0446) BP: (130-169)/(73-81) 169/81 (10/06 0446) SpO2:  [98 %-100 %] 100 % (10/06 0446) Weight change:  Last BM Date: 12/07/16  Intake/Output from previous day: 10/05 0701 - 10/06 0700 In: 544 [P.O.:544] Out: 200 [Urine:200]  BP Readings from Last 3 Encounters:  12/08/16 (!) 169/81  11/23/16 139/75  11/12/16 110/60    Physical Exam General: No apparent distress   HEENT: not dry Lungs: Normal effort. Lungs clear to auscultation, no crackles or wheezes. Cardiovascular: Regular rate and rhythm, no edema Abdomen: S/NT/ND; BS(+) Musculoskeletal:  unchanged Neurological: Alert and appropriate; lower extremity weakness Wounds: N/A    Extremities: No edema Mental state: Alert, oriented, cooperative    Lab Results: BMET    Component Value Date/Time   NA 136 12/06/2016 0541   NA 139 08/16/2016 1106   K 4.4 12/06/2016 0541   CL 102 12/06/2016 0541   CO2 27 12/06/2016 0541   GLUCOSE 148 (H) 12/06/2016 0541   BUN 14 12/06/2016 0541   BUN 20 08/16/2016 1106   CREATININE 0.55 (L) 12/06/2016 0541   CALCIUM 8.1 (L) 12/06/2016 0541   GFRNONAA >60 12/06/2016 0541   GFRAA >60 12/06/2016 0541   CBC    Component Value Date/Time   WBC 8.4 11/26/2016 0608   RBC 3.68 (L) 11/26/2016 0608   HGB 11.9 (L)  11/26/2016 0608   HCT 36.1 (L) 11/26/2016 0608   PLT 485 (H) 11/26/2016 0608   MCV 98.1 11/26/2016 0608   MCH 32.3 11/26/2016 0608   MCHC 33.0 11/26/2016 0608   RDW 15.2 11/26/2016 0608   LYMPHSABS 2.1 11/26/2016 0608   MONOABS 0.8 11/26/2016 0608   EOSABS 0.2 11/26/2016 0608   BASOSABS 0.0 11/26/2016 0608    Studies/Results: No results found.  Medications: I have reviewed the patient's current medications.  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  Hypertension.  Continue present regimen DVT prophylaxis.  Continue Lovenox until mobility improves  Length of stay, days: [place] , MD 12/08/2016, 9:43 AM

## 2016-12-09 ENCOUNTER — Inpatient Hospital Stay (HOSPITAL_COMMUNITY): Payer: Medicare Other | Admitting: *Deleted

## 2016-12-09 NOTE — Progress Notes (Signed)
Physical Therapy Session Note  Patient Details  Name: Eduardo Burch MRN: [id number] Date of Birth: 12-28-1940  Today's Date: 12/09/2016 PT Individual Time: 1010-1110 PT Individual Time Calculation (min): 60 min   Short Term Goals: Week 3:  PT Short Term Goal 1 (Week 3): =LTG due to estimated LOS  Skilled Therapeutic Interventions/Progress Updates:    Tx focused on functional mobility training, standing tolerance in standing frame, strengthening with HEP handouts, and WC propulsion for activity tolerance. Pt up in [facility], ready to go, feeling very frustrated at the little clarity surrounding his diagnosis and tx. Pt complaining of various tingling in face, numbness in feet, and weakness throughout tx. RN made aware and pt encouraged to write down his variety of symptoms to better communicate with his health care team.    Pt propelled WC 2x150' in controlled setting with assist requested for parts management.  Pt performed 2 lateral scoot transfers during tx and was encouraged to use board to avoid sheering of skin across wheel.  Sit<>stand with max A to lift to RW. Gait x5' with mod/max A to steady due to decreased hip/knee ext strength. WC closely follow. Pt educated on benefit of RW vs rollator at this stage for increased support.   Standing frame 1x5 min and 1x2.5 min, activity tolerance limited by orthostatic changed. BP dropped to 74/47 during second trial. Pt encouraged to use abdominal binder during the day, in addition to his TED hose. BP rebounded to WNL in 5 min.   Sit>supine with Mod A for bil LEs.  Supine HEP initiated for ankle pumps, heel slides, SAQ, and glute/quad sets x10 each.  Pt left in bed with all needs in reach.  Therapy Documentation Precautions:  Precautions Precautions: Fall Restrictions Weight Bearing Restrictions: No General:   Vital Signs: Therapy Vitals Pulse Rate: (!) 106 BP: 96/60 Patient Position (if appropriate): Sitting Pain: Pain Assessment Pain  Assessment: No/denies pain   See Function Navigator for Current Functional Status.   Therapy/Group: Individual Therapy  Yoskar Murrillo, Corinna Lines, PT, DPT  12/09/2016, 10:40 AM

## 2016-12-09 NOTE — Progress Notes (Signed)
Patient ID: Eduardo Burch, male   DOB: 1940-05-23, 76 y.o.   MRN: [id number]   12/09/16.  Eduardo Burch is a 76 y.o. male admitted for CIR with functional deficits related to suspected CIDP   Subjective: No new complaints.  Still having concerns about possible setback and lower extremity weakness. Wife had phone contact with neurology yesterday  Objective: Vital signs in last 24 hours: Temp:  [97.5 F (36.4 C)-98 F (36.7 C)] 98 F (36.7 C) (10/07 0542) Pulse Rate:  [95-99] 95 (10/07 0542) Resp:  [18] 18 (10/07 0542) BP: (132-153)/(79-83) 153/83 (10/07 0542) SpO2:  [99 %] 99 % (10/07 0542) Weight change:  Last BM Date: 12/08/16  Intake/Output from previous day: 10/06 0701 - 10/07 0700 In: 680 [P.O.:680] Out: 400 [Urine:400] Last cbgs: CBG (last 3)  No results for input(s): GLUCAP in the last 72 hours.   Physical Exam General: No apparent distress   HEENT: not dry Lungs: Normal effort. Lungs clear to auscultation, no crackles or wheezes. Cardiovascular: Regular rate and rhythm, no edema Abdomen: S/NT/ND; BS(+) Musculoskeletal:  unchanged Neurological: No new neurological deficits.  Unchanged lower extremity weakness.  While sitting, the patient cannot fully extend either leg and hold horizontally against gravity Wounds: N/A    Skin: clear Mental state: Alert, oriented, cooperative  BP Readings from Last 3 Encounters:  12/09/16 (!) 153/83  11/23/16 139/75  11/12/16 110/60    Lab Results: BMET    Component Value Date/Time   NA 136 12/06/2016 0541   NA 139 08/16/2016 1106   K 4.4 12/06/2016 0541   CL 102 12/06/2016 0541   CO2 27 12/06/2016 0541   GLUCOSE 148 (H) 12/06/2016 0541   BUN 14 12/06/2016 0541   BUN 20 08/16/2016 1106   CREATININE 0.55 (L) 12/06/2016 0541   CALCIUM 8.1 (L) 12/06/2016 0541   GFRNONAA >60 12/06/2016 0541   GFRAA >60 12/06/2016 0541   CBC    Component Value Date/Time   WBC 8.4 11/26/2016 0608   RBC 3.68 (L) 11/26/2016 0608   HGB  11.9 (L) 11/26/2016 0608   HCT 36.1 (L) 11/26/2016 0608   PLT 485 (H) 11/26/2016 0608   MCV 98.1 11/26/2016 0608   MCH 32.3 11/26/2016 0608   MCHC 33.0 11/26/2016 0608   RDW 15.2 11/26/2016 0608   LYMPHSABS 2.1 11/26/2016 0608   MONOABS 0.8 11/26/2016 0608   EOSABS 0.2 11/26/2016 0608   BASOSABS 0.0 11/26/2016 0608    Studies/Results: No results found.  Medications: I have reviewed the patient's current medications.  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  Neurology considering maintenance IVIG every 3 weeks, possibly rituximab infusion or steroid therapy.  Essential hypertension.  No change in therapy.  Blood pressure continues to run a bit high DVT prophylaxis continue Lovenox    Length of stay, days: [place] , MD 12/09/2016, 9:02 AM

## 2016-12-10 ENCOUNTER — Inpatient Hospital Stay (HOSPITAL_COMMUNITY): Payer: Medicare Other

## 2016-12-10 ENCOUNTER — Telehealth: Payer: Self-pay | Admitting: Neurology

## 2016-12-10 ENCOUNTER — Inpatient Hospital Stay (HOSPITAL_COMMUNITY): Payer: Medicare Other | Admitting: Physical Therapy

## 2016-12-10 ENCOUNTER — Ambulatory Visit: Payer: Medicare Other | Admitting: Physical Therapy

## 2016-12-10 DIAGNOSIS — R29898 Other symptoms and signs involving the musculoskeletal system: Secondary | ICD-10-CM

## 2016-12-10 LAB — COMPREHENSIVE METABOLIC PANEL
ALBUMIN: 2.5 g/dL — AB (ref 3.5–5.0)
ALT: 18 U/L (ref 17–63)
ANION GAP: 7 (ref 5–15)
AST: 22 U/L (ref 15–41)
Alkaline Phosphatase: 64 U/L (ref 38–126)
BILIRUBIN TOTAL: 0.3 mg/dL (ref 0.3–1.2)
BUN: 17 mg/dL (ref 6–20)
CO2: 26 mmol/L (ref 22–32)
Calcium: 8.3 mg/dL — ABNORMAL LOW (ref 8.9–10.3)
Chloride: 102 mmol/L (ref 101–111)
Creatinine, Ser: 0.5 mg/dL — ABNORMAL LOW (ref 0.61–1.24)
GFR calc Af Amer: >60 mL/min (ref >60)
GLUCOSE: 93 mg/dL (ref 65–99)
POTASSIUM: 4.7 mmol/L (ref 3.5–5.1)
Sodium: 135 mmol/L (ref 135–145)
Total Protein: 5.2 g/dL — ABNORMAL LOW (ref 6.5–8.1)

## 2016-12-10 LAB — CBC WITH DIFFERENTIAL/PLATELET
BASOS PCT: 0 %
Basophils Absolute: 0 10*3/uL (ref 0.0–0.1)
EOS PCT: 1 %
Eosinophils Absolute: 0.1 10*3/uL (ref 0.0–0.7)
HEMATOCRIT: 42.9 % (ref 39.0–52.0)
Hemoglobin: 14.3 g/dL (ref 13.0–17.0)
Lymphocytes Relative: 24 %
Lymphs Abs: 1.8 10*3/uL (ref 0.7–4.0)
MCH: 32.7 pg (ref 26.0–34.0)
MCHC: 33.3 g/dL (ref 30.0–36.0)
MCV: 98.2 fL (ref 78.0–100.0)
MONO ABS: 0.6 10*3/uL (ref 0.1–1.0)
MONOS PCT: 8 %
NEUTROS ABS: 5.1 10*3/uL (ref 1.7–7.7)
Neutrophils Relative %: 67 %
Platelets: 449 10*3/uL — ABNORMAL HIGH (ref 150–400)
RBC: 4.37 MIL/uL (ref 4.22–5.81)
RDW: 14.2 % (ref 11.5–15.5)
WBC: 7.7 10*3/uL (ref 4.0–10.5)

## 2016-12-10 NOTE — Progress Notes (Signed)
Occupational Therapy Note  Patient Details  Name: Eduardo Burch MRN: [id number] Date of Birth: 04-Jan-1941  Today's Date: 12/10/2016 OT Missed Time: 33 Minutes Missed Time Reason: Patient fatigue  Pt missed 30 mins skilled OT services secondary to fatigue.  Pt resting in bed following PT session and stated that he was unable to participate.    Leotis Shames [facility] 12/10/2016, 2:10 PM

## 2016-12-10 NOTE — Progress Notes (Signed)
Occupational Therapy Session Note  Patient Details  Name: Corday Wyka MRN: [id number] Date of Birth: [birth year]  Today's Date: 12/10/2016 OT Individual Time: 0800-0900 OT Individual Time Calculation (min): 60 min    Short Term Goals: Week 2:  OT Short Term Goal 1 (Week 2): Pt will perform squat pivot transfers to drop arm commode with mod assist.  OT Short Term Goal 2 (Week 2): Pt will perform swquat pivot tub bench transfers with mod A OT Short Term Goal 3 (Week 2): Pt will pull up pants over hips with mod A for standing  Skilled Therapeutic Interventions/Progress Updates:    Pt resting in w/c upon arrival.  Initital focus on sit>stand from 22" surface (4), 21" surface (1), and 20" surface with max A for final task.  BLE stretching with focus on hamstrings followed by sit<>stand from 20" with mod A.  Pt noted increased BLE use after stretching.  Pt transitioned to [employer] for 7 mins at work load 6.  Pt returned to room and performed slide board transfer at supervision level.   Therapy Documentation Precautions:  Precautions Precautions: Fall Restrictions Weight Bearing Restrictions: No   Pain: Pain Assessment Pain Assessment: 0-10 Pain Score: 6  Pain Type: Acute pain Pain Location: Back Pain Orientation: Lower Pain Descriptors / Indicators: Aching Pain Frequency: Intermittent Pain Onset: On-going Patients Stated Pain Goal: 2 Pain Intervention(s): Meds admin prior to therapy Multiple Pain Sites: No  See Function Navigator for Current Functional Status.   Therapy/Group: Individual Therapy  Leroy Libman 12/10/2016, 12:12 PM

## 2016-12-10 NOTE — Progress Notes (Addendum)
Physical Therapy Note  Patient Details  Name: Eduardo Burch MRN: [id number] Date of Birth: 08/04/1940 Today's Date: 12/10/2016  1015-1105, 50 min individual tx; missed 10 min due to fatigue Pain: 3/10 low back; premedicated  tx focused on core activation, LE strengthening  and bed mobility, dipahragmatic breathing and counting aloud during exs. PT donned TEDS. ( Abdominal binder not used as majority of session pt was in bed.)  Therapeutic exercises performed with trunk and LEs to increase strength for functional mobility: in supine, 10 x 1 each- pelvic tilts, bil bridging (active assistive) , bil lower trunk rotation with strap around thighs, ankle pumps, ankle eversion, bil glut sets with 5 second hold, lower abdominal/pelvic floor contractions.  In L/R side lying, active assistive bil hp flex/ext.  Bed mobility for rolling without rails, in flat bed, and scooting laterally in flat bed using upper/lower trunk dissociation. In sitting: R/L lateral leans, and scooting laterally on bed.  Training for sit> stand promoting forward wt shifting, loading bil LEs, with max assist to stand , x 20 seconds with bil UE support.  Bil LEs noted to be trembling, and pt sat suddenly.  Pt stated he was exhausted and could not do anything else.  Pt left resting in bed with all needs within reach.   See function navigator for current status.  Kyaire Gruenewald 12/10/2016, 10:38 AM

## 2016-12-10 NOTE — Progress Notes (Signed)
Physical Therapy Session Note  Patient Details  Name: Eduardo Burch MRN: [id number] Date of Birth: 1941/01/31  Today's Date: 12/10/2016 PT Individual Time: 1300-1400 PT Individual Time Calculation (min): 60 min   Short Term Goals: Week 3:  PT Short Term Goal 1 (Week 3): =LTG due to estimated LOS  Skilled Therapeutic Interventions/Progress Updates: Pt received supine in bed, denies pain and agreeable to treatment. Supine>sit with minA, HOB elevated and bedrails. Squat pivot transfer throughout session with min guard. W/c propulsion to gym modI with BUE for strengthening and endurance. Sit <>stand x8 reps from edge of elevated mat table with rollator and mod/maxA. Attempted marching in place x2 trials, remaining trials pt stood statically 30-60 seconds before fatigued and requiring seated rest break. Hamstring stretch BLE with contract-relax and soft tissue massage to increase extensibility. Rolling R/L with manual resistance applied at pelvis for core activation/coordination; 2x10 reps B sides. Supine>sit modA for trunk management. Returned to room modI w/c propulsion modI. Transfer w/c>bed with slideboard; pt able to place board independently. [place] sit >supine for BLE management. Remained semi-reclined at end of session, all needs in reach.      Therapy Documentation Precautions:  Precautions Precautions: Fall Restrictions Weight Bearing Restrictions: No   See Function Navigator for Current Functional Status.   Therapy/Group: Individual Therapy  Luberta Mutter 12/10/2016, 7:40 AM

## 2016-12-10 NOTE — Telephone Encounter (Signed)
Discussed patient's care with Dr. Naaman Plummer.  Given the lack of motor improvement of the legs despite IVIG and plasmapheresis and inconsistent EDX/exam for classic presentation of CIDP, further testing with muscle and nerve biopsy is indicated.    Recommend LEFT sural nerve and LEFT vastus lateral muscle biopsy.  With his weight loss, intravascular lymphoma is a possibility. Although very low likelihood, acetylcholine receptor antibodies will also be checked.   Further management options will be based on the results of his biopsy.   I have personally discussed the plan with Mr. & Mrs. Tritz who are in agreement with our plan.   Appreciate Dr. Charm Barges care in the management of this patient.   Kerrie Latour K. Posey Pronto, DO

## 2016-12-10 NOTE — Progress Notes (Signed)
[facility] PHYSICAL MEDICINE & REHABILITATION     PROGRESS NOTE    Subjective/Complaints:  Pt had a fair weekend. Was able to sleep. Feels a little stronger today. At the end of the week and Saturday he felt that therapy was more of a struggle with poor exercise tolerance  ROS: pt denies nausea, vomiting, diarrhea, cough, shortness of breath or chest pain   Objective: Vital Signs: Blood pressure 132/68, pulse 96, temperature 97.8 F (36.6 C), temperature source Oral, resp. rate 18, height 6' (1.829 m), weight 75.3 kg (165 lb 14.6 oz), SpO2 100 %. No results found.  Recent Labs  12/10/16 0926  WBC 7.7  HGB 14.3  HCT 42.9  PLT 449*    Recent Labs  12/10/16 0926  NA 135  K 4.7  CL 102  GLUCOSE 93  BUN 17  CREATININE 0.50*  CALCIUM 8.3*   CBG (last 3)  No results for input(s): GLUCAP in the last 72 hours.  Wt Readings from Last 3 Encounters:  12/05/16 75.3 kg (165 lb 14.6 oz)  11/22/16 67.6 kg (149 lb 0.5 oz)  10/12/16 74.8 kg (164 lb 12.8 oz)    Physical Exam:  HENT:  Head: Normocephalicand atraumatic.  Eyes: EOMare normal. Left eye exhibits no discharge.  Neck: Normal range of motion. Neck supple. No thyromegalypresent.  Cardiovascular: RRR without murmur. No JVD         Respiratory:  CTA Bilaterally without wheezes or rales. Normal effort        GI: BS +, non-tender, non-distended   Musculoskeletal: tenderness along right lower trap/levator scap Psychiatric: coopeartive Skin. Warm and dry Neurological:  Pt alert and oriented. Cognitively appropriate  RUE 4+ to 5/5prox to distal, LUE  4+ to 5/5. RLE: 2+HF,  3- KE and 4-/5 ADF/PF---no changes. LLE 2 to 2+/5 HF, 3- KE and 3+/5 ADF/PF-no motor changes  DTR's tr to 1+, normal sensory exam throughout upper and lower extremities.   Assessment/Plan: 1. 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 Physiatrist is  providing close team supervision and 24 hour management of active medical problems listed below. Physiatrist and rehab team continue to assess barriers to discharge/monitor patient progress toward functional and medical goals.  Function:  Bathing Bathing position   Position: Wheelchair/chair at sink  Bathing parts Body parts bathed by patient: Right arm, Left arm, Chest, Abdomen, Front perineal area, Right upper leg, Left upper leg, Buttocks Body parts bathed by helper: Right lower leg, Left lower leg, Back  Bathing assist        Upper Body Dressing/Undressing Upper body dressing   What is the patient wearing?: Pull over shirt/dress     Pull over shirt/dress - Perfomed by patient: Thread/unthread right sleeve, Thread/unthread left sleeve, Put head through opening, Pull shirt over trunk          Upper body assist Assist Level: Supervision or verbal cues      Lower Body Dressing/Undressing Lower body dressing   What is the patient wearing?: Pants, Non-skid slipper socks, Underwear Underwear - Performed by patient: Thread/unthread right underwear leg, Thread/unthread left underwear leg Underwear - Performed by helper: Pull underwear up/down Pants- Performed by patient: Thread/unthread right pants leg Pants- Performed by helper: Pull pants up/down, Thread/unthread left pants leg   Non-skid slipper socks- Performed by helper: Don/doff right sock, Don/doff left sock       Shoes - Performed by helper: Don/doff right shoe,  Don/doff left shoe       TED Hose - Performed by helper: Don/doff right TED hose, Don/doff left TED hose  Lower body assist Assist for lower body dressing: Touching or steadying assistance (Pt > 75%)      Toileting Toileting   Toileting steps completed by patient: Adjust clothing prior to toileting, Performs perineal hygiene Toileting steps completed by helper: Adjust clothing after toileting    Toileting assist Assist level: More than reasonable time,  Touching or steadying assistance (Pt.75%)   Transfers Chair/bed transfer   Chair/bed transfer method: Lateral scoot Chair/bed transfer assist level: Supervision or verbal cues Chair/bed transfer assistive device: Armrests, Sliding board Mechanical lift: Ecologist     Max distance: 5 Assist level: 2 helpers   Wheelchair   Type: Manual Max wheelchair distance: 150 ft Assist Level: Supervision or verbal cues  Cognition Comprehension Comprehension assist level: Understands basic 90% of the time/cues < 10% of the time  Expression Expression assist level: Expresses basic 90% of the time/requires cueing < 10% of the time.  Social Interaction Social Interaction assist level: Interacts appropriately 75 - 89% of the time - Needs redirection for appropriate language or to initiate interaction.  Problem Solving Problem solving assist level: Solves basic 90% of the time/requires cueing < 10% of the time  Memory Memory assist level: Complete Independence: No helper   Medical Problem List and Plan: 1. 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 -strength/stamina had shown some gradual improvement until the end of the week. However team noticing worsening exercise tolerance   -exam and prior work up not really consistent with CIDP  -I have spoken to Dr. Posey Pronto and the patient at length regarding considerations  -pursuing muscle/nerve biopsy. Also sending off ACh ab testing. ?could he presenting with  mild parkinsonian symptoms as well especially considering orthostasis we've seen. ? 2. DVT Prophylaxis/Anticoagulation: Subcutaneous Lovenox. Reiterated the need for lovenox until mobility improves.  3. Pain Management: Ultram 50 mg every 6 hours as needed,   -kpad/k-tap to right neck---myofascial pain  -add prn flexeril 4. Mood: Provide emotional support  -scheduled  trazodone at night effective 5. Neuropsych: This patient iscapable of making decisions on hisown behalf. 6. Skin/Wound Care:  Continue nutrition/local measures 7. Fluids/Electrolytes/Nutrition:   --continue-17meq daily  -potassium 4.4 on 10/4 8.Hypertension/hypotension: Lisinopril 10 mg daily, Toprol 25 mg daily.   -have spoken to patient and wife at length re: bp. Suspect some autonomic dysfunction as well.   -may need to resume low dose lisinopril if morning bp's run too high  -KHT,  abdominal binder too as needed during day  -encouraging fluids  -bp's generally more consistent 9.History of prostate cancer status post prostatectomy. CT chest abdomen and pelvis showed no findings typical for metastatic prostate cancer  -he has gained a modest amount of weight so far here 10.Constipation. Moving bowels regularly now    LOS (Days) 17 A FACE TO FACE EVALUATION WAS PERFORMED  Meredith Staggers, MD 12/10/2016 12:54 PM

## 2016-12-11 ENCOUNTER — Inpatient Hospital Stay (HOSPITAL_COMMUNITY): Payer: Medicare Other | Admitting: Occupational Therapy

## 2016-12-11 ENCOUNTER — Ambulatory Visit: Payer: Medicare Other | Admitting: Neurology

## 2016-12-11 ENCOUNTER — Inpatient Hospital Stay (HOSPITAL_COMMUNITY): Payer: Medicare Other

## 2016-12-11 ENCOUNTER — Inpatient Hospital Stay (HOSPITAL_COMMUNITY): Payer: Medicare Other | Admitting: Physical Therapy

## 2016-12-11 MED ORDER — SORBITOL 70 % SOLN: 60.0000 mL | Freq: Once | Status: DC

## 2016-12-11 NOTE — Progress Notes (Signed)
Physical Therapy Session Note  Patient Details  Name: Eduardo Burch MRN: [id number] Date of Birth: 30-Sep-1940  Today's Date: 12/11/2016 PT Individual Time: 1045-1200 PT Individual Time Calculation (min): 75 min   Short Term Goals: Week 3:  PT Short Term Goal 1 (Week 3): =LTG due to estimated LOS  Skilled Therapeutic Interventions/Progress Updates: Pt received supine in bed, denies pain and agreeable to treatment. Supine>sit with HOB flat using bedrails and S. Transfer to w/c S squat pivot. Pt able to maneuver w/c within room to retrieve and don legrests. W/c propulsion throughout session modI with BUEs. Pt wife arrived to observe remainder of session. Performed car transfer to 24" seat height to simulate pt's car. Performed with S overall. Transitioned to ultra-lightweight w/c to trial, and provided w/c gloves to improve comfort and efficiency with propulsion. Pt reports significantly improved ease of propulsion, reduced energy demand, preserve shoulder stability/integrity. Discussed benefits and various options available for ultralightweight w/c's, and how therapist's recommendation for equipment at this time is uncertain pending diagnosis/prognosis. Performed transfer w/c <>bed with transfer board to 23" bed height to simulate home environment. Required cues for problem solving sit <>supine and significantly increased time. Ultimately required minA to bring trunk forward and up to midline with HOB elevated (pt's HOB at home does elevate). Performed seated hamstring stretch in w/c; provided handout to pt for continued performance. Discussed benefits of home health vs outpatient; pt and wife learning toward HHPT at this time to assist with transition home, assess pt in the home environment, and reduce energy expenditure initially upon return home as leaving the home would require extensive effort. Pt remained seated in w/c at end of session, all needs in reach.      Therapy Documentation Precautions:   Precautions Precautions: Fall Restrictions Weight Bearing Restrictions: No  See Function Navigator for Current Functional Status.   Therapy/Group: Individual Therapy  Luberta Mutter 12/11/2016, 12:01 PM

## 2016-12-11 NOTE — Progress Notes (Signed)
Occupational Therapy Session Note  Patient Details  Name: Eduardo Burch MRN: [id number] Date of Birth: 1940/04/25  Today's Date: 12/11/2016 OT Individual Time: 0800-0900 OT Individual Time Calculation (min): 60 min    Short Term Goals: Week 3:  OT Short Term Goal 1 (Week 3): STG=LTGs secondary to ELOS  Skilled Therapeutic Interventions/Progress Updates:    Focus on sit<>stand, BLE stretching, scoot transfers, and ongoing discharge planning with wife.  Discussed equipment needs.  Pt performed sit<>stand at 22" X 1 with mod A, 21" X 1 with max A, and was unable to perform sit<>stand from 20". Pt returned to room and transferred to bed with slide board at supervision level. Pt remained in bed with all needs within reach.   Therapy Documentation Precautions:  Precautions Precautions: Fall Restrictions Weight Bearing Restrictions: No Pain:  Pt c/o BLE "tightness"; stretching  See Function Navigator for Current Functional Status.   Therapy/Group: Individual Therapy  Leroy Libman 12/11/2016, 10:07 AM

## 2016-12-11 NOTE — Progress Notes (Signed)
Occupational Therapy Weekly Progress Note  Patient Details  Name: Eduardo Burch MRN: [id number] Date of Birth: 1940-12-27  Beginning of progress report period: December 04, 2016 End of progress report period: December 11, 2016  Patient has met 0 of 3 short term goals.  Pt has made steady progress with bathing/dressing tasks at w/c and bed level requiring min A for LB clothing management.  Pt continues to exhibit BLE weakness and requires max A for squat pivot transfers.  Pt is able to perform scoot/slide board transfers with min A/supervision.    Patient continues to demonstrate the following deficits: muscle weakness, decreased cardiorespiratoy endurance, impaired timing and sequencing, unbalanced muscle activation and decreased coordination and decreased standing balance and decreased balance strategies and therefore will continue to benefit from skilled OT intervention to enhance overall performance with BADL and Reduce care partner burden.  Patient progressing toward long term goals..  Continue plan of care.  OT Short Term Goals Week 2:  OT Short Term Goal 1 (Week 2): Pt will perform squat pivot transfers to drop arm commode with mod assist.  OT Short Term Goal 1 - Progress (Week 2): Progressing toward goal OT Short Term Goal 2 (Week 2): Pt will perform swquat pivot tub bench transfers with mod A OT Short Term Goal 2 - Progress (Week 2): Progressing toward goal OT Short Term Goal 3 (Week 2): Pt will pull up pants over hips with mod A for standing OT Short Term Goal 3 - Progress (Week 2): Progressing toward goal Week 3:  OT Short Term Goal 1 (Week 3): STG=LTGs secondary to ELOS      Therapy Documentation Precautions:  Precautions Precautions: Fall Restrictions Weight Bearing Restrictions: No  See Function Navigator for Current Functional Status.     Leotis Shames [facility] 12/11/2016, 7:00 AM

## 2016-12-11 NOTE — Patient Care Conference (Signed)
Inpatient RehabilitationTeam Conference and Plan of Care Update Date: 12/11/2016   Time: 2:05 PM    Patient Name: Eduardo Burch      Medical Record Number: [id number]  Date of Birth: August 29, 1940 Sex: Male         Room/Bed: 4W10C/4W10C-01 Payor Info: Payor: Marine scientist / Plan: [employer] MEDICARE / Product Type: *No Product type* /    Admitting Diagnosis: GBS VS CIDP  Admit Date/Time:  11/23/2016  1:13 PM Admission Comments: No comment available   Primary Diagnosis:  CIDP (chronic inflammatory demyelinating polyneuropathy) ([facility]) Principal Problem: CIDP (chronic inflammatory demyelinating polyneuropathy) ([place])  Patient Active Problem List   Diagnosis Date Noted  . Ankle pain 11/19/2016  . Hyperbilirubinemia 11/17/2016  . Leukocytosis 11/14/2016  . Hypophosphatemia 11/14/2016  . Dysphagia 11/14/2016  . Abnormal LFTs 11/14/2016  . Lower extremity weakness 11/13/2016  . CIDP (chronic inflammatory demyelinating polyneuropathy) ([place]) 11/12/2016  . Back pain without sciatica 10/25/2016  . 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 Hyperlipidemia 10/03/2016  . H/O prostate cancer 10/03/2016  . Hyponatremia 10/03/2016  . Leg weakness 09/28/2016  . Constipation   . Tachycardia 08/16/2016  . Hypertension, essential 08/15/2016    Expected Discharge Date: Expected Discharge Date: 12/15/16  Team Members Present: Physician leading conference: Dr. Alger Simons Social Worker Present: Lennart Pall, LCSW Nurse Present: Other (comment) Gabriel Carina, RN) PT Present: Kem Parkinson, PT OT Present: Willeen Cass, OT;Roanna Epley, COTA SLP Present: Charolett Bumpers, SLP PPS Coordinator present : Daiva Nakayama, RN, [facility]     Current Status/Progress Goal Weekly Team Focus  Medical   improving strength. bp variable/drops when fatigued, some autonomic dysfunction  increase balance  bp stabilization, pain control   Bowel/Bladder   Continent of bowel/bladder. LBM 10/6  Remain continent of  bowel/bladder  monitor for changes in bowel/bladder function.   Swallow/Nutrition/ Hydration             ADL's   bathing-min A with lateral leans; LB dsg-min A sitting EOB with lateral leans; toileting-min A; toilet transfers-min A for scoot transfer; BLE fatigue quickly  bathing-min A; UB dsge-S; LB dge-min A; toilet/shower transfers-min A; toileitng-min A  functional transfers, BADL retraining, discharge planning, family educaiton, activity tolerance   Mobility   maxA sit <>stand, gait regressed to <10' before fatigued, minA bed mobility, S transfers  min assist overall; mod I for wheelchair mobility, supervision bed mobility   activity from a w/c level, activity tolerance, gait as able   Communication             Safety/Cognition/ Behavioral Observations            Pain   Denied pain during night shift. Complained of pain during day shift, tramadol PRN given  <2  Assess and treat pain q shift and as needed   Skin   No skin issues  No skin breakdown while in rehab  Monitor skin q shift and as needed    Rehab Goals Patient on target to meet rehab goals: Yes *See Care Plan and progress notes for long and short-term goals.     Barriers to Discharge  Current Status/Progress Possible Resolutions Date Resolved   Physician    Medical stability        bp regulation, teds/binder      Nursing                  PT  OT                  SLP                SW                Discharge Planning/Teaching Needs:  Plan home with his spouse who "is there 99% of the time" per pt.  Teaching to be completed this week   Team Discussion:  Initial making good progress but this seems to have slowed.  MD not sure we have correct diagnosis with CIDP;  Has consulted neurology and plan for nerve/ muscle biopsy this week.  Anticipate scoot, sliding board transfers (no squat-pivot).  Focus on w/c level mobility.  UB strength is good but quickly fatigues.    Revisions to Treatment  Plan:  Plan to d/c ambulation goals.    Continued Need for Acute Rehabilitation Level of Care: The patient requires daily medical management by a physician with specialized training in physical medicine and rehabilitation for the following conditions: Daily direction of a multidisciplinary physical rehabilitation program to ensure safe treatment while eliciting the highest outcome that is of practical value to the patient.: Yes Daily medical management of patient stability for increased activity during participation in an intensive rehabilitation regime.: Yes Daily analysis of laboratory values and/or radiology reports with any subsequent need for medication adjustment of medical intervention for : Neurological problems;Blood pressure problems  Baptiste Littler 12/11/2016, 4:50 PM

## 2016-12-11 NOTE — Progress Notes (Signed)
Occupational Therapy Note  Patient Details  Name: Eduardo Burch MRN: [id number] Date of Birth: 01/02/1941  Today's Date: 12/11/2016 OT Individual Time: 0930-1000 OT Individual Time Calculation (min): 30 min   No c/o pain  Pt seen this session to facilitate LE strength needed to improve sit to stand for increased I with self care.  Pt was in bed and opted to do exercises in bed as the session time was only 30 min.   Bed exercises: B knees bent: -with theraband around thighs for hip abd              -ball squeezes for hip add                         - knee drops to 45 degrees each side for oblique strengthening Using slide board on bed - pt worked on AROM of knee flex/ext followed by resisted knee extension Legs extended on bed - AROM of internal/ external rotation   Pt tolerated exercises well. Resting in bed with all needs met.     SAGUIER,JULIA 12/11/2016, 12:23 PM   

## 2016-12-11 NOTE — Progress Notes (Signed)
Occupational Therapy Session Note  Patient Details  Name: Eduardo Burch MRN: [id number] Date of Birth: 1940/11/15  Today's Date: 12/11/2016 OT Individual Time: 1430-1500 OT Individual Time Calculation (min): 30 min    Short Term Goals: Week 1:  OT Short Term Goal 1 (Week 1): Pt will complete sit to stand during LB dressing with max assist of one person. OT Short Term Goal 1 - Progress (Week 1): Met OT Short Term Goal 2 (Week 1): Pt will perform squat pivot transfers to drop arm commode with mod assist.  OT Short Term Goal 2 - Progress (Week 1): Progressing toward goal OT Short Term Goal 3 (Week 1): Pt will donn pants, socks, and shoes over feet with supervision in sitting and use of AE PRN.  OT Short Term Goal 3 - Progress (Week 1): Met Week 2:  OT Short Term Goal 1 (Week 2): Pt will perform squat pivot transfers to drop arm commode with mod assist.  OT Short Term Goal 1 - Progress (Week 2): Progressing toward goal OT Short Term Goal 2 (Week 2): Pt will perform swquat pivot tub bench transfers with mod A OT Short Term Goal 2 - Progress (Week 2): Progressing toward goal OT Short Term Goal 3 (Week 2): Pt will pull up pants over hips with mod A for standing OT Short Term Goal 3 - Progress (Week 2): Progressing toward goal Week 3:  OT Short Term Goal 1 (Week 3): STG=LTGs secondary to ELOS  Skilled Therapeutic Interventions/Progress Updates:    1:1 Focus on toilet transfers laterally to/from w/c with close supervision. Pt able to get down clothing and perform pericare.  Pt required a to pull pants. Pt performed slide board transfer back to bed with supervision with A for setup of board placement.   Therapy Documentation Precautions:  Precautions Precautions: Fall Restrictions Weight Bearing Restrictions: No Pain:  c/o fatigue  See Function Navigator for Current Functional Status.   Therapy/Group: Individual Therapy  Willeen Cass [facility] 12/11/2016, 3:44 PM

## 2016-12-11 NOTE — Progress Notes (Signed)
[place] PHYSICAL MEDICINE & REHABILITATION     PROGRESS NOTE    Subjective/Complaints:  Pt still struggling with fatigue in therapies. Only able to stand currently. Denies pain. Upper body feels stable.   ROS: pt denies nausea, vomiting, diarrhea, cough, shortness of breath or chest pain   Objective: Vital Signs: Blood pressure 135/80, pulse 88, temperature 98 F (36.7 C), temperature source Oral, resp. rate 16, height 6' (1.829 m), weight 75.3 kg (165 lb 14.6 oz), SpO2 100 %. No results found.  Recent Labs  12/10/16 0926  WBC 7.7  HGB 14.3  HCT 42.9  PLT 449*    Recent Labs  12/10/16 0926  NA 135  K 4.7  CL 102  GLUCOSE 93  BUN 17  CREATININE 0.50*  CALCIUM 8.3*   CBG (last 3)  No results for input(s): GLUCAP in the last 72 hours.  Wt Readings from Last 3 Encounters:  12/05/16 75.3 kg (165 lb 14.6 oz)  11/22/16 67.6 kg (149 lb 0.5 oz)  10/12/16 74.8 kg (164 lb 12.8 oz)    Physical Exam:  HENT:  Head: Normocephalicand atraumatic.  Eyes: EOMare normal. Left eye exhibits no discharge.  Neck: Normal range of motion. Neck supple.  Cards: RRR without murmur. No JVD          Respiratory:  CTA Bilaterally without wheezes or rales. Normal effort  Musculoskeletal: tenderness along right lower trap/levator scap Psychiatric: coopeartive Skin. Warm and dry Neurological:  Pt alert and oriented. Cognitively appropriate  RUE 4+ to 5/5prox to distal, LUE  4+ to 5/5. RLE: 2+HF,  3- KE and 4-/5 ADF/PF---no changes. LLE 2 to 2+/5 HF, 3- KE and 3+/5 ADF/PF-exam stable today  DTR's tr to 1+, normal sensory exam throughout upper and lower extremities.   Assessment/Plan: 1. 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 Physiatrist is providing close team supervision and 24 hour management of active medical problems listed below. Physiatrist and rehab team continue to assess  barriers to discharge/monitor patient progress toward functional and medical goals.  Function:  Bathing Bathing position   Position: Wheelchair/chair at sink  Bathing parts Body parts bathed by patient: Right arm, Left arm, Chest, Abdomen, Front perineal area, Right upper leg, Left upper leg, Buttocks Body parts bathed by helper: Right lower leg, Left lower leg, Back  Bathing assist        Upper Body Dressing/Undressing Upper body dressing   What is the patient wearing?: Pull over shirt/dress     Pull over shirt/dress - Perfomed by patient: Thread/unthread right sleeve, Thread/unthread left sleeve, Put head through opening, Pull shirt over trunk          Upper body assist Assist Level: Supervision or verbal cues      Lower Body Dressing/Undressing Lower body dressing   What is the patient wearing?: Pants, Non-skid slipper socks, Underwear Underwear - Performed by patient: Thread/unthread right underwear leg, Thread/unthread left underwear leg Underwear - Performed by helper: Pull underwear up/down Pants- Performed by patient: Thread/unthread right pants leg Pants- Performed by helper: Pull pants up/down, Thread/unthread left pants leg   Non-skid slipper socks- Performed by helper: Don/doff right sock, Don/doff left sock       Shoes - Performed by helper: Don/doff right shoe, Don/doff left shoe       TED Hose - Performed by helper: Don/doff right TED hose, Don/doff left TED hose  Lower body assist Assist for lower  body dressing: Touching or steadying assistance (Pt > 75%)      Toileting Toileting   Toileting steps completed by patient: Adjust clothing prior to toileting, Performs perineal hygiene Toileting steps completed by helper: Adjust clothing prior to toileting, Performs perineal hygiene, Adjust clothing after toileting Toileting Assistive Devices: Grab bar or rail  Toileting assist Assist level: Two helpers   Transfers Chair/bed transfer   Chair/bed  transfer method: Lateral scoot Chair/bed transfer assist level: Supervision or verbal cues Chair/bed transfer assistive device: Armrests, Sliding board Mechanical lift: Ecologist     Max distance: 5 Assist level: 2 helpers   Wheelchair   Type: Manual Max wheelchair distance: 150 ft Assist Level: No help, No cues, assistive device, takes more than reasonable amount of time  Cognition Comprehension Comprehension assist level: Understands basic 90% of the time/cues < 10% of the time  Expression Expression assist level: Expresses basic needs/ideas: With extra time/assistive device  Social Interaction Social Interaction assist level: Interacts appropriately 90% of the time - Needs monitoring or encouragement for participation or interaction.  Problem Solving Problem solving assist level: Solves basic 90% of the time/requires cueing < 10% of the time  Memory Memory assist level: Recognizes or recalls 90% of the time/requires cueing < 10% of the time   Medical Problem List and Plan: 1. 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 -strength/stamina had shown some gradual improvement until the end of the week. However team noticing worsening exercise tolerance   -exam and prior work up not really consistent with CIDP  -I have spoken to Dr. Posey Pronto and the patient at length regarding considerations  -  muscle/nerve biopsy pending. Also sent off [facility] ab testing. ?could he presenting with  mild parkinsonian symptoms as well especially considering orthostasis we've seen. ?  -further discussion with pt/wife today 2. DVT Prophylaxis/Anticoagulation: Subcutaneous Lovenox. Reiterated the need for lovenox until mobility improves.  3. Pain Management: Ultram 50 mg every 6 hours as needed,   -kpad/k-tap to right neck---myofascial pain  -add prn flexeril 4. Mood: Provide  emotional support  -scheduled trazodone at night effective 5. Neuropsych: This patient iscapable of making decisions on hisown behalf. 6. Skin/Wound Care:  Continue nutrition/local measures 7. Fluids/Electrolytes/Nutrition:   --continue-35meq daily  -potassium 4.4 on 10/4 8.Hypertension/hypotension: Lisinopril 10 mg daily, Toprol 25 mg daily.   - toleate higher resting/am pressures  -KHT,  abdominal binder too as needed during day  -encouraging fluids    9.History of prostate cancer status post prostatectomy. CT chest abdomen and pelvis showed no findings typical for metastatic prostate cancer  -he has gained a modest amount of weight so far here although albumin is still low. Discussed importance of nutrition 10.Constipation. Moving bowels regularly now    LOS (Days) 18 A FACE TO FACE EVALUATION WAS PERFORMED  Alger Simons T, MD 12/11/2016 9:02 AM

## 2016-12-12 ENCOUNTER — Inpatient Hospital Stay (HOSPITAL_COMMUNITY): Payer: Medicare Other

## 2016-12-12 ENCOUNTER — Inpatient Hospital Stay (HOSPITAL_COMMUNITY): Payer: Medicare Other | Admitting: Physical Therapy

## 2016-12-12 ENCOUNTER — Telehealth: Payer: Self-pay | Admitting: Neurology

## 2016-12-12 MED ORDER — CHLORHEXIDINE GLUCONATE CLOTH 2 % EX PADS: 6.0000 | MEDICATED_PAD | Freq: Once | CUTANEOUS | Status: DC

## 2016-12-12 MED ORDER — CEFAZOLIN SODIUM-DEXTROSE 2-4 GM/100ML-% IV SOLN: 2.0000 g | INTRAVENOUS | Status: DC

## 2016-12-12 NOTE — Progress Notes (Signed)
Occupational Therapy Session Note  Patient Details  Name: Eduardo Burch MRN: [id number] Date of Birth: 23-Jan-1941  Today's Date: 12/12/2016 OT Individual Time: 0800-0900 OT Individual Time Calculation (min): 60 min    Short Term Goals: Week 2:  OT Short Term Goal 1 (Week 2): Pt will perform squat pivot transfers to drop arm commode with mod assist.  OT Short Term Goal 1 - Progress (Week 2): Progressing toward goal OT Short Term Goal 2 (Week 2): Pt will perform swquat pivot tub bench transfers with mod A OT Short Term Goal 2 - Progress (Week 2): Progressing toward goal OT Short Term Goal 3 (Week 2): Pt will pull up pants over hips with mod A for standing OT Short Term Goal 3 - Progress (Week 2): Progressing toward goal Week 3:  OT Short Term Goal 1 (Week 3): STG=LTGs secondary to ELOS  Skilled Therapeutic Interventions/Progress Updates:    Pt resting in w/c upon arrival with wife present. Pt/wife stated that she had assisted pt with sponge bath and dressing prior to therapy.  Pt's wife verified that she had provided min A for LB bathing/dressing tasks using later leans seated EOB. Initial focus on continued discharge planning and DME recommendations.  Drop arm BSC recommended and CSW notified to order equipment.  Discussed home safety and importance of establishing a schedule at home.  Pt transitioned to Dayroom and engaged in Dewy Rose therex with free weights.  Pt return demonstrated therex for biceps/triceps, deltoids, and trunk rotation in addition to stretching to maintain relative flexibility.  Pt also return demonstrated w/c pushups.  Pt returned to room and remained in w/c with wife present.   Therapy Documentation Precautions:  Precautions Precautions: Fall Restrictions Weight Bearing Restrictions: No  Pain: Pain Assessment Pain Assessment: 0-10 Pain Score: 3  Pain Location: Back Pain Orientation: Lower Pain Descriptors / Indicators: Aching Pain Frequency: Intermittent Pain  Onset: On-going Patients Stated Pain Goal: 2 Pain Intervention(s): Meds admin prior to therapy Multiple Pain Sites: No    See Function Navigator for Current Functional Status.   Therapy/Group: Individual Therapy  Leroy Libman 12/12/2016, 10:44 AM

## 2016-12-12 NOTE — Consult Note (Signed)
[facility] Surgery Consult Note  Eduardo Burch 1940-09-17  [id number].    Requesting MD: Naaman Plummer Chief Complaint/Reason for Consult: Muscle biopsy HPI:  Patient is a 76 y/o male who presented with a 5 month history of progressing BLE weakness and decrease in functional mobility. Patient states he initially just noticed some low back pain and then began to feel progressively weaker and had a fall. 50 lb weight loss over the past few months. Has had extensive workup including MRI, PET, and EMG studies. Initially tried on IVIG for possible GBS, completed 5 day course of plasmapheresis 9/19 for suspected CIDP. Admitted to [facility] 9/21. Dr. Narda Amber [facility] neurology) recommending biopsy of left vastus lateralis and left sural nerve to confirm suspected diagnosis of CIDP. He is frustrated at not having a diagnosis. Patient reports persistent weakness in BLE although feels like he has had a better day working with therapies today. Reports some numbness in feet, but no other sensory deficits. Denies fever, chills, chest pain, SOB, abdominal or urinary sxs.   PMH significant for spondylosis, HTN, hypercholesteremia, Hx of prostate CA.   ROS: Review of Systems  Constitutional: Positive for weight loss. Negative for chills and fever.  Respiratory: Negative for shortness of breath and wheezing.   Cardiovascular: Negative for chest pain and palpitations.  Gastrointestinal: Negative for abdominal pain, diarrhea, nausea and vomiting.  Genitourinary: Negative for dysuria, frequency and urgency.  Musculoskeletal: Positive for back pain and falls.  Neurological: Positive for sensory change (occasional numbness in feet) and focal weakness (bilateral LE weakness). Negative for dizziness, tingling, tremors, speech change, seizures, loss of consciousness and headaches.  All other systems reviewed and are negative.   Family History  Problem Relation Age of Onset  . Lung cancer Mother   . Spondylitis Father      Past Medical History:  Diagnosis Date  . Constipated   . Elevated blood pressure reading   . 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 Hypercholesterolemia   . Hypertension   . Spondylosis     Past Surgical History:  Procedure Laterality Date  . PROATATECTOMY      Social History:  reports that he has quit smoking. His smoking use included Cigarettes. He has never used smokeless tobacco. He reports that he does not drink alcohol or use drugs.  Allergies:  Allergies  Allergen Reactions  . Remicade [Infliximab] Anaphylaxis    Medications Prior to Admission  Medication Sig Dispense Refill  . acetaminophen (TYLENOL) 500 MG tablet Take 500-1,000 mg by mouth every 6 (six) hours as needed for headache.    Marland Kitchen aspirin 81 MG chewable tablet Chew 1 tablet (81 mg total) by mouth daily. 30 tablet 0  . bisacodyl (DULCOLAX) 5 MG EC tablet Take 1 tablet (5 mg total) by mouth daily as needed for moderate constipation. 30 tablet 0  . docusate sodium (COLACE) 100 MG capsule Take 1 capsule (100 mg total) by mouth every 12 (twelve) hours. 60 capsule 0  . gabapentin (NEURONTIN) 300 MG capsule Take 300 mg by mouth at bedtime.    Marland Kitchen lisinopril (PRINIVIL,ZESTRIL) 10 MG tablet Take 1 tablet (10 mg total) by mouth daily. 90 tablet 3  . metoprolol succinate (TOPROL XL) 25 MG 24 hr tablet Take 1 tablet (25 mg total) by mouth daily. 90 tablet 3  . tiZANidine (ZANAFLEX) 2 MG tablet Take 2 mg by mouth 3 (three) times daily.  5  . traMADol (ULTRAM) 50 MG tablet Take 1  tablet (50 mg total) by mouth every 6 (six) hours as needed for moderate pain or severe pain. 30 tablet 0  . UNABLE TO FIND OUTPATIENT PHYSICAL THERAPY AND OCCUPATIONAL THERAPY  Dx: Guillain-Barre  Evaluation and Treat 1 each 0    Blood pressure (!) 147/82, pulse 88, temperature 97.9 F (36.6 C), temperature source Oral, resp. rate 16, height 6' (1.829 m), weight 75.4 kg (166 lb 2.5 oz), SpO2 99 %. Physical  Exam: Physical Exam  Constitutional: He is oriented to person, place, and time. He appears well-developed and well-nourished. He is cooperative.  Non-toxic appearance. No distress.  HENT:  Head: Normocephalic and atraumatic.  Right Ear: External ear normal.  Left Ear: External ear normal.  Nose: Nose normal.  Mouth/Throat: Oropharynx is clear and moist and mucous membranes are normal. Normal dentition.  Eyes: Pupils are equal, round, and reactive to light. Conjunctivae, EOM and lids are normal. No scleral icterus.  Neck: Normal range of motion and phonation normal. Neck supple. No thyromegaly present.  Cardiovascular: Normal rate and regular rhythm.   Pulses:      Radial pulses are 2+ on the right side, and 2+ on the left side.       Dorsalis pedis pulses are 2+ on the right side, and 2+ on the left side.  No lower extremity edema  Pulmonary/Chest: Effort normal and breath sounds normal.  Abdominal: Soft. Normal appearance and bowel sounds are normal. He exhibits no distension and no mass. There is no hepatosplenomegaly. There is no tenderness. There is no rigidity and no guarding. No hernia.  Musculoskeletal:       Right shoulder: He exhibits normal range of motion and normal strength.       Left shoulder: He exhibits normal range of motion and normal strength.       Right hip: He exhibits decreased strength (4/5). He exhibits no tenderness, no swelling and no deformity.       Left hip: He exhibits decreased strength (4/5). He exhibits no tenderness, no swelling and no deformity.       Right ankle: He exhibits normal range of motion, no swelling and no deformity.       Left ankle: He exhibits normal range of motion, no swelling and no deformity.  Neurological: He is alert and oriented to person, place, and time. No sensory deficit. GCS eye subscore is 4. GCS verbal subscore is 5. GCS motor subscore is 6.  Skin: Skin is warm, dry and intact. No rash noted. He is not diaphoretic. No pallor.   Psychiatric: His speech is normal and behavior is normal.  Flat affect.     No results found for this or any previous visit (from the past 48 hour(s)). No results found.    Assessment/Plan Muscle biopsy for AIDP/CIDP - will proceed with muscle bx in OR tomorrow - obtain informed consent - NPO after midnight, Ancef periop  Nerve biopsy - I have spoken with ortho, vascular, neurosurgery and none do nerve biopsies. [facility] does not do nerve biopsies. If this is desired consider tertiary center.   FEN: NPO after MN VTE: SCDs, hold lovenox tomorrow ID: ancef periop  Brigid Re, [facility] Surgery 12/12/2016, 11:27 AM Pager: [phone] Consults: [phone] Mon-Fri 7:00 am-4:30 pm Sat-Sun 7:00 am-11:30 am

## 2016-12-12 NOTE — Progress Notes (Signed)
Request made to general surgery at the request of neurology services Dr.Donika Patel for muscle nerve biopsy right sural nerve right vastus lateralis muscle nerve biopsy for workup differential of CIDP. Please call neurology [phone] for questions

## 2016-12-12 NOTE — Telephone Encounter (Signed)
Dr. Charm Barges team will be contact general surgery for the biopsy.

## 2016-12-12 NOTE — Progress Notes (Signed)
Occupational Therapy Note  Patient Details  Name: Eduardo Burch MRN: [id number] Date of Birth: May 20, 1940  Today's Date: 12/12/2016 OT Individual Time: 1400-1430 OT Individual Time Calculation (min): 30 min   Pt denies pain Individual Therapy  Pt resting in w/c upon arrival.  Focus on BUE therex with Theraband.  Pt return demonstrated exercises for shoulder flexion, shoulder extension, trunk rotation, triceps/biceps, and deltoids.  Handouts to be provided during session tomorrow.  Pt used slide board for transfer back to bed to rest before next scheduled therapy.     Leotis Shames [facility] 12/12/2016, 2:35 PM

## 2016-12-12 NOTE — Telephone Encounter (Signed)
Patient's wife called regarding him being in [facility] and needing to know who is ordering the Biopsy? Please Advise. Thanks

## 2016-12-12 NOTE — Telephone Encounter (Signed)
PA Dan for Dr Naaman Plummer at [facility] is calling : re: a Muscle Nerve Biopsy he states that Dr Naaman Plummer wants to know if it can be done at the hospital or only in office.  Linna Hoff is asking that Dr Naaman Plummer be called directly at [phone] is the mobile # for Dr Naaman Plummer)

## 2016-12-12 NOTE — Telephone Encounter (Signed)
Please advise 

## 2016-12-12 NOTE — Telephone Encounter (Signed)
Mrs. Seufert said that Dr. Naaman Plummer is waiting for you to make arrangements for the Bx.  She would like for you to talk to him please.

## 2016-12-12 NOTE — Plan of Care (Signed)
Problem: RH Bed to Chair Transfers Goal: LTG Patient will perform bed/chair transfers w/assist (PT) LTG: Patient will perform bed/chair transfers with assistance, with/without cues (PT).  Upgraded d/t progress towards goal  Problem: RH Ambulation Goal: LTG Patient will ambulate in controlled environment (PT) LTG: Patient will ambulate in a controlled environment, # of feet with assistance (PT).  Downgraded d/t functional decline Goal: LTG Patient will ambulate in home environment (PT) LTG: Patient will ambulate in home environment, # of feet with assistance (PT).  Outcome: Not Applicable Date Met: 57/33/44 D/c due to functional decline; pt to be w/c level in home  Problem: RH Stairs Goal: LTG Patient will ambulate up and down stairs w/assist (PT) LTG: Patient will ambulate up and down # of stairs with assistance (PT)  Outcome: Not Applicable Date Met: 83/01/59 d/c due to slow progress, ramp built at home

## 2016-12-12 NOTE — Progress Notes (Signed)
[facility] PHYSICAL MEDICINE & REHABILITATION     PROGRESS NOTE    Subjective/Complaints:  Pt worked really hard in therapies yesterday. Hamstrings quite sore this morning as a result. Wife gave me a list of more symptoms and he notes intermittent tingling in his feet as well as trunk/legs at times.   ROS: pt denies nausea, vomiting, diarrhea, cough, shortness of breath or chest pain   Objective: Vital Signs: Blood pressure (!) 147/82, pulse 88, temperature 97.9 F (36.6 C), temperature source Oral, resp. rate 16, height 6' (1.829 m), weight 75.4 kg (166 lb 2.5 oz), SpO2 99 %. No results found.  Recent Labs  12/10/16 0926  WBC 7.7  HGB 14.3  HCT 42.9  PLT 449*    Recent Labs  12/10/16 0926  NA 135  K 4.7  CL 102  GLUCOSE 93  BUN 17  CREATININE 0.50*  CALCIUM 8.3*   CBG (last 3)  No results for input(s): GLUCAP in the last 72 hours.  Wt Readings from Last 3 Encounters:  12/12/16 75.4 kg (166 lb 2.5 oz)  11/22/16 67.6 kg (149 lb 0.5 oz)  10/12/16 74.8 kg (164 lb 12.8 oz)    Physical Exam:  HENT:  Head: Normocephalicand atraumatic.  Eyes: EOMare normal. Left eye exhibits no discharge.  Neck: Normal range of motion. Neck supple.  Cards: RRR without murmur. No JVD           Respiratory:  CTA Bilaterally without wheezes or rales. Normal effort   Musculoskeletal: tenderness along right lower trap/levator scap Psychiatric: coopeartive Skin. Warm and dry Neurological:  Pt alert and oriented. Cognitively appropriate  RUE 4+ to 5/5prox to distal, LUE  4+ to 5/5. RLE: 2+HF,  3- KE and 4-/5 ADF/PF---motor exam essentially unchanged although he guarded somewhat with hamstring tightness. There are NO sensory findings in his lower extremities. Marland Kitchen LLE 2 to 2+/5 HF, 3- KE and 3+/5 ADF/PF-exam stable today  DTR's tr to absent  Assessment/Plan: 1. 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 Physiatrist is providing close team supervision and 24 hour management of active medical problems listed below. Physiatrist and rehab team continue to assess barriers to discharge/monitor patient progress toward functional and medical goals.  Function:  Bathing Bathing position   Position: Wheelchair/chair at sink  Bathing parts Body parts bathed by patient: Right arm, Left arm, Chest, Abdomen, Front perineal area, Right upper leg, Left upper leg, Buttocks Body parts bathed by helper: Right lower leg, Left lower leg, Back  Bathing assist        Upper Body Dressing/Undressing Upper body dressing   What is the patient wearing?: Pull over shirt/dress     Pull over shirt/dress - Perfomed by patient: Thread/unthread right sleeve, Thread/unthread left sleeve, Put head through opening, Pull shirt over trunk          Upper body assist Assist Level: Supervision or verbal cues      Lower Body Dressing/Undressing Lower body dressing   What is the patient wearing?: Pants, Non-skid slipper socks, Underwear Underwear - Performed by patient: Thread/unthread right underwear leg, Thread/unthread left underwear leg Underwear - Performed by helper: Pull underwear up/down Pants- Performed by patient: Thread/unthread right pants leg Pants- Performed by helper: Pull pants up/down, Thread/unthread left pants leg   Non-skid slipper socks- Performed by helper: Don/doff right sock, Don/doff left sock       Shoes - Performed by helper: Don/doff  right shoe, Don/doff left shoe       TED Hose - Performed by helper: Don/doff right TED hose, Don/doff left TED hose  Lower body assist Assist for lower body dressing: Touching or steadying assistance (Pt > 75%)      Toileting Toileting   Toileting steps completed by patient: Adjust clothing prior to toileting, Performs perineal hygiene Toileting steps completed by helper: Adjust clothing prior to toileting, Performs perineal hygiene,  Adjust clothing after toileting Toileting Assistive Devices: Grab bar or rail  Toileting assist Assist level: Two helpers   Transfers Chair/bed transfer   Chair/bed transfer method: Lateral scoot Chair/bed transfer assist level: Supervision or verbal cues Chair/bed transfer assistive device: Armrests, Sliding board Mechanical lift: Ecologist     Max distance: 5 Assist level: 2 helpers   Wheelchair   Type: Manual Max wheelchair distance: 150 ft Assist Level: No help, No cues, assistive device, takes more than reasonable amount of time  Cognition Comprehension Comprehension assist level: Follows complex conversation/direction with no assist  Expression Expression assist level: Expresses complex ideas: With no assist  Social Interaction Social Interaction assist level: Interacts appropriately with others - No medications needed.  Problem Solving Problem solving assist level: Solves basic 90% of the time/requires cueing < 10% of the time  Memory Memory assist level: Recognizes or recalls 90% of the time/requires cueing < 10% of the time   Medical Problem List and Plan: 1. 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 -strength/stamina had shown some gradual improvement until the end of the week. However team noticing worsening exercise tolerance   -exam and prior work up not really consistent with CIDP  -I have spoken to Dr. Posey Pronto and the patient at length regarding considerations  -    ACh ab testing pending.  -Our team will contact surgery re: setting up bx (I thought Dr. Posey Pronto was contacting them).   -also displays  Parkinson-like symptoms/orthostasis  -having daily treatment discussions with wife 2. DVT Prophylaxis/Anticoagulation: Subcutaneous Lovenox. Reiterated the need for lovenox until mobility improves.  3. Pain Management: Ultram 50 mg every 6  hours as needed,   -kpad/k-tap to right neck---myofascial pain  -add prn flexeril 4. Mood: Provide emotional support  -scheduled trazodone at night effective 5. Neuropsych: This patient iscapable of making decisions on hisown behalf. 6. Skin/Wound Care:  Continue nutrition/local measures 7. Fluids/Electrolytes/Nutrition:   --continue-49meq daily  -potassium 4.4 on 10/4 8.Hypertension/hypotension: Lisinopril 10 mg daily, Toprol 25 mg daily.   - toleate higher resting/am pressures  -KHT,  abdominal binder too as needed during day  -encouraging fluids    9.History of prostate cancer status post prostatectomy. CT chest abdomen and pelvis showed no findings typical for metastatic prostate cancer  -he has gained a modest amount of weight so far here although albumin is still low. Discussed importance of nutrition 10.Constipation. Moving bowels regularly now    LOS (Days) 19 A FACE TO FACE EVALUATION WAS PERFORMED  Meredith Staggers, MD 12/12/2016 8:52 AM

## 2016-12-12 NOTE — Progress Notes (Signed)
Pt resting in bed quietly. Easily aroused. C/o back and leg pain and rates 8(10) on numerical scale. Educated on the use of flexeril also to assist in obtaining the best pain control with a longer lasting effect as he asks for pain management exactly every 6 hours. Able to make needs known. Looking forward to discharge on 12/15/16. Safety maintained. callbell within reach. willl continue to monitor.

## 2016-12-12 NOTE — Telephone Encounter (Signed)
Spoke with Dr. Posey Pronto who wanted me to relay to patient that she has spoken to general surgery and is aware they will only perform a muscle biopsy and not a nerve biopsy. She wants him to proceed with muscle biopsy for now and she will decide later if he needs nerve biopsy.  LMOM for patient/wife to call me back to make them aware.

## 2016-12-12 NOTE — Progress Notes (Signed)
Physical Therapy Session Note  Patient Details  Name: Eduardo Burch MRN: [id number] Date of Birth: 08/06/1940  Today's Date: 12/12/2016 PT Individual Time: 1450-1530 PT Individual Time Calculation (min): 40 min (10 min make up)  Short Term Goals: Week 3:  PT Short Term Goal 1 (Week 3): =LTG due to estimated LOS  Skilled Therapeutic Interventions/Progress Updates: Pt presented in bed agreeable to therapy. Session focused on therex and stretching. Performed manual heel cord and HS stretch 1 min x 3 each bilaterally. Performed HEP including ankle pumps x 20, glute/qs sets with hold x 20, SAQ with hold x 10 bilaterally, heel slides with AA for increased range, modified bridges with bolster due to softness of bed. Provided pt edu on use of adaptive equipment to facilitate stretching and allow AA with heel slides. Pt required intermittent rests due to fatigue. Pt left in bed at end of session with call bell within reach and needs met.      Therapy Documentation Precautions:  Precautions Precautions: Fall Restrictions Weight Bearing Restrictions: No   See Function Navigator for Current Functional Status.   Therapy/Group: Individual Therapy  Shiana Rappleye  Solei Wubben, PTA  12/12/2016, 3:52 PM

## 2016-12-12 NOTE — Progress Notes (Signed)
Physical Therapy Session Note  Patient Details  Name: Eduardo Burch MRN: [id number] Date of Birth: 08-Jun-1940  Today's Date: 12/12/2016 PT Individual Time: 0930-1030 and 1300-1330 PT Individual Time Calculation (min): 60 min and 30 min (total 90 min)   Short Term Goals: Week 3:  PT Short Term Goal 1 (Week 3): =LTG due to estimated LOS  Skilled Therapeutic Interventions/Progress Updates: Tx 1: Pt received seated in w/c, denies pain and agreeable to treatment. Pt reports he "saved my energy for this session so we could walk". W/c propulsion to gym BUE modI. Transfers w/c <>mat table throughout session x 6 with min guard/S depending on fatigue. Sit <>stand maxA from mat table x3 trials, with occasional marching in place. Attempted one sit <>stand with standard walker to provide increased stability, however pt had more difficulty with sit <>stand and declined additional attempts. Gait x4 trials x5-6' each with maxA to stand. Sit <>supine modA on mat table. Performed BLE PNF D2 flexion/extension rhythmic initiation>stabilizing reversals with quick stretch as needed to facilitation activation, specifically in hip flexors/extenders. Pelvic D1 flexion/extension with mild resistance and cueing for technique to reduce compensation with trunk rotation, performed for focus on lumbopelvic dissociation and core activation for carry over into rolling, bed mobility, gait. Returned to w/c with S squat pivot. W/c propulsion to room modI with wife present at end of session.   Tx 2: Pt received seated in bed, denies pain and agreeable to treatment. Supine>sit with S and increased time. Transfer bed>w/c with S. W/c propulsion to/from gym BUE and modI. Transfer w/c >mat x4 during session min guard d/t inability to stand from w/c after gait trials. Table elevated for sit >stand, continues to require maxA. Performed three gait trials x5', x10' and x13' with rollator and min/modA; therapist brought w/c behind and requires urgent  seated rest breaks. Returned to room modI w/c propulsion; remained seated in w/c at end of session, all needs in reach.      Therapy Documentation Precautions:  Precautions Precautions: Fall Restrictions Weight Bearing Restrictions: No   See Function Navigator for Current Functional Status.   Therapy/Group: Individual Therapy  Luberta Mutter 12/12/2016, 10:32 AM

## 2016-12-12 NOTE — Telephone Encounter (Signed)
Left a message at Dr. Charm Barges cell phone

## 2016-12-13 ENCOUNTER — Ambulatory Visit: Payer: Medicare Other | Admitting: Physical Therapy

## 2016-12-13 ENCOUNTER — Inpatient Hospital Stay (HOSPITAL_COMMUNITY): Payer: Medicare Other

## 2016-12-13 ENCOUNTER — Encounter (HOSPITAL_COMMUNITY)
Admission: RE | Disposition: A | Discharge status: Home / Self Care | Payer: Self-pay | Source: Intra-hospital | Attending: Physical Medicine & Rehabilitation

## 2016-12-13 ENCOUNTER — Inpatient Hospital Stay (HOSPITAL_COMMUNITY): Payer: Medicare Other | Admitting: Anesthesiology

## 2016-12-13 ENCOUNTER — Inpatient Hospital Stay (HOSPITAL_COMMUNITY): Payer: Medicare Other | Admitting: Physical Therapy

## 2016-12-13 ENCOUNTER — Inpatient Hospital Stay (HOSPITAL_COMMUNITY): Admission: RE | Admit: 2016-12-13 | Payer: Medicare Other | Source: Ambulatory Visit | Admitting: General Surgery

## 2016-12-13 ENCOUNTER — Encounter (HOSPITAL_COMMUNITY): Payer: Self-pay | Admitting: *Deleted

## 2016-12-13 HISTORY — PX: MUSCLE BIOPSY: SHX716

## 2016-12-13 SURGERY — MUSCLE BIOPSY
Anesthesia: General | Site: Leg Upper | Laterality: Left

## 2016-12-13 MED ORDER — ONDANSETRON HCL 4 MG/2ML IJ SOLN
INTRAMUSCULAR | Status: AC
  Filled 2016-12-13: qty 2

## 2016-12-13 MED ORDER — DOCUSATE SODIUM 100 MG PO CAPS
100.0000 mg | ORAL_CAPSULE | Freq: Two times a day (BID) | ORAL | Status: DC
  Administered 2016-12-13: 100 mg via ORAL
  Filled 2016-12-13 (×4): qty 1

## 2016-12-13 MED ORDER — BUPIVACAINE HCL (PF) 0.25 % IJ SOLN
INTRAMUSCULAR | Status: AC
  Filled 2016-12-13: qty 30

## 2016-12-13 MED ORDER — 0.9 % SODIUM CHLORIDE (POUR BTL) OPTIME
TOPICAL | Status: DC | PRN
  Administered 2016-12-13: 1000 mL

## 2016-12-13 MED ORDER — DEXTROSE 5 % IV SOLN
INTRAVENOUS | Status: DC | PRN
  Administered 2016-12-13: 50 ug/min via INTRAVENOUS

## 2016-12-13 MED ORDER — CEFAZOLIN SODIUM-DEXTROSE 2-3 GM-% IV SOLR
INTRAVENOUS | Status: DC | PRN
  Administered 2016-12-13: 2 g via INTRAVENOUS

## 2016-12-13 MED ORDER — PHENYLEPHRINE 40 MCG/ML (10ML) SYRINGE FOR IV PUSH (FOR BLOOD PRESSURE SUPPORT)
PREFILLED_SYRINGE | INTRAVENOUS | Status: DC | PRN
  Administered 2016-12-13: 40 ug via INTRAVENOUS
  Administered 2016-12-13: 80 ug via INTRAVENOUS
  Administered 2016-12-13: 120 ug via INTRAVENOUS
  Administered 2016-12-13: 40 ug via INTRAVENOUS
  Administered 2016-12-13: 120 ug via INTRAVENOUS

## 2016-12-13 MED ORDER — ACETAMINOPHEN 500 MG PO TABS: 500.0000 mg | ORAL_TABLET | Freq: Four times a day (QID) | ORAL | Status: DC | PRN

## 2016-12-13 MED ORDER — LIDOCAINE HCL 1 % IJ SOLN
INTRAMUSCULAR | Status: AC
Start: 2016-12-13 — End: 2016-12-13
  Filled 2016-12-13: qty 20

## 2016-12-13 MED ORDER — BUPIVACAINE HCL (PF) 0.25 % IJ SOLN
INTRAMUSCULAR | Status: DC | PRN
  Administered 2016-12-13: 10 mL

## 2016-12-13 MED ORDER — FENTANYL CITRATE (PF) 100 MCG/2ML IJ SOLN
INTRAMUSCULAR | Status: DC | PRN
  Administered 2016-12-13 (×2): 25 ug via INTRAVENOUS

## 2016-12-13 MED ORDER — FENTANYL CITRATE (PF) 100 MCG/2ML IJ SOLN: 25.0000 ug | INTRAMUSCULAR | Status: DC | PRN

## 2016-12-13 MED ORDER — ASPIRIN 81 MG PO CHEW: 81.0000 mg | CHEWABLE_TABLET | Freq: Every day | ORAL | Status: DC

## 2016-12-13 MED ORDER — BISACODYL 5 MG PO TBEC: 5.0000 mg | DELAYED_RELEASE_TABLET | Freq: Every day | ORAL | Status: DC | PRN

## 2016-12-13 MED ORDER — PROPOFOL 10 MG/ML IV BOLUS
INTRAVENOUS | Status: DC | PRN
  Administered 2016-12-13: 30 mg via INTRAVENOUS
  Administered 2016-12-13: 140 mg via INTRAVENOUS

## 2016-12-13 MED ORDER — FENTANYL CITRATE (PF) 250 MCG/5ML IJ SOLN
INTRAMUSCULAR | Status: AC
  Filled 2016-12-13: qty 5

## 2016-12-13 MED ORDER — EPHEDRINE SULFATE-NACL 50-0.9 MG/10ML-% IV SOSY
PREFILLED_SYRINGE | INTRAVENOUS | Status: DC | PRN
  Administered 2016-12-13: 10 mg via INTRAVENOUS
  Administered 2016-12-13 (×2): 5 mg via INTRAVENOUS

## 2016-12-13 MED ORDER — METOPROLOL SUCCINATE ER 25 MG PO TB24: 25.0000 mg | ORAL_TABLET | Freq: Every day | ORAL | Status: DC

## 2016-12-13 MED ORDER — LIDOCAINE 2% (20 MG/ML) 5 ML SYRINGE
INTRAMUSCULAR | Status: AC
  Filled 2016-12-13: qty 5

## 2016-12-13 MED ORDER — PROPOFOL 10 MG/ML IV BOLUS
INTRAVENOUS | Status: AC
  Filled 2016-12-13: qty 20

## 2016-12-13 MED ORDER — LACTATED RINGERS IV SOLN
INTRAVENOUS | Status: DC
  Administered 2016-12-13: 09:00:00 via INTRAVENOUS

## 2016-12-13 MED ORDER — LACTATED RINGERS IV SOLN
INTRAVENOUS | Status: DC | PRN
  Administered 2016-12-13: 09:00:00 via INTRAVENOUS

## 2016-12-13 MED ORDER — TIZANIDINE HCL 2 MG PO TABS
2.0000 mg | ORAL_TABLET | Freq: Three times a day (TID) | ORAL | Status: DC
  Administered 2016-12-13 – 2016-12-15 (×6): 2 mg via ORAL
  Filled 2016-12-13 (×6): qty 1

## 2016-12-13 MED ORDER — TRAMADOL HCL 50 MG PO TABS: 50.0000 mg | ORAL_TABLET | Freq: Four times a day (QID) | ORAL | Status: DC | PRN

## 2016-12-13 MED ORDER — PHENYLEPHRINE 40 MCG/ML (10ML) SYRINGE FOR IV PUSH (FOR BLOOD PRESSURE SUPPORT)
PREFILLED_SYRINGE | INTRAVENOUS | Status: AC
  Filled 2016-12-13: qty 10

## 2016-12-13 MED ORDER — EPHEDRINE 5 MG/ML INJ
INTRAVENOUS | Status: AC
  Filled 2016-12-13: qty 10

## 2016-12-13 MED ORDER — ONDANSETRON HCL 4 MG/2ML IJ SOLN
INTRAMUSCULAR | Status: DC | PRN
  Administered 2016-12-13: 4 mg via INTRAVENOUS

## 2016-12-13 MED ORDER — ONDANSETRON HCL 4 MG/2ML IJ SOLN: 4.0000 mg | Freq: Once | INTRAMUSCULAR | Status: DC | PRN

## 2016-12-13 MED ORDER — GABAPENTIN 300 MG PO CAPS
300.0000 mg | ORAL_CAPSULE | Freq: Every day | ORAL | Status: DC
  Administered 2016-12-13 – 2016-12-14 (×2): 300 mg via ORAL
  Filled 2016-12-13 (×2): qty 1

## 2016-12-13 MED ORDER — LIDOCAINE HCL (CARDIAC) 20 MG/ML IV SOLN: INTRAVENOUS | Status: DC | PRN

## 2016-12-13 MED ORDER — LIDOCAINE 2% (20 MG/ML) 5 ML SYRINGE
INTRAMUSCULAR | Status: DC | PRN
  Administered 2016-12-13: 60 mg via INTRAVENOUS

## 2016-12-13 MED ORDER — LISINOPRIL 10 MG PO TABS
10.0000 mg | ORAL_TABLET | Freq: Every day | ORAL | Status: DC
  Administered 2016-12-13 – 2016-12-15 (×3): 10 mg via ORAL
  Filled 2016-12-13 (×3): qty 1

## 2016-12-13 SURGICAL SUPPLY — 44 items
ADH SKN CLS APL DERMABOND .7 (GAUZE/BANDAGES/DRESSINGS) ×1
BLADE SURG 15 STRL LF DISP TIS (BLADE) ×1 IMPLANT
BLADE SURG 15 STRL SS (BLADE) ×3
CHLORAPREP W/TINT 10.5 ML (MISCELLANEOUS) ×3 IMPLANT
CONT SPEC 4OZ CLIKSEAL STRL BL (MISCELLANEOUS) ×3 IMPLANT
COVER SURGICAL LIGHT HANDLE (MISCELLANEOUS) ×3 IMPLANT
DECANTER SPIKE VIAL GLASS SM (MISCELLANEOUS) ×3 IMPLANT
DERMABOND ADVANCED (GAUZE/BANDAGES/DRESSINGS) ×2
DERMABOND ADVANCED .7 DNX12 (GAUZE/BANDAGES/DRESSINGS) ×1 IMPLANT
DRAPE LAPAROTOMY 100X72 PEDS (DRAPES) ×3 IMPLANT
DRAPE UTILITY XL STRL (DRAPES) ×3 IMPLANT
DRSG TELFA 3X8 NADH (GAUZE/BANDAGES/DRESSINGS) ×3 IMPLANT
ELECT CAUTERY BLADE 6.4 (BLADE) ×3 IMPLANT
ELECT REM PT RETURN 9FT ADLT (ELECTROSURGICAL) ×3
ELECTRODE REM PT RTRN 9FT ADLT (ELECTROSURGICAL) ×1 IMPLANT
GAUZE SPONGE 4X4 16PLY XRAY LF (GAUZE/BANDAGES/DRESSINGS) ×3 IMPLANT
GLOVE BIO SURGEON STRL SZ7.5 (GLOVE) ×3 IMPLANT
GLOVE BIOGEL PI IND STRL 6.5 (GLOVE) IMPLANT
GLOVE BIOGEL PI IND STRL 8 (GLOVE) ×1 IMPLANT
GLOVE BIOGEL PI INDICATOR 6.5 (GLOVE) ×2
GLOVE BIOGEL PI INDICATOR 8 (GLOVE) ×2
GLOVE ECLIPSE 6.5 STRL STRAW (GLOVE) ×2 IMPLANT
GOWN STRL REUS W/ TWL LRG LVL3 (GOWN DISPOSABLE) ×1 IMPLANT
GOWN STRL REUS W/ TWL XL LVL3 (GOWN DISPOSABLE) ×1 IMPLANT
GOWN STRL REUS W/TWL LRG LVL3 (GOWN DISPOSABLE) ×3
GOWN STRL REUS W/TWL XL LVL3 (GOWN DISPOSABLE) ×3
KIT BASIN OR (CUSTOM PROCEDURE TRAY) ×3 IMPLANT
KIT ROOM TURNOVER OR (KITS) ×3 IMPLANT
NDL HYPO 25GX1X1/2 BEV (NEEDLE) ×1 IMPLANT
NEEDLE HYPO 25GX1X1/2 BEV (NEEDLE) ×3 IMPLANT
NS IRRIG 1000ML POUR BTL (IV SOLUTION) ×3 IMPLANT
PACK SURGICAL SETUP 50X90 (CUSTOM PROCEDURE TRAY) ×3 IMPLANT
PAD ARMBOARD 7.5X6 YLW CONV (MISCELLANEOUS) ×3 IMPLANT
PAD DRESSING TELFA 3X8 NADH (GAUZE/BANDAGES/DRESSINGS) ×1 IMPLANT
PENCIL BUTTON HOLSTER BLD 10FT (ELECTRODE) ×3 IMPLANT
SUT MNCRL AB 4-0 PS2 18 (SUTURE) ×3 IMPLANT
SUT SILK 2 0 (SUTURE) ×3
SUT SILK 2-0 18XBRD TIE 12 (SUTURE) ×1 IMPLANT
SUT VIC AB 3-0 SH 27 (SUTURE) ×6
SUT VIC AB 3-0 SH 27X BRD (SUTURE) ×2 IMPLANT
SYR BULB 3OZ (MISCELLANEOUS) ×3 IMPLANT
SYR CONTROL 10ML LL (SYRINGE) ×3 IMPLANT
TOWEL OR 17X24 6PK STRL BLUE (TOWEL DISPOSABLE) ×3 IMPLANT
TOWEL OR 17X26 10 PK STRL BLUE (TOWEL DISPOSABLE) ×1 IMPLANT

## 2016-12-13 NOTE — Progress Notes (Signed)
Social Work Patient ID: Eduardo Burch, male   DOB: 03-Apr-1940, 76 y.o.   MRN: [id number]   Have reviewed conference with pt and he reports he is ready for d/c on Saturday "no matter what!"  Muscle biopsy this morning and pt reports it went well and is aware it will take a couple of week for results.  He is pleased that we have gotten him reestablished at [facility] beginning next week.  Continue to follow.  [name], LCSW

## 2016-12-13 NOTE — Telephone Encounter (Signed)
Patient and wife never called back. FYI.

## 2016-12-13 NOTE — Progress Notes (Signed)
Occupational Therapy Note  Patient Details  Name: Leng Montesdeoca MRN: [id number] Date of Birth: 15-Aug-1940  Today's Date: 12/13/2016 OT Missed Time: 87 Minutes Missed Time Reason: Unavailable (comment) (off unit for procedure)  Pt missed 60 mins skilled OT services.  Pt off unit for muscle biopsy.   Leotis Shames [facility] 12/13/2016, 11:32 AM

## 2016-12-13 NOTE — Progress Notes (Signed)
[facility] PHYSICAL MEDICINE & REHABILITATION     PROGRESS NOTE    Subjective/Complaints:  Pt feels that he had a better day yesterday. Had not yet gone for muscle biopsy when I saw him this morning. Said he walked in gym, although I don't see record of that. Neck feeling better.   ROS: pt denies nausea, vomiting, diarrhea, cough, shortness of breath or chest pain     Objective: Vital Signs: Blood pressure (!) 156/75, pulse 73, temperature 97.8 F (36.6 C), temperature source Oral, resp. rate 18, height 6' (1.829 m), weight 75.3 kg (166 lb), SpO2 100 %. No results found. No results for input(s): WBC, HGB, HCT, PLT in the last 72 hours. No results for input(s): NA, K, CL, GLUCOSE, BUN, CREATININE, CALCIUM in the last 72 hours.  Invalid input(s): CO CBG (last 3)  No results for input(s): GLUCAP in the last 72 hours.  Wt Readings from Last 3 Encounters:  12/13/16 75.3 kg (166 lb)  11/22/16 67.6 kg (149 lb 0.5 oz)  10/12/16 74.8 kg (164 lb 12.8 oz)    Physical Exam:  HENT:  Head: Normocephalicand atraumatic.  Eyes: EOMare normal. Left eye exhibits no discharge.  Neck: Normal range of motion. Neck supple.  Cards: RRR without murmur. No JVD            Respiratory:  CTA Bilaterally without wheezes or rales. Normal effort    Musculoskeletal: tenderness along right lower trap/levator scap Psychiatric: coopeartive Skin. Warm and dry Neurological:  Pt alert and oriented. Cognitively appropriate  RUE 4+ to 5/5prox to distal, LUE  4+ to 5/5. RLE: 2+HF,  3- KE and 4-/5 ADF/PF---motor exam unchanged  There are NO sensory findings in his lower extremities. Marland Kitchen LLE 2 to 2+/5 HF, 3- KE and 3+/5 ADF/PF-stable exam.  DTR's tr to absent  Assessment/Plan: 1. 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 Physiatrist is providing close team supervision and 24 hour management of active medical  problems listed below. Physiatrist and rehab team continue to assess barriers to discharge/monitor patient progress toward functional and medical goals.  Function:  Bathing Bathing position   Position: Wheelchair/chair at sink  Bathing parts Body parts bathed by patient: Right arm, Left arm, Chest, Abdomen, Front perineal area, Right upper leg, Left upper leg, Buttocks Body parts bathed by helper: Right lower leg, Left lower leg, Back  Bathing assist        Upper Body Dressing/Undressing Upper body dressing   What is the patient wearing?: Pull over shirt/dress     Pull over shirt/dress - Perfomed by patient: Thread/unthread right sleeve, Thread/unthread left sleeve, Put head through opening, Pull shirt over trunk          Upper body assist Assist Level: Supervision or verbal cues      Lower Body Dressing/Undressing Lower body dressing   What is the patient wearing?: Pants, Non-skid slipper socks, Underwear Underwear - Performed by patient: Thread/unthread right underwear leg, Thread/unthread left underwear leg Underwear - Performed by helper: Pull underwear up/down Pants- Performed by patient: Thread/unthread right pants leg Pants- Performed by helper: Pull pants up/down, Thread/unthread left pants leg   Non-skid slipper socks- Performed by helper: Don/doff right sock, Don/doff left sock       Shoes - Performed by helper: Don/doff right shoe, Don/doff left shoe       TED Hose - Performed by helper: Don/doff right TED hose, Don/doff left TED  hose  Lower body assist Assist for lower body dressing: Touching or steadying assistance (Pt > 75%)      Toileting Toileting   Toileting steps completed by patient: Adjust clothing prior to toileting, Performs perineal hygiene Toileting steps completed by helper: Adjust clothing prior to toileting, Performs perineal hygiene, Adjust clothing after toileting Toileting Assistive Devices: Grab bar or rail  Toileting assist Assist  level: Two helpers   Transfers Chair/bed transfer   Chair/bed transfer method: Lateral scoot Chair/bed transfer assist level: Supervision or verbal cues Chair/bed transfer assistive device: Armrests Mechanical lift: Ecologist     Max distance: 13 Assist level: Moderate assist (Pt 50 - 74%)   Wheelchair   Type: Manual Max wheelchair distance: 150 ft Assist Level: No help, No cues, assistive device, takes more than reasonable amount of time  Cognition Comprehension Comprehension assist level: Follows complex conversation/direction with extra time/assistive device  Expression Expression assist level: Expresses complex ideas: With extra time/assistive device  Social Interaction Social Interaction assist level: Interacts appropriately with others with medication or extra time (anti-anxiety, antidepressant).  Problem Solving Problem solving assist level: Solves basic 90% of the time/requires cueing < 10% of the time  Memory Memory assist level: Recognizes or recalls 90% of the time/requires cueing < 10% of the time   Medical Problem List and Plan: 1. 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 -strength/stamina had shown some gradual improvement until the end of the week. However team noticing worsening exercise tolerance   -exam and prior work up not really consistent with CIDP  -I have spoken to Dr. Posey Pronto and the patient at length regarding considerations  -    ACh ab testing still pending.  -Muscle bx performed today. Appreciate surgical assistance.   -also displays  Parkinson-like symptoms/orthostasis   -still working toward Saturday discharge 2. DVT Prophylaxis/Anticoagulation: Subcutaneous Lovenox. Reiterated the need for lovenox until mobility improves.  3. Pain Management: Ultram 50 mg every 6 hours as needed,   -kpad/k-tap to right  neck---myofascial pain  -add prn flexeril 4. Mood: Provide emotional support  -scheduled trazodone at night effective 5. Neuropsych: This patient iscapable of making decisions on hisown behalf. 6. Skin/Wound Care:  Continue nutrition/local measures 7. Fluids/Electrolytes/Nutrition:   --continue-107meq daily  -potassium 4.4 on 10/4 8.Hypertension/hypotension: Lisinopril 10 mg daily, Toprol 25 mg daily.   - toleate higher resting/am pressures  -KHT,  abdominal binder too as needed during day  -encouraging fluids    9.History of prostate cancer status post prostatectomy. CT chest abdomen and pelvis showed no findings typical for metastatic prostate cancer  -he has gained a modest amount of weight so far here although albumin is still low.  10.Constipation. Moving bowels regularly now    LOS (Days) 20 A FACE TO FACE EVALUATION WAS PERFORMED  Meredith Staggers, MD 12/13/2016 11:56 AM

## 2016-12-13 NOTE — Op Note (Signed)
12/13/2016  9:46 AM  PATIENT:  Eduardo Burch  76 y.o. male  PRE-OPERATIVE DIAGNOSIS:  Progressive bilateral lower extremity weakness  POST-OPERATIVE DIAGNOSIS:  Progressive bilateral lower extremity weakness  PROCEDURE:  Procedure(s): LEFT VASTUS LATERAILS MUSCLE BIOPSY (Left)  SURGEON:  Surgeon(s) and Role:    Ralene Ok, MD - Primary  ANESTHESIA:   local and general  EBL: <5cc  BLOOD ADMINISTERED:none  DRAINS: none   LOCAL MEDICATIONS USED:  BUPIVICAINE   SPECIMEN:  Source of Specimen:  2x2cm muscle biopsy  DISPOSITION OF SPECIMEN:  PATHOLOGY  COUNTS:  YES  TOURNIQUET:  * No tourniquets in log *  DICTATION: .Dragon Dictation The patient was taken to the OR and placed in the supine position with bilateral SCDs in place. The patient was prepped and draped in the usual sterile fashion. A time out was called and all facts were verified.   A 3cm incision was made over his L quadriceps muscle. Sharp dissection was taken down to his fascia. This was incised. A measrued muscle of 2x 2cm was Isolated with hemostats. This was sharply excised. Each cut end was tied off with 2-0 silk ties. The specimen was sent to pathology.   I checked for hemostasis and it was excellent. 3-0 vicryl were used to reapproximate the deep dermal layer. 4-0 monocryls were used to reapproximate the skin in a subcuticular fashion. Dermabond was used as a dressing.   The patient tolerated the procedure well. He was taken to the recovery room in stable condition.    PLAN OF CARE: Admit to inpatient   PATIENT DISPOSITION:  PACU - hemodynamically stable.   Delay start of Pharmacological VTE agent (>24hrs) due to surgical blood loss or risk of bleeding: no

## 2016-12-13 NOTE — Anesthesia Procedure Notes (Addendum)
Procedure Name: LMA Insertion Date/Time: 12/13/2016 9:18 AM Performed by: Orlie Dakin Pre-anesthesia Checklist: Patient identified, Emergency Drugs available, Suction available, Patient being monitored and Timeout performed Patient Re-evaluated:Patient Re-evaluated prior to induction Oxygen Delivery Method: Circle system utilized Preoxygenation: Pre-oxygenation with 100% oxygen Induction Type: IV induction Ventilation: Mask ventilation without difficulty LMA: LMA inserted LMA Size: 4.0 Number of attempts: 2 Placement Confirmation: positive ETCO2 and breath sounds checked- equal and bilateral Tube secured with: Tape Dental Injury: Teeth and Oropharynx as per pre-operative assessment  Comments: LMA 3 placed, large leak, then LMA 4 +EtCO2 and no leak noted.

## 2016-12-13 NOTE — Anesthesia Postprocedure Evaluation (Signed)
Anesthesia Post Note  Patient: Eduardo Burch  Procedure(s) Performed: LEFT VASTUS LATERAILS MUSCLE BIOPSY (Left Leg Upper)     Patient location during evaluation: PACU Anesthesia Type: General Level of consciousness: awake and alert Pain management: pain level controlled Vital Signs Assessment: post-procedure vital signs reviewed and stable Respiratory status: spontaneous breathing, nonlabored ventilation and respiratory function stable Cardiovascular status: blood pressure returned to baseline and stable Postop Assessment: no apparent nausea or vomiting Anesthetic complications: no    Last Vitals:  Vitals:   12/13/16 1019 12/13/16 1029  BP: 136/86 (!) 144/83  Pulse: 85 85  Resp: 18 18  Temp:  36.6 C  SpO2: 100% 100%    Last Pain:  Vitals:   12/13/16 1029  TempSrc:   PainSc: 0-No pain                 Audry Pili

## 2016-12-13 NOTE — Progress Notes (Signed)
Patient received from PACU at 1043 alert and oriented no complaint of pain. Left leg incision with skin glue CD&I. Patient asking for food. Will start on clear liquid and advance per MD order if tolerating liquids. Continue with plan of care.  Eduardo Burch

## 2016-12-13 NOTE — Progress Notes (Signed)
Occupational Therapy Note  Patient Details  Name: Eduardo Burch MRN: [id number] Date of Birth: 11/19/1940  Today's Date: 12/13/2016 OT Individual Time: 1400-1425 OT Individual Time Calculation (min): 25 min   Pt denies pain but c/o increased fatigue 2/2 procedure early in morning Individual Therapy  Pt resting in w/c upon arrival.  Pt stated he was fatigued and requested to return to bed.  Pt required min A for slide board transfer.  Reviewed BUE exercises and pt return demonstrated with min verbal cues.  Pt remained in bed with all needs within reach.    Leotis Shames [facility] 12/13/2016, 2:36 PM

## 2016-12-13 NOTE — Transfer of Care (Signed)
Immediate Anesthesia Transfer of Care Note  Patient: Eduardo Burch  Procedure(s) Performed: LEFT VASTUS LATERAILS MUSCLE BIOPSY (Left Leg Upper)  Patient Location: PACU  Anesthesia Type:General  Level of Consciousness: unresponsive and drowsy  Airway & Oxygen Therapy: Patient Spontanous Breathing and Patient connected to face mask oxygen  Post-op Assessment: Report given to RN and Post -op Vital signs reviewed and stable  Post vital signs: Reviewed and stable  Last Vitals:  Vitals:   12/13/16 0827 12/13/16 0951  BP: (!) 177/82 (P) 93/65  Pulse: 89 (P) 89  Resp: 18 (P) 16  Temp: 36.6 C (P) 36.5 C  SpO2: 100% (P) 100%    Last Pain:  Vitals:   12/13/16 0951  TempSrc:   PainSc: (P) Asleep      Patients Stated Pain Goal: 2 (95/18/84 1660)  Complications: No apparent anesthesia complications

## 2016-12-13 NOTE — Progress Notes (Signed)
Physical Therapy Session Note  Patient Details  Name: Eduardo Burch MRN: [id number] Date of Birth: Jul 13, 1940  Today's Date: 12/13/2016 PT Individual Time: 1300-1330 PT Individual Time Calculation (min): 30 min   Short Term Goals: Week 3:  PT Short Term Goal 1 (Week 3): =LTG due to estimated LOS  Skilled Therapeutic Interventions/Progress Updates: During scheduled AM session pt had recently returned from procedure; no order for bedrest however pt and RN both advised resting at this time to recover. Therapist able to reschedule session with patient however unable to complete 60 min as scheduled d/t conflicting with other scheduled sessions; pt missed 30 min PT.   Pt received seated in w/c, denies pain and agreeable to treatment. W/c propulsion modI with BUEs for strengthening and endurance. Transfer w/c<>mat table min guard squat pivot. Sit>Stand x1 trial from elevated mat table with maxA; pt unable to tolerate standing d/t new IV in L wrist and significant pain with wrist extension.  Sit <>supine modA. Performed BLE PNF D2 flexion/extension rhythmic initiation>stabilizing reversals with quick stretch to assist with initiation. Performed 3 sets 10-12 reps per side before fatigued. Returned to room modI w/c propulsion. Remained seated in w/c at end of session, all needs in reach.       Therapy Documentation Precautions:  Precautions Precautions: Fall Restrictions Weight Bearing Restrictions: No General: PT Amount of Missed Time (min): 30 Minutes PT Missed Treatment Reason: Other (Comment) (resting s/p procedure) Pain: Pain Assessment Pain Assessment: 0-10 Pain Score: 0-No pain   See Function Navigator for Current Functional Status.   Therapy/Group: Individual Therapy  Luberta Mutter 12/13/2016, 1:55 PM

## 2016-12-13 NOTE — Telephone Encounter (Signed)
Have discussed with Dr. Naaman Plummer, suggested superficial peroneal nerve with  Muscle biopsy at same procedure

## 2016-12-13 NOTE — Progress Notes (Signed)
Patient restful throughout shift, easily aroused, cooperative , respiration unlabored on room air, coloration adequate, medicated at 2214 with Tylenol 650mg  po for back pain., pain appear to be control with no further c/o.Marland Kitchen Plan OR surgical procedure today  NPO afte MN, states he comprehends surgical procedure, IV Ancef on call, notified pharmacy for medication. Call bell within reach Safety maintained. willl continue to monitor and assist,.

## 2016-12-13 NOTE — Anesthesia Preprocedure Evaluation (Addendum)
Anesthesia Evaluation  Patient identified by MRN, date of birth, ID band Patient awake    Reviewed: Allergy & Precautions, NPO status , Patient's Chart, lab work & pertinent test results  Airway Mallampati: III  TM Distance: >3 FB Neck ROM: Full    Dental  (+) Dental Advisory Given, Caps, Teeth Intact   Pulmonary former smoker,    Pulmonary exam normal breath sounds clear to auscultation       Cardiovascular hypertension, Normal cardiovascular exam+ Valvular Problems/Murmurs AI  Rhythm:Regular Rate:Normal  TTE 08/2016 - Moderate LVH. Ejection fraction was in the range of 55% to 60%. Grade 1 diastolic dysfunction. 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  Reproductive/Obstetrics                           Anesthesia Physical Anesthesia Plan  ASA: III  Anesthesia Plan: General   Post-op Pain Management:    Induction: Intravenous  PONV Risk Score and Plan: 3 and Ondansetron, Propofol infusion and Treatment may vary due to age or medical condition  Airway Management Planned: LMA  Additional Equipment: None  Intra-op Plan:   Post-operative Plan: Extubation in OR  Informed Consent: I have reviewed the patients History and Physical, chart, labs and discussed the procedure including the risks, benefits and alternatives for the proposed anesthesia with the patient or authorized representative who has indicated his/her understanding and acceptance.   Dental advisory given  Plan Discussed with: CRNA  Anesthesia Plan Comments:          Anesthesia Quick Evaluation

## 2016-12-13 NOTE — Telephone Encounter (Signed)
Noted  

## 2016-12-14 ENCOUNTER — Inpatient Hospital Stay (HOSPITAL_COMMUNITY): Payer: Medicare Other

## 2016-12-14 ENCOUNTER — Inpatient Hospital Stay (HOSPITAL_COMMUNITY): Payer: Medicare Other | Admitting: Physical Therapy

## 2016-12-14 ENCOUNTER — Encounter (HOSPITAL_COMMUNITY): Payer: Self-pay | Admitting: General Surgery

## 2016-12-14 MED ORDER — LISINOPRIL 10 MG PO TABS: 10.0000 mg | ORAL_TABLET | Freq: Every day | ORAL | 3 refills | Status: DC

## 2016-12-14 MED ORDER — SENNOSIDES-DOCUSATE SODIUM 8.6-50 MG PO TABS
2.0000 | ORAL_TABLET | Freq: Two times a day (BID) | ORAL | Status: DC
Start: 2016-12-14 — End: 2016-12-21

## 2016-12-14 MED ORDER — GABAPENTIN 300 MG PO CAPS: 300.0000 mg | ORAL_CAPSULE | Freq: Every day | ORAL | 0 refills | Status: AC

## 2016-12-14 MED ORDER — TIZANIDINE HCL 2 MG PO TABS: 2.0000 mg | ORAL_TABLET | Freq: Three times a day (TID) | ORAL | 5 refills | Status: AC

## 2016-12-14 MED ORDER — TRAMADOL HCL 50 MG PO TABS: 50.0000 mg | ORAL_TABLET | Freq: Four times a day (QID) | ORAL | 0 refills | Status: AC | PRN

## 2016-12-14 MED ORDER — CYCLOBENZAPRINE HCL 5 MG PO TABS: 5.0000 mg | ORAL_TABLET | Freq: Three times a day (TID) | ORAL | 0 refills | Status: AC | PRN

## 2016-12-14 MED ORDER — METOPROLOL TARTRATE 25 MG PO TABS: 12.5000 mg | ORAL_TABLET | Freq: Two times a day (BID) | ORAL | 0 refills | Status: DC

## 2016-12-14 MED ORDER — TRAZODONE HCL 50 MG PO TABS: 50.0000 mg | ORAL_TABLET | Freq: Every day | ORAL | 0 refills | Status: AC

## 2016-12-14 NOTE — Progress Notes (Signed)
[place] PHYSICAL MEDICINE & REHABILITATION     PROGRESS NOTE    Subjective/Complaints:  Feeling well today. Excited to get back to therapies again. Left thigh a little sore but feeling much better this morning.   ROS: pt denies nausea, vomiting, diarrhea, cough, shortness of breath or chest pain    Objective: Vital Signs: Blood pressure (!) 150/83, pulse 83, temperature 97.9 F (36.6 C), temperature source Oral, resp. rate 18, height 6' (1.829 m), weight 75.3 kg (166 lb), SpO2 98 %. No results found. No results for input(s): WBC, HGB, HCT, PLT in the last 72 hours. No results for input(s): NA, K, CL, GLUCOSE, BUN, CREATININE, CALCIUM in the last 72 hours.  Invalid input(s): CO CBG (last 3)  No results for input(s): GLUCAP in the last 72 hours.  Wt Readings from Last 3 Encounters:  12/13/16 75.3 kg (166 lb)  11/22/16 67.6 kg (149 lb 0.5 oz)  10/12/16 74.8 kg (164 lb 12.8 oz)    Physical Exam:  HENT:  Head: Normocephalicand atraumatic.  Eyes: EOMare normal. Left eye exhibits no discharge.  Neck: Normal range of motion. Neck supple.  Cards: RRR without murmur. No JVD             Respiratory:  CTA Bilaterally without wheezes or rales. Normal effort     Musculoskeletal: tenderness along right lower trap/levator scap Psychiatric: coopeartive Skin. Left thigh incision with bruising Neurological:  Pt alert and oriented. Cognitively appropriate  RUE 4+ to 5/5prox to distal, LUE  4+ to 5/5. RLE: 2+HF,  3- KE and 4-/5 ADF/PF---motor exam unchanged  There are NO sensory findings in his lower extremities. Marland Kitchen LLE 2 to 2+/5 HF, 3- KE and 3+/5 ADF/PF-nochanges.  DTR's tr to absent  Assessment/Plan: 1. 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 Physiatrist is providing close team supervision and 24 hour management of active medical problems listed below. Physiatrist and rehab team  continue to assess barriers to discharge/monitor patient progress toward functional and medical goals.  Function:  Bathing Bathing position   Position: Wheelchair/chair at sink  Bathing parts Body parts bathed by patient: Right arm, Left arm, Chest, Abdomen, Front perineal area, Right upper leg, Left upper leg, Buttocks Body parts bathed by helper: Right lower leg, Left lower leg, Back  Bathing assist        Upper Body Dressing/Undressing Upper body dressing   What is the patient wearing?: Pull over shirt/dress     Pull over shirt/dress - Perfomed by patient: Thread/unthread right sleeve, Thread/unthread left sleeve, Put head through opening, Pull shirt over trunk          Upper body assist Assist Level: Supervision or verbal cues      Lower Body Dressing/Undressing Lower body dressing   What is the patient wearing?: Pants, Non-skid slipper socks, Underwear Underwear - Performed by patient: Thread/unthread right underwear leg, Thread/unthread left underwear leg Underwear - Performed by helper: Pull underwear up/down Pants- Performed by patient: Thread/unthread right pants leg Pants- Performed by helper: Pull pants up/down, Thread/unthread left pants leg   Non-skid slipper socks- Performed by helper: Don/doff right sock, Don/doff left sock       Shoes - Performed by helper: Don/doff right shoe, Don/doff left shoe       TED Hose - Performed by helper: Don/doff right TED hose, Don/doff left TED hose  Lower body assist Assist for lower body dressing: Touching or steadying assistance (  Pt > 75%)      Toileting Toileting   Toileting steps completed by patient: Adjust clothing prior to toileting, Performs perineal hygiene Toileting steps completed by helper: Adjust clothing prior to toileting, Performs perineal hygiene, Adjust clothing after toileting Toileting Assistive Devices: Grab bar or rail  Toileting assist Assist level: Two helpers   Transfers Chair/bed transfer    Chair/bed transfer method: Lateral scoot Chair/bed transfer assist level: Supervision or verbal cues Chair/bed transfer assistive device: Armrests Mechanical lift: Ecologist     Max distance: 13 Assist level: Moderate assist (Pt 50 - 74%)   Wheelchair   Type: Manual Max wheelchair distance: 150 ft Assist Level: No help, No cues, assistive device, takes more than reasonable amount of time  Cognition Comprehension Comprehension assist level: Follows complex conversation/direction with extra time/assistive device  Expression Expression assist level: Expresses complex ideas: With extra time/assistive device  Social Interaction Social Interaction assist level: Interacts appropriately with others with medication or extra time (anti-anxiety, antidepressant).  Problem Solving Problem solving assist level: Solves basic 90% of the time/requires cueing < 10% of the time  Memory Memory assist level: Recognizes or recalls 90% of the time/requires cueing < 10% of the time   Medical Problem List and Plan: 1. 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 -strength/stamina had shown some gradual improvement until the end of last week. Now with decreased stamina, limited gait   -exam and prior work up not really consistent with CIDP  -I have spoken to Dr. Posey Pronto and the patient at length regarding considerations  -  ACh ab testing remains pending.  -Muscle bx performed today. Appreciate surgical assistance.  -nerve bx as outpt potentially. Will need repeat neurodiagnostic testing also   -also displays  Parkinson-like symptoms/orthostasis   -still working toward Saturday discharge  -follow up with me in office in 2-4 weeks.   -neuro follow up per Dr. Posey Pronto.  2. DVT Prophylaxis/Anticoagulation: Subcutaneous Lovenox. Reiterated the need for lovenox until mobility improves.   3. Pain Management: Ultram 50 mg every 6 hours as needed,   -kpad/k-tap to right neck---myofascial pain  -add prn flexeril 4. Mood: Provide emotional support  -scheduled trazodone at night effective 5. Neuropsych: This patient iscapable of making decisions on hisown behalf. 6. Skin/Wound Care:  Continue nutrition/local measures 7. Fluids/Electrolytes/Nutrition:   --continue-43meq daily  -potassium stable 8.Hypertension/hypotension: Lisinopril 10 mg daily, Toprol 25 mg daily.   - tolerate higher resting/am pressures  -KHT,  abdominal binder too as needed during day  -generally better    9.History of prostate cancer status post prostatectomy. CT chest abdomen and pelvis showed no findings typical for metastatic prostate cancer  -he has gained a modest amount of weight so far here although albumin is still low.  10.Constipation. Moving bowels regularly now    LOS (Days) 21 A FACE TO FACE EVALUATION WAS PERFORMED  Alger Simons T, MD 12/14/2016 9:10 AM

## 2016-12-14 NOTE — Progress Notes (Signed)
Occupational Therapy Discharge Summary  Patient Details  Name: Eduardo Burch MRN: [id number] Date of Birth: 22-Aug-1940  Patient has met 9 of 10 long term goals due to improved activity tolerance, improved balance, postural control and ability to compensate for deficits.  Pt made steady progress with BADLs during this admission.  Pt completes LB dressing tasks at bed level with lateral leans and toileting tasks with lateral leans.  Pt BLE fatigue quickly with activity and sit<>stand from 20" surface requires mod A/tot A.  Pt completes scoot transfers and slide board transfers with min A/supervision (depending on fatigue).  Pt has been issued a BUE HEP.  Pt's wife has been present and provided appropriate level of supervision/assistance with BADLs. Patient to discharge at [facility] Assist level.  Patient's care partner is independent to provide the necessary physical assistance at discharge.    Reasons goals not met: due to continued weakness in LB pt requiring significant A for standing and his balance in standing   Recommendation:  Patient will benefit from ongoing skilled OT services in home health setting to continue to advance functional skills in the area of BADL and Reduce care partner burden.  Equipment: Drop Arm BSC and pt has built in shower bench  Reasons for discharge: treatment goals met and discharge from hospital  Patient/family agrees with progress made and goals achieved: Yes  OT Discharge Vision Baseline Vision/History: Wears glasses Wears Glasses: Reading only Vision Assessment?: No apparent visual deficits Perception  Perception: Within Functional Limits Praxis Praxis: Intact Cognition Overall Cognitive Status: Within Functional Limits for tasks assessed Arousal/Alertness: Awake/alert Orientation Level: Oriented X4 Attention: Selective Sustained Attention: Appears intact Selective Attention: Appears intact Memory: Appears intact Awareness: Appears intact Problem  Solving: Appears intact Safety/Judgment: Appears intact Sensation Sensation Light Touch: Appears Intact Stereognosis: Appears Intact Hot/Cold: Appears Intact Proprioception: Appears Intact Additional Comments: Sensation intact in BUEs Coordination Gross Motor Movements are Fluid and Coordinated: No Fine Motor Movements are Fluid and Coordinated: No Coordination and Movement Description: Pt with increased tremors in BUEs affecting FM coordination at times such as self feeding.  He could use the UEs WFLS for bathing, dressing, and grooming tasks with increased time.  Motor  Motor Motor: Paraplegia Motor - Skilled Clinical Observations: Motor control wanes quickly     Trunk/Postural Assessment  Cervical Assessment Cervical Assessment: Within Functional Limits Thoracic Assessment Thoracic Assessment: Within Functional Limits Lumbar Assessment Lumbar Assessment: Exceptions to [facility] Lumbar AROM Overall Lumbar AROM Comments: slight lordosis Postural Control Postural Control: Deficits on evaluation Righting Reactions: Insufficient, relies on UE's for control  Balance Dynamic Sitting Balance Dynamic Sitting - Balance Support: During functional activity Dynamic Sitting - Level of Assistance: 6: Modified independent (Device/Increase time) Extremity/Trunk Assessment RUE Strength RUE Overall Strength Comments: AROM shoulder flexion 0-150 degrees with strength at a 4-/5 level.  Elbow flexion 4+/5 as well as gross grasp.  Elbow extension 4-/5 as well.  LUE Assessment LUE Assessment: Exceptions to [facility] LUE Strength LUE Overall Strength Comments: AROM shoulder flexion 0-150 degrees with strength at a 4-/5 level.  Elbow flexion 4+/5 as well as gross grasp.  Elbow extension 4-/5 as well.    See Function Navigator for Current Functional Status.  Leotis Shames [facility] 12/14/2016, 2:58 PM

## 2016-12-14 NOTE — Progress Notes (Signed)
Social Work  Discharge Note  The overall goal for the admission was met for:   Discharge location: Yes - home with wife able to provide 24/7 assistance  Length of Stay: Yes - 22 days (with discharged on 12/15/16)  Discharge activity level: Yes - minimal assistance overall and mod in for w/c mobility  Home/community participation: Yes  Services provided included: MD, RD, PT, OT, RN, TR, Pharmacy and [place]: [facility] Medicare  Follow-up services arranged: Outpatient: PT, OT at [facility], DME: 18x18 lightweight w/c, basic cushion, drop arm commode and 30" transfer board via [facility] and Patient/Family request agency HH: requests [facility] for OPtx, DME: NA  Comments (or additional information):  Patient/Family verbalized understanding of follow-up arrangements: Yes  Individual responsible for coordination of the follow-up plan: pt  Confirmed correct DME delivered: Eduardo Burch 12/14/2016    Saleema Weppler

## 2016-12-14 NOTE — Discharge Summary (Signed)
NAMEWARWICK, NICK                 ACCOUNT NO.:  [id number]  MEDICAL RECORD NO.:  [id number]  LOCATION:                                 FACILITY:  PHYSICIAN:  Meredith Staggers, M.D.     DATE OF BIRTH:  DATE OF ADMISSION:  11/23/2016 DATE OF DISCHARGE:  12/15/2016                              DISCHARGE SUMMARY   DISCHARGE DIAGNOSES: 1. 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 2. Subcutaneous Lovenox for deep vein thrombosis prophylaxis, pain     management. 3. Hypertension. 4. History of prostate cancer with prostatectomy. 5. Constipation.  HISTORY OF PRESENT ILLNESS:  This is a 76 year old right-handed male, history of hypertension, prostate cancer, Paget disease, who presented on November 12, 2016, with bilateral lower extremity weakness as well as decrease in functional mobility that progressed over 4 months.  He was having recent fall as well as reported 50-pound weight loss.  He lives with his spouse, was working part-time prior to admission.  He has had extensive workups over the past months including MRI, PET scan, ENG studies completed at [facility], followed by Neurology Services, Dr. Candace Gallus, Neurology, as well as workup at [facility]. 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  CT of abdomen, chest, and pelvis revealed potential area of circumferential colonic wall thickening near the rectosigmoid junction.  No findings of typical metastatic disease.  Plan was to follow up outpatient with Neurology Services.  Consider possible immunosuppression.  Monthly IVIG versus IV Rituxan.  Subcutaneous Lovenox for DVT prophylaxis.  The patient was admitted for comprehensive rehab  program.  PAST MEDICAL HISTORY:  See discharge diagnoses.  SOCIAL HISTORY:  He lives with spouse.  FUNCTIONAL STATUS UPON ADMISSION TO REHAB SERVICES:  +2 physical assist, sit to stand; max assist, squat pivot transfers; +2 physical assist, supine-to-sit; moderate assist, lower body dressing to total assist.  PHYSICAL EXAMINATION:  VITAL SIGNS:  Blood pressure 146/80, pulse 94, temperature 98, respirations 22. GENERAL:  Alert male, in no acute distress.  EOMs intact. NECK:  Supple.  Nontender, no JVD. CARDIAC:  Rate controlled. ABDOMEN:  Soft, nontender.  Good bowel sounds. LUNGS:  Clear to auscultation. MUSCULOSKELETAL:  Noted resting, question intentional tremor. Persistent mild cogwheeling of the arms, greater than legs with passive range of motion.  Flat masked facies.  Right upper extremity 4/5, proximal to distal; left upper extremity 4- to 4/5.  Right lower extremity 2+ hip flexors, 2+ to 3- knee extension, and 3+/5 ankle dorsi and plantar flexion.  Left lower extremity 2/5 hip flexors, 2+ knee extension, 3/5 ankle dorsi and plantar flexion.  REHABILITATION HOSPITAL COURSE:  The patient was admitted to Inpatient Rehab Services.  Therapies initiated on a 3-hour daily basis, consisting of physical therapy, occupational therapy, and rehabilitation nursing. The following issues were addressed during the patient's rehabilitation stay.  Pertaining to Mr. 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  He had completed a 5-day course of plasmapheresis  on November 21, 2016. Strength and stamina showed some gradual improvement.  However, he later showed some decrease in overall weakness with inconsistent tolerance to therapies.  In regard to these suspect changes, Neurology, Dr. Narda Amber, was contacted.  Order for muscle biopsy was completed, December 13, 2016.  However, nerve biopsy could not be done here in the area, question having to go to an outside tertiary center.  This was  discussed with Neurology Service as well as patient.  There was some consistency as a possible Parkinson-like symptom and this again would be discussed as workup as an outpatient.  Subcutaneous Lovenox ongoing for DVT prophylaxis.  He was using Ultram on a limited basis for pain.  Close monitoring of blood pressure with some bouts of hypotension on lisinopril and Toprol.  Abdominal binder if needed.  History of prostate cancer.  CT abdomen and pelvis showed no metastatic findings.  The patient received weekly collaborative interdisciplinary team conferences to discuss estimated length of stay, family teaching, any barriers to his discharge.  He transfers wheelchair to mat, minimal guards, squat pivot, sit to stand.  Able to tolerate standing.  Sit-to-supine, moderate assist.  Activities of daily living:  Easily fatigued; however, he was able to work through his therapies.  Minimal assist for sliding board transfers.  Ongoing family teaching.  Plan was discharge to home.  DISCHARGE MEDICATIONS: 1. Aspirin 81 mg p.o. daily. 2. Colace 100 mg every 12 hours. 3. Neurontin 300 mg at bedtime. 4. Lisinopril 10 mg p.o. daily. 5. Lopressor 12.5 mg p.o. b.i.d. 6. Potassium chloride 20 mEq p.o. daily. 7. Zanaflex 2 mg p.o. t.i.d. 8. Trazodone 50 mg p.o. at bedtime. 9. Ultram 50 mg every 6 hours as needed for pain.  DIET:  His diet was regular.  FOLLOWUP:  He would follow up with Dr. Alger Simons at the outpatient rehab service office as directed; Dr. Narda Amber, Neurology Services for ongoing workup.  Await plan for nerve biopsy.  Dr. Lavone Orn, medical management.     Lauraine Rinne, P.A.   ______________________________ Meredith Staggers, M.D.    DA/MEDQ  D:  12/14/2016  T:  12/14/2016  Job:  [id number]  cc:   Delanna Ahmadi, M.D. Dr. Quintella Reichert, M.D.

## 2016-12-14 NOTE — Discharge Instructions (Signed)
Inpatient Rehab Discharge Instructions  [place] Discharge date and time: No discharge date for patient encounter.   Activities/Precautions/ Functional Status: Activity: activity as tolerated Diet: regular diet Wound Care: none needed Functional status:  ___ No restrictions     ___ Walk up steps independently ___ 24/7 supervision/assistance   ___ Walk up steps with assistance ___ Intermittent supervision/assistance  ___ Bathe/dress independently ___ Walk with walker     __x_ Bathe/dress with assistance ___ Walk Independently    ___ Shower independently ___ Walk with assistance    ___ Shower with assistance ___ No alcohol     ___ Return to work/school ________   COMMUNITY REFERRALS UPON DISCHARGE:    Outpatient: PT    OT                   Agency:  [facility]     Phone: [phone]                Appointment Date/Time:  10/16 @ 3:30 with OT (please arrive at 3:00)                                                                                                                                     10/17 @ 8:00 with PT   Medical Equipment/Items Ordered:  Wheelchair, cushion, drop arm commode and transfer board                                                       Agency/Supplier:  [facility] @ [phone]      Special Instructions:  Follow-up with neurology services Dr. Posey Pronto  [phone] to discuss nerve biopsy  My questions have been answered and I understand these instructions. I will adhere to these goals and the provided educational materials after my discharge from the hospital.  Patient/Caregiver Signature _______________________________ Date __________  Clinician Signature _______________________________________ Date __________  Please bring this form and your medication list with you to all your follow-up doctor's appointments.

## 2016-12-14 NOTE — Progress Notes (Signed)
Resting throughout shift w/o complaint or discomfort verbalized, Denies need for pain medication.Left upper thigh surgical incision unremarkable, site open to air. Made as comfortable as possible, Call bell within reach safety precaution continue.Marland KitchenAnticipate discharge on  12/15/2016.

## 2016-12-14 NOTE — Progress Notes (Signed)
[facility] Progress Note  1 Day Post-Op  Subjective: CC: BLE weakness Left thigh is a little sore this morning, but feels ok. Patient without questions or concerns. Discussed with patient that he may shower normally and let soap and water run over incision.   Objective: Vital signs in last 24 hours: Temp:  [97.5 F (36.4 C)-97.9 F (36.6 C)] 97.9 F (36.6 C) (10/12 0528) Pulse Rate:  [73-100] 83 (10/12 0528) Resp:  [16-18] 18 (10/12 0528) BP: (93-156)/(62-86) 150/83 (10/12 0528) SpO2:  [98 %-100 %] 98 % (10/11 2115) Last BM Date: 12/12/16  Intake/Output from previous day: 10/11 0701 - 10/12 0700 In: [place] [P.O.:862; I.V.:600] Out: 1050 [Urine:1050] Intake/Output this shift: No intake/output data recorded.  PE: Gen:  Alert, NAD, pleasant Cardio: pedal pulses 2+ BL Pulm:  Normal effort Skin: warm and dry, no rashes; Left lateral thigh incision c/d/i without surrounding erythema, ecchymosis or swelling Psych: A&Ox3   Lab Results:  No results for input(s): WBC, HGB, HCT, PLT in the last 72 hours. BMET No results for input(s): NA, K, CL, CO2, GLUCOSE, BUN, CREATININE, CALCIUM in the last 72 hours. PT/INR No results for input(s): LABPROT, INR in the last 72 hours. CMP     Component Value Date/Time   NA 135 12/10/2016 0926   NA 139 08/16/2016 1106   K 4.7 12/10/2016 0926   CL 102 12/10/2016 0926   CO2 26 12/10/2016 0926   GLUCOSE 93 12/10/2016 0926   BUN 17 12/10/2016 0926   BUN 20 08/16/2016 1106   CREATININE 0.50 (L) 12/10/2016 0926   CALCIUM 8.3 (L) 12/10/2016 0926   PROT 5.2 (L) 12/10/2016 0926   PROT 7.4 08/16/2016 1106   ALBUMIN 2.5 (L) 12/10/2016 0926   ALBUMIN 3.5 09/28/2016 1648   AST 22 12/10/2016 0926   ALT 18 12/10/2016 0926   ALKPHOS 64 12/10/2016 0926   BILITOT 0.3 12/10/2016 0926   BILITOT 0.7 08/16/2016 1106   GFRNONAA >60 12/10/2016 0926   GFRAA >60 12/10/2016 0926   Lipase  No results found for:  LIPASE     Studies/Results: No results found.  Anti-infectives: Anti-infectives    Start     Dose/Rate Route Frequency Ordered Stop   12/13/16 0900  ceFAZolin (ANCEF) IVPB 2g/100 mL premix  Status:  Discontinued     2 g 200 mL/hr over 30 Minutes Intravenous On call to O.R. 12/12/16 2307 12/13/16 0854       Assessment/Plan S/p muscle biopsy of left vastus lateralis - 12/13/16 Dr. Rosendo Gros - POD#1 - patient healing appropriately - follow up with neurologist for pathology results   Plan: Healing well. We will sign off. Call with questions/concerns.   LOS: 21 days    Brigid Re , [facility] 12/14/2016, 9:39 AM Pager: [phone] Consults: [phone] Mon-Fri 7:00 am-4:30 pm Sat-Sun 7:00 am-11:30 am

## 2016-12-14 NOTE — Progress Notes (Signed)
Occupational Therapy Session Note  Patient Details  Name: Eduardo Burch MRN: [id number] Date of Birth: [birth year]  Today's Date: 12/14/2016 OT Individual Time: 0800-0900 OT Individual Time Calculation (min): 60 min    Short Term Goals: Week 2:  OT Short Term Goal 1 (Week 2): Pt will perform squat pivot transfers to drop arm commode with mod assist.  OT Short Term Goal 1 - Progress (Week 2): Progressing toward goal OT Short Term Goal 2 (Week 2): Pt will perform swquat pivot tub bench transfers with mod A OT Short Term Goal 2 - Progress (Week 2): Progressing toward goal OT Short Term Goal 3 (Week 2): Pt will pull up pants over hips with mod A for standing OT Short Term Goal 3 - Progress (Week 2): Progressing toward goal Week 3:  OT Short Term Goal 1 (Week 3): STG=LTGs secondary to ELOS  Skilled Therapeutic Interventions/Progress Updates:    Pt resting in w/c upon arrival with wife present.  Initial focus on practicing transfers with wife assisting/providing supervision.  Transition to review of HEP.  HEP handout provided.  Pt required min verbal cues and demonstration of exercises to complete correctly.  Pt's wife has clear understanding of exercises and provides appropriate supervision/direction.  Pt returned to room and transferred with slide board to bed at supervision level.  Pt remained in bed with all needs within reach.    Therapy Documentation Precautions:  Precautions Precautions: Fall Restrictions Weight Bearing Restrictions: No Pain:  Pt denies pain  See Function Navigator for Current Functional Status.   Therapy/Group: Individual Therapy  Leroy Libman 12/14/2016, 2:33 PM

## 2016-12-14 NOTE — Discharge Summary (Signed)
Discharge summary job [id number]

## 2016-12-14 NOTE — Plan of Care (Signed)
Problem: RH Balance Goal: LTG Patient will maintain dynamic standing with ADLs (OT) LTG:  Patient will maintain dynamic standing balance with assist during activities of daily living (OT)   Outcome: Not Met (add Reason) Requires max A for standing during BADLs

## 2016-12-14 NOTE — Progress Notes (Signed)
Physical Therapy Discharge Summary  Patient Details  Name: Eduardo Burch MRN: [id number] Date of Birth: [birth year]  Today's Date: 12/14/2016   Patient has met 6 of 8 long term goals due to improved activity tolerance, improved postural control and ability to compensate for deficits.  Patient to discharge at a wheelchair level Supervision.  Pt presented with initial progress upon arrival to IP rehab, improving gait distance and activity tolerance, however pt with functional decline during admission with ongoing neuro/medical assessment to determine diagnosis/prognosis. Patient and wife understanding about current level of function and pt limited to w/c level for safety at this time. Patient's care partner is independent to provide the necessary physical assistance at discharge.  Reasons goals not met: Bed mobility occasionally requires minA for LE management d/t fatigue. Gait goal unmet; pt had ambulated up to 30' however demonstrated decline in function while in rehab, goals were downgraded to 36' with minA and since then pt has ambulated 13', most consistently only able to perform gait of 5-8' before fatigued.  Recommendation:  Patient will benefit from ongoing skilled PT services in outpatient setting to continue to advance safe functional mobility, address ongoing impairments in strength, balance, coordination, activity tolerance, and minimize fall risk.  Equipment: w/c, transfer board   Reasons for discharge: treatment goals met and discharge from hospital  Patient/family agrees with progress made and goals achieved: Yes  PT Discharge Precautions/Restrictions  Fall Vital Signs Therapy Vitals Temp: 97.9 F (36.6 C) Temp Source: Oral Pulse Rate: 83 Resp: 18 BP: (!) 150/83 Patient Position (if appropriate): Lying Oxygen Therapy O2 Device: Not Delivered Pain Pain Assessment Pain Assessment: No/denies pain Pain Score: 0-No pain Vision/Perception  Perception Perception: Within  Functional Limits Praxis Praxis: Intact  Cognition Overall Cognitive Status: Within Functional Limits for tasks assessed Arousal/Alertness: Awake/alert Orientation Level: Oriented X4 Attention: Selective Sustained Attention: Appears intact Selective Attention: Appears intact Memory: Appears intact Awareness: Appears intact Problem Solving: Appears intact Safety/Judgment: Appears intact Sensation Sensation Light Touch: Appears Intact Stereognosis: Appears Intact Hot/Cold: Appears Intact Proprioception: Appears Intact Additional Comments: Sensation intact in BUEs Coordination Gross Motor Movements are Fluid and Coordinated: No Fine Motor Movements are Fluid and Coordinated: No Coordination and Movement Description: Pt with increased tremors in BUEs affecting FM coordination at times such as self feeding.  He could use the UEs WFLS for bathing, dressing, and grooming tasks with increased time.  Motor  Motor Motor: Paraplegia Motor - Skilled Clinical Observations: Motor control wanes quickly   Mobility Bed Mobility Bed Mobility: Supine to Sit;Sit to Supine Supine to Sit: 5: Supervision;HOB elevated Sit to Supine: 4: Min assist;HOB elevated Transfers Transfers: Yes Sit to Stand: 2: Max assist;With upper extremity assist;Other/comment (from elevated mat table) Squat Pivot Transfers: 5: Supervision Squat Pivot Transfer Details: Verbal cues for precautions/safety Lateral/Scoot Transfers: 5: Supervision;With slide board Lateral/Scoot Transfer Details: Verbal cues for precautions/safety Locomotion  Ambulation Ambulation: Yes Ambulation/Gait Assistance: 4: Min assist Ambulation Distance (Feet): 8 Feet Assistive device: Rollator Ambulation/Gait Assistance Details: Verbal cues for precautions/safety;Tactile cues for posture;Manual facilitation for weight shifting Gait Gait: Yes Gait Pattern: Impaired Gait Pattern: Poor foot clearance - left;Poor foot clearance - right;Trunk  flexed;Lateral hip instability;Left foot flat;Right foot flat (heavy reliance through UEs) Gait velocity: significantly decreased Stairs / Additional Locomotion Stairs: No Wheelchair Mobility Wheelchair Mobility: Yes Wheelchair Assistance: 6: Modified independent (Device/Increase time) Environmental health practitioner: Both upper extremities Wheelchair Parts Management: Independent Distance: > 150'  Trunk/Postural Assessment  Cervical Assessment Cervical Assessment: Within Functional Limits Thoracic Assessment Thoracic  Assessment: Within Functional Limits Lumbar Assessment Lumbar Assessment: Exceptions to [facility] Lumbar AROM Overall Lumbar AROM Comments: slight lordosis Postural Control Postural Control: Deficits on evaluation Righting Reactions: Insufficient, relies on UE's for control  Balance Dynamic Sitting Balance Dynamic Sitting - Balance Support: During functional activity Dynamic Sitting - Level of Assistance: 6: Modified independent (Device/Increase time) Extremity Assessment  RUE Strength RUE Overall Strength Comments: AROM shoulder flexion 0-150 degrees with strength at a 4-/5 level.  Elbow flexion 4+/5 as well as gross grasp.  Elbow extension 4-/5 as well.  LUE Assessment LUE Assessment: Exceptions to [facility] LUE Strength LUE Overall Strength Comments: AROM shoulder flexion 0-150 degrees with strength at a 4-/5 level.  Elbow flexion 4+/5 as well as gross grasp.  Elbow extension 4-/5 as well.  RLE Assessment RLE Assessment: Exceptions to [facility] RLE Strength RLE Overall Strength: Deficits RLE Overall Strength Comments: 2+/5 throughout hips and knees, DF 3/5 LLE Assessment LLE Assessment: Exceptions to [facility] LLE Strength LLE Overall Strength: Deficits LLE Overall Strength Comments: 2+/5 hips and knees, 3/5 DF    See Function Navigator for Current Functional Status.  [name] 12/14/2016, 7:55 AM

## 2016-12-14 NOTE — Progress Notes (Signed)
Occupational Therapy Note  Patient Details  Name: Eduardo Burch MRN: [id number] Date of Birth: [birth year]  Today's Date: 12/14/2016 OT Individual Time: 1345-1425 OT Individual Time Calculation (min): 40 min   Pt received from PT with wife present.  Focus on review of home safety recommendations and HEP.  Pt and wife stated they were "ready" to go home and feel comfortable with transfers.     Leotis Shames [facility] 12/14/2016, 2:26 PM

## 2016-12-14 NOTE — Progress Notes (Signed)
Physical Therapy Session Note  Patient Details  Name: Eduardo Burch MRN: [id number] Date of Birth: 1940/09/16  Today's Date: 12/14/2016 PT Individual Time: 1000-1100 and 1300-1345 PT Individual Time Calculation (min): 60 min and 45 min (total 105 min)   Short Term Goals: Week 3:  PT Short Term Goal 1 (Week 3): =LTG due to estimated LOS  Skilled Therapeutic Interventions/Progress Updates: Tx 1: Pt received supine in bed, c/o pain as below and agreeable to treatment. Supine>sit and transfer to w/c with S, increased time and use of HOB elevated as he will have bed that elevates at home. W/c propulsion throughout session modI with BUE. Performed car transfer with S. Gait x2 trials at 8' each before fatigued and LEs "giving out" requiring urgent seated rest break. Performed supine core strengthening exercises 2x15-20 reps each per tolerance. Returned to room modI w/c propulsion, returned to bed with S slideboard transfer uphill to bed. minA sit >supine for RLE management. Remained supine in bed at end of session, all needs in reach.   Tx 2: Pt received in bed, c/o pain as below and agreeable to treatment. Supine>sit with S, transfer to w/c S squat pivot. Transfer w/c <>mat table with S squat pivot. Sit <>stand x5 trials from elevated mat table mod/maxA. Pt unable to progress feet to ambulate d/t feeling as though LEs were going to give out and heavy reliance on UEs which were also fatiguing. Performed standing frame 2x7 min while performing heel/toe raises, quad sets, glute sets, mini-squats, and static standing for weight bearing through LEs. Handoff to OT at end of session.      Therapy Documentation Precautions:  Precautions Precautions: Fall Restrictions Weight Bearing Restrictions: No Pain: Pain Assessment Pain Assessment: 0-10 Pain Score: 2  Pain Type: Acute pain Pain Location: Back Pain Orientation: Lower Pain Descriptors / Indicators: Aching Pain Frequency: Intermittent Pain Onset:  On-going Patients Stated Pain Goal: 2 Pain Intervention(s): Medication (See eMAR);Repositioned Multiple Pain Sites: No   See Function Navigator for Current Functional Status.   Therapy/Group: Individual Therapy  Luberta Mutter 12/14/2016, 10:58 AM

## 2016-12-15 NOTE — Progress Notes (Signed)
Restful and w/o distress , Anticipates discharger home today, Left Thigh incision site unremarkable no signs of infection or irritation. Continue regime

## 2016-12-16 LAB — ACETYLCHOLINE RECEPTOR AB, ALL
Acetylchol Block Ab: 11 % (ref 0–25)
Acetylcholine Modulat Ab: <12 % (ref 0–20)

## 2016-12-18 ENCOUNTER — Ambulatory Visit: Payer: Medicare Other | Attending: Internal Medicine | Admitting: Occupational Therapy

## 2016-12-18 ENCOUNTER — Encounter: Payer: Self-pay | Admitting: Occupational Therapy

## 2016-12-18 DIAGNOSIS — R2681 Unsteadiness on feet: Secondary | ICD-10-CM | POA: Diagnosis present

## 2016-12-18 DIAGNOSIS — R208 Other disturbances of skin sensation: Secondary | ICD-10-CM | POA: Diagnosis present

## 2016-12-18 DIAGNOSIS — M6281 Muscle weakness (generalized): Secondary | ICD-10-CM | POA: Diagnosis not present

## 2016-12-18 DIAGNOSIS — R278 Other lack of coordination: Secondary | ICD-10-CM | POA: Diagnosis present

## 2016-12-18 NOTE — Therapy (Signed)
[facility] [address] [place], [place], [address] Phone: [phone]   Fax:  [phone]  Occupational Therapy Evaluation  Patient Details  Name: Eduardo Burch MRN: [id number] Date of Birth: 1940/10/14 Referring Provider: Naaman Plummer  Encounter Date: 12/18/2016      OT End of Session - 12/18/16 1837    Visit Number 1   Number of Visits 17   Date for OT Re-Evaluation 02/16/17   Authorization Type [employer] Medicare, $15 copay, 0 visit limit, 0 auth required   Authorization - Visit Number 1   Authorization - Number of Visits 10   OT Start Time 1884   OT Stop Time 1620   OT Time Calculation (min) 45 min   Activity Tolerance Patient limited by fatigue   Behavior During Therapy [facility] for tasks assessed/performed      Past Medical History:  Diagnosis Date  . Constipated   . Elevated blood pressure reading   . 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 Hypercholesterolemia   . Hypertension   . Spondylosis     Past Surgical History:  Procedure Laterality Date  . MUSCLE BIOPSY Left 12/13/2016   Procedure: LEFT VASTUS LATERAILS MUSCLE BIOPSY;  Surgeon: Ralene Ok, MD;  Location: [facility];  Service: General;  Laterality: Left;  . PROATATECTOMY      There were no vitals filed for this visit.      Subjective Assessment - 12/18/16 1545    Subjective  If it will help my wifew not have to do more, I am in   Patient is accompained by: Family member   Currently in Pain? Yes   Pain Score 3    Pain Location Back   Pain Orientation Lower   Pain Descriptors / Indicators Aching   Pain Type Chronic pain           [facility] OT Assessment - 12/18/16 0001      Assessment   Diagnosis CIDP   Referring Provider Naaman Plummer   Onset Date 04/04/16  anaphylactic shock    Prior Therapy Discharged from IP Rehab 10/13     Precautions   Precautions Fall   Precaution Comments h/o prostate CA with prostatectomy (NO  ESTIM OR KINESIOTAPE), ankylosing spondylitis, HTN-MONITOR VITALS AT [facility] VISIT   Required Braces or Orthoses Other Brace/Splint  Abdominal binder / TED hose for BP mgmt     Balance Screen   Has the patient fallen in the past 6 months No     Prior Function   Level of Independence Independent with basic ADLs   Vocation --  ran a [place] working out at [employer], cooking, gardening, woodworking     ADL   Eating/Feeding Independent   Grooming Independent   Restaurant manager, fast food   Lower Body Bathing Modified independent   Upper Body Dressing Minimal assistance   Lower Body Dressing Maximal assistance   Higher education careers adviser - [place] independent   [place] Transfer Other (comment)   Tub/Shower Transfer Details (indicate cue type and reason Renovating shower- currently sponge bathing     IADL   Prior Level of Function Shopping independent   Shopping Completely unable to shop   Prior Level of Function Meal Prep independent   Meal Prep Needs to have meals prepared and served   Prior Level of Function Nurse, 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   [employer]  Mobility Relies on family or friends for transportation     Written Expression   Dominant Hand Right   Handwriting 100% legible     Vision - History   Baseline Vision Wears glasses all the time     Activity Tolerance   Activity Tolerance --  endurance significantly limits participation     Cognition   Overall Cognitive Status Within Functional Limits for tasks assessed     Sensation   Light Touch Appears Intact  upper extremities, reports tingling face and feet   Hot/Cold Appears Intact     Coordination   Gross Motor Movements are Fluid and Coordinated No   Fine Motor Movements are Fluid and Coordinated No   Finger Nose Finger Test undershooting     ROM / Strength   AROM / PROM / Strength  AROM;Strength     AROM   Overall AROM  Within functional limits for tasks performed   Overall AROM Comments BUE'S     Strength   Overall Strength Deficits   Overall Strength Comments 4-/5 shoulder and elbow     Hand Function   Right Hand Gross Grasp Impaired   Right Hand Grip (lbs) 35   Left Hand Gross Grasp Impaired   Left Hand Grip (lbs) 35                         OT Education - 12/18/16 1837    Education provided Yes   Education Details reviewed OT eval results, and plan for treatment / potential goals   Person(s) Educated Patient;Spouse   Methods Explanation   Comprehension Verbalized understanding          OT Short Term Goals - 12/18/16 1848      OT SHORT TERM GOAL #1   Title Patient will complete HEP for UE strengthening - hands - with min set up   Time 4   Period Weeks   Status New   Target Date 01/17/17     OT SHORT TERM GOAL #2   Title Patient will don socks with mod assist   Time 4   Period Weeks   Status New     OT SHORT TERM GOAL #3   Title Patient will don pants with mod assist   Time 4   Period Weeks   Status New     OT SHORT TERM GOAL #4   Title Patient will don shoes with mod assist   Time 4   Period Weeks   Status New     OT SHORT TERM GOAL #5   Title Patient will transfer with modified independence to/from toilet   Time 4   Period Weeks   Status New           OT Long Term Goals - 12/18/16 1850      OT LONG TERM GOAL #1   Title Patient will complete HEP UE strengthening program independently   Time 8   Period Weeks   Status New   Target Date 02/16/17     OT LONG TERM GOAL #2   Title Patient will dress himself with modified independence   Time 8   Period Weeks   Status New     OT LONG TERM GOAL #3   Title Patient will shower with set-up / supervision assist   Time 8   Period Weeks   Status New     OT LONG TERM GOAL #4   Title Patient will  explore options for driving   Time 8   Period Weeks    Status New     OT LONG TERM GOAL #5   Title Patient will prepare a simple hot meal for he and his wife with intermittent assistance   Time 8   Period Weeks   Status New               Plan - 12/18/16 1838    Clinical Impression Statement Patient is a 76 year old male with recent release from inpatient rehab days ago, with significant weakness LE>UE, and poor activity tolerance limiting participation in even basic self care skills.  Patient has a current diagnosis of CIDP, Motor variant, however is undergoing further work up for diagnostic and treament clarification.  Patient was independent and active until the beginning of this year when he has demonstrated a significant decline in weight and overall mobility, strength and endurance.     Occupational Profile and client history currently impacting functional performance Patient has a history of hypertension, Paget Disease and progressive weakening of BLE's.  Patient lives at home with his wife Mechele Claude.  He was very active prior to recent decline.     Occupational performance deficits (Please refer to evaluation for details): ADL's;IADL's;Rest and Sleep;Work;Leisure   Rehab Potential Good   OT Frequency 2x / week   OT Duration 8 weeks   OT Treatment/Interventions Self-care/ADL training;Aquatic Therapy;Therapeutic exercise;Neuromuscular education;Moist Heat;Energy conservation;DME and/or AE instruction;Functional Mobility Training;Patient/family education;Balance training;Therapeutic activities   Plan Review IP HEP and reduce exercises, mat mobility, sit to stand   Clinical Decision Making Several treatment options, min-mod task modification necessary   Recommended Other Services PT eval 12/19/16   Consulted and Agree with Plan of Care Patient;Family member/caregiver      Patient will benefit from skilled therapeutic intervention in order to improve the following deficits and impairments:  Decreased activity tolerance, Decreased balance,  Decreased coordination, Decreased safety awareness, Decreased mobility, Decreased knowledge of use of DME, Decreased endurance, Decreased skin integrity, Decreased strength, Difficulty walking, Impaired UE functional use, Impaired tone, Impaired sensation, Impaired perceived functional ability, Increased muscle spasms, Pain  Visit Diagnosis: Muscle weakness (generalized) - Plan: Ot plan of care cert/re-cert  Other lack of coordination - Plan: Ot plan of care cert/re-cert  Other disturbances of skin sensation - Plan: Ot plan of care cert/re-cert  Unsteadiness on feet - Plan: Ot plan of care cert/re-cert      G-Codes - 11/91/47 1854    Functional Assessment Tool Used (Outpatient only) skilled clinical judgement   Functional Limitation Self care   Self Care Current Status [phone]) At least 60 percent but less than 80 percent impaired, limited or restricted   Self Care Goal Status (O1308) At least 20 percent but less than 40 percent impaired, limited or restricted      Problem List Patient Active Problem List   Diagnosis Date Noted  . Ankle pain 11/19/2016  . Hyperbilirubinemia 11/17/2016  . Leukocytosis 11/14/2016  . Hypophosphatemia 11/14/2016  . Dysphagia 11/14/2016  . Abnormal LFTs 11/14/2016  . Lower extremity weakness 11/13/2016  . CIDP (chronic inflammatory demyelinating polyneuropathy) ([place]) 11/12/2016  . Back pain without sciatica 10/25/2016  . 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 Hyperlipidemia 10/03/2016  . H/O prostate cancer 10/03/2016  . Hyponatremia 10/03/2016  . Leg weakness 09/28/2016  . Constipation   . Tachycardia 08/16/2016  . Hypertension, essential 08/15/2016    Mariah Milling, OTR/L 12/18/2016, 6:59 PM  [facility] [address] [place], [place], [address] Phone: [phone]   Fax:  [phone]  Name: Eduardo Burch MRN: [id number] Date of Birth: 30-Aug-1940

## 2016-12-19 ENCOUNTER — Telehealth: Payer: Self-pay | Admitting: Interventional Cardiology

## 2016-12-19 ENCOUNTER — Encounter: Payer: Self-pay | Admitting: Occupational Therapy

## 2016-12-19 ENCOUNTER — Ambulatory Visit: Payer: Medicare Other | Admitting: Physical Therapy

## 2016-12-19 NOTE — Telephone Encounter (Signed)
New Message  Pt c/o BP issue: STAT if pt c/o blurred vision, one-sided weakness or slurred speech  1. What are your last 5 BP readings? Before getting up this morning 109/76 after coming home from breakfast 95/69. About 45 mins ago 110/62  2. Are you having any other symptoms (ex. Dizziness, headache, blurred vision, passed out)? Dizziness, losses color.. Turns pale , some light headedness...  3. What is your BP issue? Per pt wife would like to speak wo RN about pts medications possibly being the issue. Please call back to discuss

## 2016-12-19 NOTE — Telephone Encounter (Signed)
Spoke with wife, DPR on file.  She states pt recently d/c'ed from hospital and Metoprolol Succinate 25mg  QD was changed to Metoprolol Tartrate 12.5mg  BID.  Pt also taking Lisinopril 10mg  QD.  BP has been fluctuating.  Wife states pt c/o dizziness, fatigue and turns pale at times.  When he is like this his BP is always really low 90-100/60-70.  When systolic below 606, pt is symptomatic.  Pt did not have Lisinopril today d/t BP already low this AM at 109/76, then dropped to 95/69.  It did come back up to 110/62.  Wife states his BP seems to always drop when he is exerting.  Advised I would send message to Dr. Tamala Julian for review and advisement.

## 2016-12-19 NOTE — Telephone Encounter (Signed)
Spoke with wife and made her aware of recommendations per Dr. Tamala Julian.  Wife verbalized understanding and was in agreement with this plan.

## 2016-12-19 NOTE — Telephone Encounter (Signed)
Stop lisinopril. Continue to record blood pressures. Keep [place] informed.

## 2016-12-21 ENCOUNTER — Ambulatory Visit (INDEPENDENT_AMBULATORY_CARE_PROVIDER_SITE_OTHER): Payer: Medicare Other | Admitting: Neurology

## 2016-12-21 ENCOUNTER — Encounter: Payer: Self-pay | Admitting: Neurology

## 2016-12-21 ENCOUNTER — Other Ambulatory Visit: Payer: Medicare Other

## 2016-12-21 VITALS — BP 104/70 | HR 102

## 2016-12-21 DIAGNOSIS — G6181 Chronic inflammatory demyelinating polyneuritis: Secondary | ICD-10-CM

## 2016-12-21 NOTE — Progress Notes (Addendum)
Follow-up Visit   Date: 12/21/16    Eduardo Burch MRN: [id number] DOB: [birth year]   Interim History: Eduardo Burch is a 76 y.o. 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  The patient was accompanied to the clinic by wife who also provides collateral information.    History of present illness Starting around mid June 2017, patient started having severe low back pain followed by numbness of the buttocks region, thigh, and weakness of his upper legs. A few weeks later, he started having difficulty with climbing stairs and after being evaluated by his primary care doctor's office, was referred directly to the emergency room for evaluation. He was admitted to [facility] on 7/27 through 10/04/2015 and was seen by the neurology team with high clinical suspicion for Guillain-Barr Syndrome. Exam was notable for 4/5 bilateral lower extremity strength, diminished sensation in the legs, areflexia, and coarse action tremor in bilateral upper extremities. He underwent extensive testing including CSF analysis which showed albuminocytologic dissociation (CSF protein 215 with normal cell count and glucose) and he was started on IVIG for 5 days. Physical therapy assessed him for outpatient PT and recommended using a cane for assistance, which he was unwilling to use initially.   Imaging of the lumbar and thoracic spine with and without contrast did not show any nerve root enhancement, however did demonstrate osseous changes at L5, S1, and left ilium and multiple scattered hemangiomas throughout thoracic spine consistent with Paget's disease.  PET scan in June 2018 was negative, that this was not felt to be metastatic in nature.  He went to out-patient PT and continued to have steady declined and suffered a fall in late August and  started using a walker.  Because of progressive weakness, he was evaluated at [facility] by neurosurgeon Dr. Nikki Dom, who did not see anything surgical causing his symptoms. He had NCS/EMG of the right arm and leg at [facility] which showed active on chronic L4-L5 polyradiculoneuropathy.  He was then referred to see me by Dr. Vertell Limber due to his rapidly progressive weakness.  He saw me on 9/10 and as he was leaving his home to come to the appointment, his legs completely buckled and gave-out on him while he was trying to go down the steps of his front porch. He struggled to get into the car with his wife.  Once at our building, they used a wheelchair, but neither patient nor his wife feel that he is able to stand or get back into his car.  He also complains of difficulty swallowing and unable to take vitamin capsules.  His voice has also become hoarse. He saw ENT on 8/30 at who recommended MBS.   Of note, he has lost a remarkable 50lb over the past few months. He also complains of night sweats and chills.    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  Because of his progressive weight loss, paraneoplastic panel was also checked which returned negative. He completed 5 days of plasmapheresis which she felt improved his upper extremity strength, however no change with his leg strength. He also underwent CT chest, abdomen, pelvis which showed a circumferential colonic wall thickening near the rectosigmoid normal  junction, recommend colonoscopy which has not been scheduled.  Because of his progressive weakness despite IVIG and plasmapheresis, he underwent muscle biopsy on 12/13/2016 which results are not yet available. Nerve biopsy was unable to be performed while he was hospitalized. He had transient improvement and ability to  stand two days following his rehab and walk some, but since then has not been able to stand. He returned home from rehabilitation on October 13. Unfortunately, he legs remains profoundly weak and he is unable to stand or walk.  He has no appetite and drink 1-2 boost daily.      Medications:  Current Outpatient Prescriptions on File Prior to Visit  Medication Sig Dispense Refill  . acetaminophen (TYLENOL) 500 MG tablet Take 500-1,000 mg by mouth every 6 (six) hours as needed for headache.    Marland Kitchen aspirin 81 MG chewable tablet Chew 1 tablet (81 mg total) by mouth daily. 30 tablet 0  . bisacodyl (DULCOLAX) 5 MG EC tablet Take 1 tablet (5 mg total) by mouth daily as needed for moderate constipation. (Patient taking differently: Take 5 mg by mouth every other day. ) 30 tablet 0  . docusate sodium (COLACE) 100 MG capsule Take 1 capsule (100 mg total) by mouth every 12 (twelve) hours. (Patient taking differently: Take 100 mg by mouth every other day. ) 60 capsule 0  . gabapentin (NEURONTIN) 300 MG capsule Take 1 capsule (300 mg total) by mouth at bedtime. 30 capsule 0  . metoprolol tartrate (LOPRESSOR) 25 MG tablet Take 0.5 tablets (12.5 mg total) by mouth 2 (two) times daily. 30 tablet 0  . traMADol (ULTRAM) 50 MG tablet Take 1 tablet (50 mg total) by mouth every 6 (six) hours as needed for moderate pain or severe pain. 30 tablet 0  . traZODone (DESYREL) 50 MG tablet Take 1 tablet (50 mg total) by mouth at bedtime. 30 tablet 0  . cyclobenzaprine (FLEXERIL) 5 MG tablet Take 1 tablet (5 mg total) by mouth 3 (three) times daily as needed for muscle spasms. (Patient not taking: Reported on 12/21/2016) 30 tablet 0  . tiZANidine (ZANAFLEX) 2 MG tablet Take 1 tablet (2 mg total) by mouth 3 (three) times daily. (Patient not taking: Reported on 12/21/2016) 30 tablet 5   No current facility-administered medications on file prior to visit.     Allergies:  Allergies  Allergen Reactions  . Remicade [Infliximab]  Anaphylaxis    Review of Systems:  CONSTITUTIONAL: No fevers, chills, night sweats, +weight loss.  EYES: No visual changes or eye pain ENT: No hearing changes.  No history of nose bleeds.   RESPIRATORY: No cough, wheezing and shortness of breath.   CARDIOVASCULAR: Negative for chest pain, and palpitations.   GI: Negative for abdominal discomfort, blood in stools or black stools.  No recent change in bowel habits.   GU:  No history of incontinence.   MUSCLOSKELETAL: No history of joint pain or swelling.  No myalgias.   SKIN: Negative for lesions, rash, and itching.   ENDOCRINE: Negative for cold or heat intolerance, polydipsia or goiter.   PSYCH:  No depression or anxiety symptoms.   NEURO: As Above.   Vital Signs:  BP 104/70   Pulse (!) 102   SpO2 96%  Pain Scale: 0 on a scale of 0-10   General: Comfortable appearing, sitting in wheelchair  Neurological Exam: MENTAL STATUS including orientation to time, place, person, recent and remote memory, attention span and concentration, language, and fund of knowledge is normal.  Speech is not dysarthric.  CRANIAL NERVES: Pupils equal round and reactive to light.  Normal conjugate, extra-ocular eye movements in all directions of gaze.  No ptosis.  Face is symmetric. Palate elevates symmetrically.  Tongue is midline.  MOTOR:  Generalized loss of muscle bulk and severe atrophy of the legs and quadriceps.  No fasciculations.  Intermittent coarse tremor of the right > left hand. No pronator drift.  Tone is normal.    Right Upper Extremity:    Left Upper Extremity:    Deltoid  5/5   Deltoid  5/5   Biceps  5/5   Biceps  5/5   Triceps  5/5   Triceps  5/5   Wrist extensors  5/5   Wrist extensors  5/5   Wrist flexors  5/5   Wrist flexors  5/5   Finger extensors  5/5   Finger extensors  5/5   Finger flexors  5/5   Finger flexors  5/5   Dorsal interossei * improved 5/5   Dorsal interossei * 5/5   Abductor pollicis  5/5   Abductor pollicis  5/5     Tone (Ashworth scale)  0  Tone (Ashworth scale)  0   Right Lower Extremity:    Left Lower Extremity:    Hip flexors  2/5   Hip flexors  2/5   Hip extensors  3/5   Hip extensors  3/5   Adductor 4+/5  Adductor 4+/5  Abductor 3/5  Abductor 3/5  Knee flexors  3/5   Knee flexors  3/5   Knee extensors  3/5   Knee extensors  3-/5   Dorsiflexors  4/5   Dorsiflexors  4-/5   Plantarflexors  4/5   Plantarflexors  4/5   Toe extensors  4/5   Toe extensors  4/5   Toe flexors  4/5   Toe flexors  4/5   Tone (Ashworth scale)  0  Tone (Ashworth scale)  0   MSRs:  Diffusely arreflexic  SENSORY:  Vibration is reduced at knees and absent at the ankles  COORDINATION/GAIT:  He is unable to stand. Gait is not tested due to weakness  Data: CSF 09/28/2016: R2 W3 P215** G55  IgG index 0.7  OCB none  HSV, fungal cultures negative  Labs 11/12/2016: Paraneoplastic and sensorimotor autoimmune neuropathy panel negative; folate 26, ESR 47, CRP 0.8, vitamin B 12 495, vitamin B 139, Lyme negative, copper 108, SPEP suggestive of subacute inflammatory response  MRI pelvis 09/12/2016: This addendum is given for the purpose of noting the L5-S1 level is autologously fused as seen on the prior CT scan. There is mild degenerative endplate edema in the superior endplate of L5. Disc bulge with endplate spur at R3-U0 does not appear to cause central canal stenosis.  MRI lumbar spine wwo contrast 09/24/2016: 1. No aggressive osseous lesion to suggest metastatic disease. 2. Osseous changes at L5, S1 and left ilium most compatible with Paget's disease. 3. Lumbar spine spondylosis as described above.  CT head 09/28/2016: Negative for acute hemorrhage. Mild atrophy with asymmetric enlargement of the left lateral ventricle temporal horn. Left temporal horn enlargement is of uncertain etiology.  MRI thoracic spine wwo contrast 10/02/2016: 1. Normal spinal cord signal. No enhancement. 2. Multiple scattered hemangiomas throughout  the thoracic spine. Two lesions in the T6 and T11 vertebral bodies demonstrate hypointensity on T1, and are favored to represent atypical hemangiomas over osseous metastatic disease given recent negative bone scan. Correlation with PSA is recommended. Also consider short-term  follow-up MRI in 3-6 months. 3. Mild chronic anterior wedging of the T7 through T9 vertebral bodies.  NCS/EMG of the right arm and leg 10/08/2016:  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  No evidence of large fiber polyneuropathy of myopathy. [personally reviewed, only right sural and ulnar sensory responses were checked which were normal; motor - R ulnar, R tibial, R peroneal is normal; bilateral tibial H is absent.  Needle of genioglossus, deltoid, FDI is normal; R TP, TA, VL with active denervation; lumbar PSP normal]  MRI brain wwo contrast 10/26/2016:  Negative  MRI cervical spine wwo 10/26/2016:  Heterogeneous marrow signal without evidence of focal cervical spine osseous lesion. A benign osseous hemangioma is noted in the T2 vertebral  body.  CT chest, abdomen pelvos wwo contrast 11/20/2016:  1. No findings typical for metastatic prostate cancer in the chest, abdomen or pelvis. 2. Potential areas of circumferential colonic wall thickening near the rectosigmoid junction. These could be secondary to peristalsis. Correlation with the patient's colon cancer screening history is recommended. If screening is not up-to-date, appropriate screening/sigmoidoscopy should be considered to exclude neoplasm. 3. Clustered right upper lobe pulmonary nodularity, likely postinflammatory. 4. 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 Recommend annual imaging followup by CTA or MRA. This recommendation follows 2010 ACCF/AHA/AATS/ACR/ASA/SCA/SCAI/SIR/STS/SVM Guidelines for the Diagnosis and Management of Patients with Thoracic Aortic Disease.Circulation. 2010; 121: e266-e369 5. Additional incidental findings  including cholelithiasis, colonic diverticulosis and Paget's disease in the left pelvis.  IMPRESSION/PLAN: Mr. 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 Symptoms started in mid June 2018 and plateaued with IVIG until the end of August, however in September started becoming progressively weaker. He was therefore re admitted for plasmapheresis 5 with improvement of hand strength only.  His evaluation has been extensive and shows albuminocytologic dissociation on CSF (protein 215). 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 Laboratory testing for vitamin deficiency, paraproteinemias, paraneoplastic panel and sensorimotor autoimmune neuropathy panel is negative.  I am still very concerned about a toxic/inflammatory mediated neuropathy, with CIDP variant high on the differential.    Because he did not improve traditional therapies for CIDP, he underwent muscle biopsy to look for infiltrative disease which results are not available yet. Unfortunately, sural nerve biopsy could not be performed while he was hospitalized. We had a lengthy discussion about the possibilities and further investigative workup including laboratory testing for autoimmune disease, nerve biopsy, and repeat electrodiagnostic testing.  He is agreeable to further laboratory testing and is very reluctant to repeat NCS/EMG.    Pending the results of his muscle biopsy, I may consider a trial of solumedrol only if it is safe from a rheumatological standpoint, given his Paget's disease.  If steroids is not an option, I will discuss with Dr. Amil Amen about rituximab/cyclophosphamide, but will want to repeat his NCS/EMG prior to more aggressive immunotherapy.    PLAN/RECOMMENDATIONS:  1.  Follow-up with tissue pathology from his muscle biopsy 2.  Check ANA, RF, SSA/A, ACE, c-ANCA/p-ANCA,  cryoglobulumin, SPEP with IFE, CK, aldolase, heavy metal screen 3.  Patient will consider repeating NCS/EMG and/or sural nerve biopsy based on the results of his muscle biopsy 4.  Start home physical therapy for leg strengthening and gait training 5.  Recommend he undergo colonoscopy as part of his malignancy screening 6.  Signs of malnutrition given low albumin, will check  prealbumin.  Encouraged him to increase Boost and discuss with his PCP about appetite stimulants  Greater than 50% of this 45 minute visit was spent in counseling, explanation of diagnosis, planning of further management, and coordination of care due to the complexity of his condition.   Thank you for allowing me to participate in patient's care.  If I can answer any additional questions, I would be pleased to do so.    Sincerely,    Donika K. Posey Pronto, DO

## 2016-12-21 NOTE — Patient Instructions (Addendum)
Check labs   Start increasing your appetite by eating more smoothies and adding protein powder/Boost supplement  We will call you as soon as we have the results of your biopsy  I will be in touch with Dr. Amil Amen if it is safe to use steroids   Home physical therapy  Return to clinic in 6 weeks

## 2016-12-24 ENCOUNTER — Other Ambulatory Visit (INDEPENDENT_AMBULATORY_CARE_PROVIDER_SITE_OTHER): Payer: Medicare Other

## 2016-12-24 DIAGNOSIS — G6181 Chronic inflammatory demyelinating polyneuritis: Secondary | ICD-10-CM

## 2016-12-24 LAB — CK: Total CK: 39 U/L (ref 7–232)

## 2016-12-25 ENCOUNTER — Encounter (HOSPITAL_COMMUNITY): Payer: Self-pay

## 2016-12-26 ENCOUNTER — Telehealth: Payer: Self-pay | Admitting: Interventional Cardiology

## 2016-12-26 ENCOUNTER — Encounter: Payer: Self-pay | Admitting: Interventional Cardiology

## 2016-12-26 DIAGNOSIS — I515 Myocardial degeneration: Secondary | ICD-10-CM | POA: Insufficient documentation

## 2016-12-26 NOTE — Telephone Encounter (Signed)
Spoke with wife and pt and went over recommendations per Dr. Tamala Julian.  Pt seen PCP yesterday, had an episode where he got cold sweats and turned pale and they said PCP didn't do anything about it.  Wife states BP was low at PCP but she doesn't remember the numbers.  Wife states every time pt begins to feel the dizziness he says that he needs to go to bed.  Pt agreeable to stop Metoprolol but does not want to proceed with Lexiscan at this time.  Pt states that he can not tolerate being in the office for that long for a test.  Pt can only tolerate being out of bed for about 45 mins at a time.  Pt wants to try stopping the Metoprolol and see how he does and he will think about doing the Lexiscan if no improvement.  Advised I will send message to let Dr. Tamala Julian know.

## 2016-12-26 NOTE — Telephone Encounter (Signed)
Needs to see PCP for further evaluation. Stop metoprolol. Schedule [employer], ASAP

## 2016-12-26 NOTE — Telephone Encounter (Signed)
Okay 

## 2016-12-26 NOTE — Telephone Encounter (Signed)
Pt c/o BP issue: STAT if pt c/o blurred vision, one-sided weakness or slurred speech  1. What are your last 5 BP readings? Today    BP  130/84   7:40a    88/66    8:34a     136/80 laying down    9:07a **  Tues /23  82/48  9pm  2. Are you having any other symptoms (ex. Dizziness, headache, blurred vision, passed out)? Yes face tingling, cold sweats after any activity   3. What is your BP issue? Meds?  Per wife not sure why this is happning

## 2016-12-26 NOTE — Telephone Encounter (Signed)
Left message to call back  

## 2016-12-26 NOTE — Telephone Encounter (Signed)
Call from patient's wife who states blood pressures while in bed are good.  Once patient starts to exert himself with activities of daily living he becomes symptomatic with cold sweats, dizziness and facial tingling.   Today    BP  130/84   7:40a    88/66    8:34a     136/80 laying down    9:07a **  Tues /23  82/48  9pmToday    BP  130/84   7:40a    88/66    8:34a     136/80 laying down    9:07a **  Tues /23  82/48  9pm Please advise.   Georgana Curio MHA RN CCM

## 2016-12-27 ENCOUNTER — Telehealth: Payer: Self-pay | Admitting: Neurology

## 2016-12-27 NOTE — Telephone Encounter (Signed)
lIJI (PT) called to get verbal orders on this patient. 1x1/ 3x1 /2x3. Please call. Thanks

## 2016-12-28 ENCOUNTER — Encounter (HOSPITAL_COMMUNITY): Payer: Self-pay

## 2016-12-28 NOTE — Telephone Encounter (Signed)
Verbal orders given for PT. 

## 2016-12-30 LAB — IMMUNOFIXATION ELECTROPHORESIS
IMMUNOFIX ELECTR INT: NOT DETECTED
IMMUNOGLOBULIN A: 269 mg/dL (ref 81–463)
IgG (Immunoglobin G), Serum: 643 mg/dL — ABNORMAL LOW (ref 694–1618)
IgM, Serum: 54 mg/dL (ref 48–271)

## 2016-12-30 LAB — PROTEIN ELECTROPHORESIS, SERUM
ALPHA 2: 1.3 g/dL — AB (ref 0.5–0.9)
Albumin ELP: 3.2 g/dL — ABNORMAL LOW (ref 3.8–4.8)
Alpha 1: 0.3 g/dL (ref 0.2–0.3)
Beta 2: 0.4 g/dL (ref 0.2–0.5)
Beta Globulin: 0.4 g/dL (ref 0.4–0.6)
Gamma Globulin: 0.6 g/dL — ABNORMAL LOW (ref 0.8–1.7)
Total Protein: 6.2 g/dL (ref 6.1–8.1)

## 2016-12-30 LAB — ALDOLASE: Aldolase: 5 U/L (ref <=8.1)

## 2016-12-30 LAB — SJOGREN'S SYNDROME ANTIBODS(SSA + SSB)
SSA (Ro) (ENA) Antibody, IgG: <1 AI
SSB (LA) (ENA) ANTIBODY, IGG: NEGATIVE AI

## 2016-12-30 LAB — HEAVY METALS PANEL, BLOOD: LEAD: 1 ug/dL (ref <5)

## 2016-12-30 LAB — CRYOGLOBULIN: Cryoglobulin, Qualitative Analysis: NOT DETECTED

## 2016-12-30 LAB — ANA: Anti Nuclear Antibody(ANA): NEGATIVE

## 2016-12-30 LAB — PREALBUMIN: PREALBUMIN: 27 mg/dL (ref 21–43)

## 2016-12-30 LAB — ANCA SCREEN W REFLEX TITER: ANCA Screen: NEGATIVE

## 2016-12-30 LAB — RHEUMATOID FACTOR

## 2016-12-30 LAB — ANGIOTENSIN CONVERTING ENZYME: ANGIOTENSIN-CONVERTING ENZYME: 36 U/L (ref 9–67)

## 2016-12-31 ENCOUNTER — Telehealth: Payer: Self-pay | Admitting: Neurology

## 2016-12-31 NOTE — Telephone Encounter (Signed)
Called patient and discussed the results of his muscle biopsy which shows neurogenic atrophy, no evidence of inflammation or infiltrative disease.  Because of his refractory CIDP which did not improve with plasmapheresis or IVIG, I would like to treat him with Solu-Medrol 1 g x 5 days.  This has been discussed with Dr. Amil Amen, patient's rheumatologist, who he sees for Paget's disease and ankylosing spondylitis who is okay with me proceeding with corticosteroids.  Patient would like to have his steroid infusions at Dr. Melissa Noon office, which we will try to coordinate.  If patient agreeable, I would like updated electrodiagnostic testing if there is no improvement with steroids.  All questions were answered.  Analynn Daum K. Posey Pronto, DO

## 2017-01-01 ENCOUNTER — Telehealth: Payer: Self-pay | Admitting: Neurology

## 2017-01-01 NOTE — Telephone Encounter (Signed)
Called Dr. Melissa Noon office and LM for the nurse to call me back.

## 2017-01-01 NOTE — Telephone Encounter (Signed)
See next note

## 2017-01-01 NOTE — Telephone Encounter (Signed)
Patient's wife called to check status on Dr. Amil Amen and the Steroid Infusion. Also, Jeani Hawking called from Dr. Melissa Noon office and would like for you to call her at [phone]. Please Call. Thanks

## 2017-01-01 NOTE — Telephone Encounter (Signed)
I have spoken with Eduardo Burch and she is running the infusion by Dr. Amil Amen and will call back with an answer.  Also called [name] and let her know that I am still working on this.

## 2017-01-02 ENCOUNTER — Ambulatory Visit: Payer: Medicare Other | Admitting: Physical Therapy

## 2017-01-02 ENCOUNTER — Encounter: Payer: Self-pay | Admitting: Occupational Therapy

## 2017-01-02 ENCOUNTER — Other Ambulatory Visit: Payer: Self-pay | Admitting: *Deleted

## 2017-01-02 ENCOUNTER — Telehealth: Payer: Self-pay | Admitting: Neurology

## 2017-01-02 NOTE — Telephone Encounter (Signed)
Nira Conn called regarding needing to speak with you regarding the patient being able to start the Solumedral infusions in their office. They do need his ins information to get his authorization. Heather's ext [phone] and Lori's is 105. She will need a new order faxed. Thanks

## 2017-01-02 NOTE — Telephone Encounter (Signed)
Heather returned your call. She said you could fax the order to [phone]. Her # is [phone] EXT [phone]. Thanks

## 2017-01-02 NOTE — Telephone Encounter (Signed)
LM to call me back.

## 2017-01-03 ENCOUNTER — Other Ambulatory Visit: Payer: Self-pay | Admitting: *Deleted

## 2017-01-03 DIAGNOSIS — G6181 Chronic inflammatory demyelinating polyneuritis: Secondary | ICD-10-CM

## 2017-01-03 MED ORDER — SODIUM CHLORIDE 0.9 % IV SOLN: 1000.0000 mg | Freq: Every day | INTRAVENOUS | Status: AC

## 2017-01-07 ENCOUNTER — Telehealth: Payer: Self-pay | Admitting: Neurology

## 2017-01-07 NOTE — Telephone Encounter (Signed)
Patient's wife called regarding her husbands disease progressing. She said she is having a hard time transferring him from the bed to the wheelchair or when going to the bathroom. She also said he is having weakness in his arm and eating less. Please Call. Thanks

## 2017-01-07 NOTE — Telephone Encounter (Signed)
I am sorry to hear this.  He was tested for Lyme in September which was negative.  I would recommend repeat NCS/EMG, if he is willing.  If not, the next option is nerve biopsy in the foot.   If his weakness is progressing, he can be admitted for steroids in the hospital.  As we had previously discussed, given the complexity of his condition, seeking a second opinion at an academic center is very reasonable.

## 2017-01-07 NOTE — Telephone Encounter (Signed)
I spoke with patient's wife and informed her that we may need to do EMG but she said that he does not want one.  She asked if it could possibly be lyme disease.  Informed her that I will ask Dr. Posey Pronto and call her back when I have an answer.

## 2017-01-08 NOTE — Telephone Encounter (Signed)
Left message to call me back.

## 2017-01-10 ENCOUNTER — Telehealth: Payer: Self-pay | Admitting: Neurology

## 2017-01-10 NOTE — Telephone Encounter (Signed)
Eduardo Burch is needing a verbal ok to hold off OT for  1 week while he has his Cortizone Infusions next week. Thanks

## 2017-01-10 NOTE — Telephone Encounter (Signed)
Left message informing Sharyn Lull ok to hold off on OT x 1 week.

## 2017-01-14 ENCOUNTER — Telehealth: Payer: Self-pay | Admitting: Neurology

## 2017-01-14 NOTE — Telephone Encounter (Signed)
[place] left a voicemail message asking for some verbal orders CB# [phone]

## 2017-01-14 NOTE — Telephone Encounter (Signed)
Pt's spouse called and said pt is starting steroid injections today and the last day of injections is on Thursday and she wants to know what will happen after he is cut off

## 2017-01-14 NOTE — Telephone Encounter (Signed)
[name] notified that patient will begin monthly infusions after the initial 5.

## 2017-01-14 NOTE — Telephone Encounter (Signed)
Verbal orders given ok to hold therapy until after infusions.

## 2017-01-15 ENCOUNTER — Telehealth: Payer: Self-pay | Admitting: Neurology

## 2017-01-15 NOTE — Telephone Encounter (Signed)
Patient's wife returned your call. Her # [phone]. She received the message left on Onofrio's phone. Please call. Thanks

## 2017-01-15 NOTE — Telephone Encounter (Signed)
I spoke with patient's wife and informed her that Dr. Posey Pronto highly suggests the EMG but we could also do a nerve biopsy or send him for a second opinion.  He had just finished his second round of infusions and she said that she would see of that helps some and try to convince him to have EMG done.

## 2017-01-18 ENCOUNTER — Telehealth: Payer: Self-pay | Admitting: Neurology

## 2017-01-18 ENCOUNTER — Telehealth: Payer: Self-pay | Admitting: *Deleted

## 2017-01-18 NOTE — Telephone Encounter (Signed)
Patient is wanting to hold off therapy 11/19 through 11/24. He will be out of town. Thanks

## 2017-01-18 NOTE — Telephone Encounter (Signed)
Patient's wife called stating that patient is having chest tightness that is progressively getting worse. He is also having numbness in his face and hands.  Patient got on the phone and I instructed him to go to the ER.  Patient refused.  Dr. Posey Pronto aware.

## 2017-01-18 NOTE — Telephone Encounter (Signed)
Message left that it is ok to hold off until patient returns.

## 2017-01-21 ENCOUNTER — Emergency Department (HOSPITAL_COMMUNITY): Payer: Medicare Other

## 2017-01-21 ENCOUNTER — Ambulatory Visit: Payer: Medicare Other | Admitting: Neurology

## 2017-01-21 ENCOUNTER — Encounter (HOSPITAL_COMMUNITY): Payer: Self-pay | Admitting: Emergency Medicine

## 2017-01-21 ENCOUNTER — Emergency Department (HOSPITAL_COMMUNITY)
Admission: EM | Admit: 2017-01-21 | Discharge: 2017-01-21 | Disposition: A | Discharge status: Skilled Nursing Facility | Payer: Medicare Other | Attending: Emergency Medicine | Admitting: Emergency Medicine

## 2017-01-21 ENCOUNTER — Other Ambulatory Visit: Payer: Self-pay

## 2017-01-21 DIAGNOSIS — I10 Essential (primary) hypertension: Secondary | ICD-10-CM | POA: Diagnosis not present

## 2017-01-21 DIAGNOSIS — M6281 Muscle weakness (generalized): Secondary | ICD-10-CM | POA: Diagnosis not present

## 2017-01-21 DIAGNOSIS — Z79899 Other long term (current) drug therapy: Secondary | ICD-10-CM | POA: Diagnosis not present

## 2017-01-21 DIAGNOSIS — Z87891 Personal history of nicotine dependence: Secondary | ICD-10-CM | POA: Insufficient documentation

## 2017-01-21 DIAGNOSIS — Z7982 Long term (current) use of aspirin: Secondary | ICD-10-CM | POA: Diagnosis not present

## 2017-01-21 DIAGNOSIS — R52 Pain, unspecified: Secondary | ICD-10-CM | POA: Diagnosis present

## 2017-01-21 MED ORDER — TIZANIDINE HCL 4 MG PO TABS
2.0000 mg | ORAL_TABLET | Freq: Once | ORAL | Status: AC
  Administered 2017-01-21: 2 mg via ORAL
  Filled 2017-01-21: qty 1

## 2017-01-21 MED ORDER — GADOBENATE DIMEGLUMINE 529 MG/ML IV SOLN
15.0000 mL | Freq: Once | INTRAVENOUS | Status: AC | PRN
  Administered 2017-01-21: 15 mL via INTRAVENOUS

## 2017-01-21 MED ORDER — TRAMADOL HCL 50 MG PO TABS
50.0000 mg | ORAL_TABLET | Freq: Once | ORAL | Status: AC
  Administered 2017-01-21: 50 mg via ORAL
  Filled 2017-01-21: qty 1

## 2017-01-21 NOTE — ED Notes (Signed)
Patient transported to MRI 

## 2017-01-21 NOTE — Progress Notes (Signed)
PT Note:   Noted order for PT consult and chart reviewed. Noted pt recently discharged home from [place] (12/14/2016)  at levels of independent WC propulsion, S to MinA for sliding board transfers and min-ModA for squat pivot transfers, and Total A to MaxA for sit to stand and ambulation trials of 8 feet. Was also using standing frame for standing trials.  Also noted pt awaiting MRI this morning. Will check back with pt in afternoon, to assess pt at bedside as best as possible.   (Good summary of past events in Dr. Serita Grit note from 12/21/2016.)    Clide Dales, PT Pager: [phone] 01/21/2017

## 2017-01-21 NOTE — ED Notes (Signed)
WIFE HAS RECEIVED DISCHARGED PAPERS BEFORE SHE LEFT. WIFE STATED SHE GAVE PT TRAMADOL AT 1500

## 2017-01-21 NOTE — Evaluation (Signed)
Physical Therapy Evaluation Patient Details Name: Eduardo Burch MRN: [id number] DOB: 1941/01/16 Today's Date: 01/21/2017   History of Present Illness  Pt presents by EMS for evaluation of leg pain due to neuropathy that is unrelieved with home medications.  Pt reports pain is in the legs, into back and abdomen. 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 Currently is WC dependent.  Clinical Impression  The patient  Presents with  Decreased strength LLE more so than right. Epic notes indicate that patient was supervision level for sliding board transfers at DC from [facility]. Today , the patient  Requires extensive assistance  For  Bed mobility. Relies on UE  For balance while sitting. Pt admitted with above diagnosis. Pt currently with functional limitations due to the deficits listed below (see PT Problem List). Pt will benefit from skilled PT to increase their independence and safety with mobility to allow discharge to the venue listed below.       Follow Up Recommendations SNF    Equipment Recommendations  None recommended by PT    Recommendations for Other Services       Precautions / Restrictions Precautions Precautions: Fall Precaution Comments: severe weakness of legs. L> R      Mobility  Bed Mobility Overal bed mobility: Needs Assistance Bed Mobility: Supine to Sit;Sit to Supine;Sidelying to Sit     Supine to sit: Max assist;+2 for safety/equipment Sit to supine: +2 for safety/equipment;Total assist   General bed mobility comments: assist legs to and over bed edge which flopped over the edge, assist with trunk to sit upright. Assist with legs and trunk back to side then supine.  Transfers                 General transfer comment: unable due to no WC and height of stretcher  Ambulation/Gait                 Stairs            Wheelchair Mobility    Modified Rankin (Stroke Patients Only)       Balance Overall balance assessment: Needs assistance Sitting-balance support: Feet supported;Bilateral upper extremity supported Sitting balance-Leahy Scale: Poor Sitting balance - Comments: relies on UE support to control balance                                     Pertinent Vitals/Pain Pain Assessment: Faces Faces Pain Scale: Hurts whole lot Pain Location: back and legs Pain Descriptors / Indicators: Aching Pain Intervention(s): Limited activity within patient's tolerance;Monitored during session    Home Living Family/patient expects to be discharged to:: Skilled nursing facility Living Arrangements: Spouse/significant other Available Help at Discharge: Family;Available 24 hours/day Type of Home: House Home Access: Ramped entrance     Home Layout: Two level;Able to live on main level with bedroom/bathroom Home Equipment: Hospital bed;Wheelchair - Dentist Comments: sliding board    Prior Function Level of Independence: Needs assistance   Gait / Transfers Assistance Needed: patient has been using sliding board, WC dependent.            Hand Dominance        Extremity/Trunk Assessment   Upper Extremity Assessment Upper Extremity Assessment: Defer to OT evaluation    Lower Extremity Assessment  Lower Extremity Assessment: RLE deficits/detail;LLE deficits/detail RLE Deficits / Details: grossly 2+ hip flexion, 3 knee extension, 3 dorsiflexion, reports that sensation is WFL LLE Deficits / Details: 1+ knee extension and hip flexion, 2+ dorsiflexion.    Cervical / Trunk Assessment Cervical / Trunk Assessment: Other exceptions Cervical / Trunk Exceptions: relies on UE support  Communication      Cognition Arousal/Alertness: Awake/alert Behavior During Therapy: WFL for tasks assessed/performed;Anxious Overall Cognitive  Status: Within Functional Limits for tasks assessed                                        General Comments      Exercises     Assessment/Plan    PT Assessment Patient needs continued PT services  PT Problem List Decreased strength;Decreased activity tolerance;Decreased balance;Decreased mobility;Decreased coordination;Decreased safety awareness;Decreased knowledge of precautions;Decreased knowledge of use of DME;Pain       PT Treatment Interventions DME instruction;Functional mobility training;Therapeutic activities;Neuromuscular re-education;Balance training;Therapeutic exercise    PT Goals (Current goals can be found in the Care Plan section)  Acute Rehab PT Goals Patient Stated Goal: to go to the gym again PT Goal Formulation: With patient Time For Goal Achievement: 02/04/17 Potential to Achieve Goals: Fair    Frequency Min 2X/week   Barriers to discharge Decreased caregiver support      Co-evaluation               AM-PAC PT "6 Clicks" Daily Activity  Outcome Measure Difficulty turning over in bed (including adjusting bedclothes, sheets and blankets)?: Unable Difficulty moving from lying on back to sitting on the side of the bed? : Unable Difficulty sitting down on and standing up from a chair with arms (e.g., wheelchair, bedside commode, etc,.)?: Unable Help needed moving to and from a bed to chair (including a wheelchair)?: Total Help needed walking in hospital room?: Total Help needed climbing 3-5 steps with a railing? : Total 6 Click Score: 6    End of Session   Activity Tolerance: Patient tolerated treatment well Patient left: in bed;with call bell/phone within reach Nurse Communication: Mobility status PT Visit Diagnosis: Muscle weakness (generalized) (M62.81);Other symptoms and signs involving the nervous system (R29.898)    Time: 9628-3662 PT Time Calculation (min) (ACUTE ONLY): 13 min   Charges:   PT Evaluation $PT Eval Low  Complexity: 1 Low     PT G Codes:   PT G-Codes **NOT FOR INPATIENT CLASS** Functional Assessment Tool Used: Clinical judgement;AM-PAC 6 Clicks Basic Mobility Functional Limitation: Mobility: Walking and moving around Mobility: Walking and Moving Around Current Status (H4765): 100 percent impaired, limited or restricted Mobility: Walking and Moving Around Goal Status (Y6503): At least 20 percent but less than 40 percent impaired, limited or restricted    [facility] PT [phone]   Claretha Cooper 01/21/2017, 1:52 PM

## 2017-01-21 NOTE — ED Notes (Signed)
PT MADE AWARE OF MRI STATUS. DIET ORDERED

## 2017-01-21 NOTE — NC FL2 (Signed)
[place] LEVEL OF CARE SCREENING TOOL     IDENTIFICATION  Patient Name: Eduardo Burch Birthdate: 1940/07/14 Sex: male Admission Date (Current Location): 01/21/2017  [facility] and [place] Number:  Herbalist and Address:  [facility],  [place] [address], [place]      Provider Number: [id number]  Attending Physician Name and Address:  Virgel Manifold, MD  Relative Name and Phone Number:       Current Level of Care: Hospital Recommended Level of Care: [place] Prior Approval Number:    Date Approved/Denied:   PASRR Number:   [id number]   Discharge Plan: Hospital    Current Diagnoses: Patient Active Problem List   Diagnosis Date Noted  . Cardiac calcification ([place]) 12/26/2016  . Ankle pain 11/19/2016  . Hyperbilirubinemia 11/17/2016  . Leukocytosis 11/14/2016  . Hypophosphatemia 11/14/2016  . Dysphagia 11/14/2016  . Abnormal LFTs 11/14/2016  . Lower extremity weakness 11/13/2016  . 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 Back pain without sciatica 10/25/2016  . 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 Hyperlipidemia 10/03/2016  . H/O prostate cancer 10/03/2016  . Hyponatremia 10/03/2016  . Leg weakness 09/28/2016  . Constipation   . Tachycardia 08/16/2016  . Hypertension, essential 08/15/2016    Orientation RESPIRATION BLADDER Height & Weight     Self, Time, Situation, Place  Normal Continent Weight: 166 lb (75.3 kg) Height:  6\' 1"  (185.4 cm)  BEHAVIORAL SYMPTOMS/MOOD NEUROLOGICAL BOWEL NUTRITION STATUS        Diet(Regular)  AMBULATORY STATUS COMMUNICATION OF NEEDS Skin     Verbally Normal                       Personal Care Assistance Level of Assistance  Bathing, Feeding, Dressing           Functional Limitations Info             SPECIAL CARE FACTORS FREQUENCY  PT (By licensed PT), OT (By licensed OT)     PT Frequency: 5 OT Frequency: 5             Contractures      Additional Factors Info  Code Status, Allergies Code Status Info: Prior Allergies Info: REMICADE INFLIXIMAB            Current Medications (01/21/2017):  This is the current hospital active medication list Current Facility-Administered Medications  Medication Dose Route Frequency Provider Last Rate Last Dose  . methylPREDNISolone sodium succinate (SOLU-MEDROL) 1,000 mg in sodium chloride 0.9 % 50 mL IVPB  1,000 mg Intravenous Daily Narda Amber K, DO       Current Outpatient Medications  Medication Sig Dispense Refill  . acetaminophen (TYLENOL) 500 MG tablet Take 500-1,000 mg by mouth every 6 (six) hours as needed for headache.    Marland Kitchen aspirin 81 MG chewable tablet Chew 1 tablet (81 mg total) by mouth daily. 30 tablet 0  . bisacodyl (DULCOLAX) 5 MG EC tablet Take 1 tablet (5 mg total) by mouth daily as needed for moderate constipation. (Patient taking differently: Take 5 mg daily as needed by mouth for mild constipation. ) 30 tablet 0  . docusate sodium (COLACE) 100 MG capsule Take 1 capsule (100 mg total) by mouth every 12 (twelve) hours. (Patient taking differently: Take 100 mg by mouth every other day. ) 60 capsule 0  . gabapentin (NEURONTIN) 300 MG capsule Take 1 capsule (300 mg total) by mouth at bedtime.  30 capsule 0  . tetrahydrozoline (VISINE) 0.05 % ophthalmic solution Place 1-2 drops 2 (two) times daily as needed into both eyes (dry eyes.).    Marland Kitchen tiZANidine (ZANAFLEX) 2 MG tablet Take 1 tablet (2 mg total) by mouth 3 (three) times daily. (Patient taking differently: Take 2 mg 2 (two) times daily by mouth. ) 30 tablet 5  . traMADol (ULTRAM) 50 MG tablet Take 1 tablet (50 mg total) by mouth every 6 (six) hours as needed for moderate pain or severe pain. 30 tablet 0  . traZODone (DESYREL) 50 MG tablet Take 1 tablet (50 mg total) by mouth at bedtime. (Patient taking differently: Take 50 mg at bedtime as needed by mouth for sleep. ) 30 tablet 0  .  Zoledronic Acid (RECLAST IV) Inject into the vein See admin instructions. Yearly    . cyclobenzaprine (FLEXERIL) 5 MG tablet Take 1 tablet (5 mg total) by mouth 3 (three) times daily as needed for muscle spasms. (Patient not taking: Reported on 12/21/2016) 30 tablet 0  . methylPREDNISolone sodium succinate (SOLU-MEDROL) 1000 MG injection Inject 1,000 mg daily into the vein. x5 days       Discharge Medications: Please see discharge summary for a list of discharge medications.  Relevant Imaging Results:  Relevant Lab Results:   Additional Information [id number]  Wendelyn Breslow, LCSW

## 2017-01-21 NOTE — ED Provider Notes (Addendum)
[facility] DEPT Provider Note   CSN: [id number] Arrival date & time: 01/21/17  0609     History   Chief Complaint Chief Complaint  Patient presents with  . Leg Pain    HPI Eduardo Burch is a 76 y.o. male.  HPI   Eduardo Burch is a 76 y.o. male, with a history of HTN and peripheral polyneuropathy, presenting to the ED with torso pain extending into the bilateral legs. Pain "feels like a tightness in the back and chest muscles," 4/10, similar tightness present in the bilateral lower extremities.  Presents to the ED today due to worsening pain for the last 3 days. Now has bilateral upper extremity weakness over last week. States a regimen of 50mg  tramadol and 2 mg tizanidine usually helps his symptoms.  Denies shortness of breath, diaphoresis, point chest pain, fever/chills, N/V/D, choking, swallow dysfunction, headache, falls, numbness, abdominal pain, or any other complaints.  Abbreviated timeline patient's diagnoses: 09/20/16: Back pain with leg weakness 09/24/16: MRI lumbar spine only showing possible Paget's disease 09/28/16: CSF protein 215. Diagnosed with GBS 10/05/16: EMG through Dr. Warnell Forester with [place] neurology confirms not GBS 11/12/16: Dr. Posey Pronto, neurologist, then thought it was CIDP, received plasma transfusions, which helped and allowed patient more mobility.  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 Just finished daily infusions of 5000mg  solumedrol.     Past Medical History:  Diagnosis Date  . Constipated   . Elevated blood pressure reading   . 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 Hypercholesterolemia   . Hypertension   . Spondylosis     Patient Active Problem List   Diagnosis Date Noted  . Cardiac calcification ([place]) 12/26/2016  . Ankle pain 11/19/2016  . Hyperbilirubinemia 11/17/2016  . Leukocytosis 11/14/2016  . Hypophosphatemia 11/14/2016  . Dysphagia 11/14/2016  . Abnormal LFTs  11/14/2016  . Lower extremity weakness 11/13/2016  . 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 Back pain without sciatica 10/25/2016  . 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 Hyperlipidemia 10/03/2016  . H/O prostate cancer 10/03/2016  . Hyponatremia 10/03/2016  . Leg weakness 09/28/2016  . Constipation   . Tachycardia 08/16/2016  . Hypertension, essential 08/15/2016    Past Surgical History:  Procedure Laterality Date  . MUSCLE BIOPSY Left 12/13/2016   Procedure: LEFT VASTUS LATERAILS MUSCLE BIOPSY;  Surgeon: Ralene Ok, MD;  Location: [facility];  Service: General;  Laterality: Left;  . PROATATECTOMY         Home Medications    Prior to Admission medications   Medication Sig Start Date End Date Taking? Authorizing Provider  acetaminophen (TYLENOL) 500 MG tablet Take 500-1,000 mg by mouth every 6 (six) hours as needed for headache.   Yes [provider]  aspirin 81 MG chewable tablet Chew 1 tablet (81 mg total) by mouth daily. 10/04/16  Yes Sheikh, Omair Latif, DO  bisacodyl (DULCOLAX) 5 MG EC tablet Take 1 tablet (5 mg total) by mouth daily as needed for moderate constipation. Patient taking differently: Take 5 mg daily as needed by mouth for mild constipation.  11/23/16  Yes Georgette Shell, MD  docusate sodium (COLACE) 100 MG capsule Take 1 capsule (100 mg total) by mouth every 12 (twelve) hours. Patient taking differently: Take 100 mg by mouth every other day.  08/11/16  Yes Ward, Delice Bison, DO  gabapentin (NEURONTIN) 300 MG capsule Take 1 capsule (300 mg total) by mouth at bedtime. 12/14/16  Yes Angiulli, Lavon Paganini, PA-C  tetrahydrozoline (VISINE) 0.05 % ophthalmic solution Place 1-2 drops 2 (two) times daily as needed into both eyes (dry eyes.).   Yes [provider]  tiZANidine (ZANAFLEX) 2 MG tablet Take 1 tablet (2 mg total) by mouth 3 (three) times daily. Patient taking differently: Take 2 mg 2 (two) times daily by  mouth.  12/14/16  Yes Angiulli, Lavon Paganini, PA-C  traMADol (ULTRAM) 50 MG tablet Take 1 tablet (50 mg total) by mouth every 6 (six) hours as needed for moderate pain or severe pain. 12/14/16  Yes Angiulli, Lavon Paganini, PA-C  traZODone (DESYREL) 50 MG tablet Take 1 tablet (50 mg total) by mouth at bedtime. Patient taking differently: Take 50 mg at bedtime as needed by mouth for sleep.  12/14/16  Yes Angiulli, Lavon Paganini, PA-C  Zoledronic Acid (RECLAST IV) Inject into the vein See admin instructions. Yearly   Yes [provider]  cyclobenzaprine (FLEXERIL) 5 MG tablet Take 1 tablet (5 mg total) by mouth 3 (three) times daily as needed for muscle spasms. Patient not taking: Reported on 12/21/2016 12/14/16   Angiulli, Lavon Paganini, PA-C  methylPREDNISolone sodium succinate (SOLU-MEDROL) 1000 MG injection Inject 1,000 mg daily into the vein. x5 days    [provider]    Family History Family History  Problem Relation Age of Onset  . Lung cancer Mother   . Spondylitis Father     Social History Social History   Tobacco Use  . Smoking status: Former Smoker    Types: Cigarettes  . Smokeless tobacco: Never Used  Substance Use Topics  . Alcohol use: No  . Drug use: No     Allergies   Remicade [infliximab]   Review of Systems Review of Systems  Constitutional: Negative for chills, diaphoresis and fever.  Respiratory: Negative for cough and shortness of breath.   Gastrointestinal: Negative for abdominal pain, diarrhea, nausea and vomiting.  Musculoskeletal: Positive for myalgias.       "band-like" pain around chest and back  Neurological: Positive for weakness (bilateral upper extremities). Negative for numbness and headaches.  All other systems reviewed and are negative.    Physical Exam Updated Vital Signs BP (!) 149/92 (BP Location: Left Arm)   Pulse (!) 102   Temp 97.8 F (36.6 C) (Oral)   Resp 16   Ht 6\' 1"  (1.854 m)   Wt 75.3 kg (166 lb)   SpO2 95%   BMI 21.90  kg/m   Physical Exam  Constitutional: He is oriented to person, place, and time. He appears well-developed and well-nourished. No distress.  HENT:  Head: Normocephalic and atraumatic.  Mouth/Throat: Oropharynx is clear and moist.  Eyes: Conjunctivae and EOM are normal. Pupils are equal, round, and reactive to light.  Neck: Normal range of motion. Neck supple.  Cardiovascular: Normal rate, regular rhythm, normal heart sounds and intact distal pulses.  Pulmonary/Chest: Effort normal and breath sounds normal. No respiratory distress.  Equal chest rise and fall bilaterally.  No noted increased work of breathing.  Patient speaks in full sentences without difficulty.  Abdominal: Soft. There is no tenderness. There is no guarding.  Musculoskeletal: He exhibits no edema.  Lymphadenopathy:    He has no cervical adenopathy.  Neurological: He is alert and oriented to person, place, and time.  No noted sensory deficits. No noted speech deficit or aphasia. No apparent swallow dysfunction.  Patient handles oral secretions without difficulty. Grip strength equal bilaterally. 5/5 strength with abduction and adduction  of the bilateral shoulders, 5/5 strength flexion and extension of bilateral elbows, 5/5 strength in the cardinal directions of the bilateral wrists. 1/5 strength in bilateral hips, 2/5 in bilateral knees, 5/5 in bilateral ankles. Patient states this lower extremity weakness is not new for him. Patient does seem to have weakness in the core muscles.  He is unable to sit upright without supporting himself with his arms.  He states this has been the case for many weeks.  Skin: Skin is warm and dry. Capillary refill takes less than 2 seconds. He is not diaphoretic.  Psychiatric: He has a normal mood and affect. His behavior is normal.  Nursing note and vitals reviewed.    ED Treatments / Results  Labs (all labs ordered are listed, but only abnormal results are displayed) Labs Reviewed - No  data to display  EKG  EKG Interpretation None       Radiology Mr Thoracic Spine W Wo Contrast  Result Date: 01/21/2017 CLINICAL DATA:  Bilateral lower extremity pain for several months which has worsened over the past 3 days. History of prostate carcinoma. EXAM: MRI THORACIC WITHOUT AND WITH CONTRAST TECHNIQUE: Multiplanar and multiecho pulse sequences of the thoracic spine were obtained without and with intravenous contrast. CONTRAST:  15 ml MULTIHANCE GADOBENATE DIMEGLUMINE 529 MG/ML IV SOLN COMPARISON:  MRI thoracic spine 10/02/2016. CT chest, abdomen and pelvis 11/20/2016. FINDINGS: MRI THORACIC SPINE FINDINGS Alignment:  Maintained. Vertebrae: Mild loss of vertebral body height at T7 and T9 is unchanged. Multifocal enhancing lesions consistent with metastatic prostate carcinoma are again identified. Largest deposits are in T2, T8 and T11. The appearance is not markedly changed. Cord:  Normal signal throughout. Paraspinal and other soft tissues: Small bilateral pleural effusions are noted, larger on the left. Disc levels: Mild degenerative disc disease most notable at T6-7 there is a shallow right paracentral protrusion is unchanged. IMPRESSION: No change in the appearance of the thoracic spine since 10/02/2016. Scattered metastatic deposits in bone are again seen. The thoracic cord appears normal. There is mild spondylosis without central canal narrowing. Small bilateral pleural effusions, larger on the left. Electronically Signed   By: Inge Rise M.D.   On: 01/21/2017 14:43    Procedures Procedures (including critical care time)  Medications Ordered in ED Medications  traMADol (ULTRAM) tablet 50 mg (50 mg Oral Given 01/21/17 0745)  tiZANidine (ZANAFLEX) tablet 2 mg (2 mg Oral Given 01/21/17 0745)  gadobenate dimeglumine (MULTIHANCE) injection 15 mL (15 mLs Intravenous Contrast Given 01/21/17 1422)     Initial Impression / Assessment and Plan / ED Course  I have reviewed the  triage vital signs and the nursing notes.  Pertinent labs & imaging results that were available during my care of the patient were reviewed by me and considered in my medical decision making (see chart for details).  Clinical Course as of Jan 24 1199  Mon Jan 21, 2017  [facility]. Spoke with Caryl Pina, LPN. States she will speak with Dr. Posey Pronto and have her call me back.  [SJ]  0820 Reassessed patient. States his pain has improved.  [SJ]  [id number] Spoke with Dr. Thresa Ross Neurology. States patient has had progressive muscle weakness and neuropathy.  Completely normal functioning before June. Due to the bandlike tightness in the torso, she recommends thoracic MRI with and without contrast here in the ED. Her goals for further evaluation are as follows:1.  Repeat EMG.  Patient apparently had a bad experience with this at [facility] and  has been resistant to repeating this test.2.  Nerve biopsy.  Patient has had a muscle biopsy, however, a nerve biopsy would be higher yield and the patient has been previously resistant to this. 3. Academic center referral. [facility]? 4. Repeat lumbar puncture for protein assessment. 5. Will need repeat PET scan. His weight loss suggests neoplasm. 6. May need to be evaluated for facility placement. Check with patient regarding comfort at home.  Follow up with Dr. Posey Pronto in the office - send her a message  [SJ]  0900 Spoke with patient about Dr. Serita Grit suggestions. He agrees to the MRI today.  I spoke with him about a mindset change to his care. He admits to having the mindset of "let's do something to fix me." I spoke with him about extra question marks this adds to his evaluation when he refuses trended testing.  He states he will go through with any testing that is recommended stating he now understandings that repeat evaluation needs to be done to try to come to a diagnosis.  He agrees to evaluation for facility placement. His wife states she can no longer  safely care for him at home.  [SJ]    Clinical Course User Index [SJ] Ricahrd Schwager C, PA-C    Patient presents with what appears to be progression of muscle weakness in an ascending fashion over the course of the last several months.  He is in close contact with his neurologist.  His new weakness over the last week was discussed with his neurologist while the patient was in the ED.  Suggestions were carried out.  Thoracic MRI results appear to be similar to previous results. My suspicion for cardiac or pulmonary etiology is low.  Patient's discomfort resolved shortly into the ED course and did not return.  Findings and plan of care discussed with Virgel Manifold, MD.   Vitals:   01/21/17 1203 01/21/17 1644 01/21/17 1714 01/21/17 1815  BP:  (!) 153/100 (!) 154/102 (!) 158/102  Pulse:  98 97 98  Resp:  17 16 16   Temp: 98 F (36.7 C)  97.8 F (36.6 C)   TempSrc:   Oral Oral  SpO2:  97% 98% 98%  Weight:      Height:         Final Clinical Impressions(s) / ED Diagnoses   Final diagnoses:  Muscle weakness    ED Discharge Orders    None       Layla Maw 01/23/17 1209    Virgel Manifold, MD 01/24/17 0701  Addendum: No lab work obtained during patient encounter due to patient stating upon initial interview, "I don't want any lab work done. I've had enough of that done." Patient maintained this despite explanation of proposed labs.    Lorayne Bender, PA-C 01/28/17 0612    Virgel Manifold, MD 01/28/17 [phone]

## 2017-01-21 NOTE — ED Notes (Signed)
Bed: HQ46 Expected date:  Expected time:  Means of arrival:  Comments: 56 m leg pain

## 2017-01-21 NOTE — ED Notes (Signed)
WIFE CALLED AND MADE AWARE OF HUSBAND DISCHARGED WITH [facility]. PT MADE AWARE OF WIFE'S NOTIFICATION.

## 2017-01-21 NOTE — ED Notes (Addendum)
PHYSICAL THERAPY HERE TO EVALUATE PT. AWARE OF MRI PENDING, SOCIAL WORK AND PT'S PLACEMENT

## 2017-01-21 NOTE — Clinical Social Work Note (Signed)
Clinical Social Work Assessment  Patient Details  Name: Eduardo Burch MRN: [id number] Date of Birth: 1940/08/04  Date of referral:  01/21/17               Reason for consult:  Facility Placement                Permission sought to share information with:  Chartered certified accountant granted to share information::  Yes, Verbal Permission Granted  Name::        Agency::     Relationship::     Contact Information:     Housing/Transportation Living arrangements for the past 2 months:  Single Family Home Source of Information:  Patient, Spouse Patient Interpreter Needed:  None Criminal Activity/Legal Involvement Pertinent to Current Situation/Hospitalization:    Significant Relationships:  Significant Other Lives with:  Spouse Do you feel safe going back to the place where you live?  No Need for family participation in patient care:  Yes (Comment)  Care giving concerns:  Pt expressed concerns about becoming too much for his wife to care for. Wife shared those concerns as well. Pt and wife shared that his ability to move around and complete daily tasks is decreasing and now impacting his upper body mobility. Family is requesting placement from ED.     Social Worker assessment / plan:  CSW was consulted for SNF placement. CSW explained to pt and pt's wife the process for referral to SNF placements. Wife expressed concerns about where the client is placed. Wife is uncertain about the SNF placements available in the area. CSW stated that once the referral for SNF placements are sent out and locations have accepted the Pt, the wife can research those locations. CSW will follow up with Pt and family about SNF placements.   Employment status:  Retired Nurse, adult PT Recommendations:    Information / Referral to community resources:  [place]  Patient/Family's Response to care: Pt and Pt's family were receptive to the care plan.    Patient/Family's Understanding of and Emotional Response to Diagnosis, Current Treatment, and Prognosis:  Pt and family understand the client's current situation and treatment plan.   Emotional Assessment Appearance:  Appears stated age Attitude/Demeanor/Rapport:    Affect (typically observed):  Accepting, Stable, Appropriate Orientation:  Oriented to Self, Oriented to Situation, Oriented to Place, Oriented to  Time Alcohol / Substance use:    Psych involvement (Current and /or in the community):  No (Comment)  Discharge Needs  Concerns to be addressed:  No discharge needs identified Readmission within the last 30 days:  No Current discharge risk:  None Barriers to Discharge:  No Barriers Identified   Wendelyn Breslow, LCSW 01/21/2017, 12:11 PM

## 2017-01-21 NOTE — Progress Notes (Addendum)
3:40 PM: Pt medically cleared. Pt offered bed at [facility] and Pt accepted bed. Pt set for discharge on 01/21/17 to [facility]. PTAR to [facility] will be set up. CSW faxed AVS to [facility] at [phone]. CSW communicated with Irine Seal from [facility] at [phone]. Report number is [id number] and ask for [facility].   2:40 PM : CSW reviewed SNF placements that have accepted the Pt with the Pt's wife. Pt's wife expressed interest in Pt going to [facility]. CSW called and left voice message for [facility] to check to see if FL2 has been received. CSW waiting for return phone call.   Wendelyn Breslow, [facility]  [phone]

## 2017-01-21 NOTE — Progress Notes (Signed)
CSW received a call from [facility] has been accepted by: [facility] Number for report is: [phone] Pt's unit/room/bed number will be: [facility] (Room TBD) Accepting physician: SNF MD   Pt can arrive ASAP on 01/21/2017   CSW will update RN and EDP.  Alphonse Guild. R[place], LCSW, LCAS, CSI Clinical Social Worker Ph: [phone]

## 2017-01-21 NOTE — Discharge Instructions (Addendum)
There were no acute changes on the MRI today. Please follow-up with Dr. Posey Pronto as soon as possible. Plans include repeat EMG, nerve biopsy, repeat lumbar puncture, repeat PET scan, and referral to neurology at an academic center. Return to the ED for difficulty breathing, difficulty swallowing, severely increased pain, confusion, or other major concerns.

## 2017-01-21 NOTE — ED Notes (Signed)
EDPA Provider at bedside. 

## 2017-01-21 NOTE — ED Triage Notes (Addendum)
Pt presents by EMS for evaluation of leg pain due to neuropathy that is unrelieved with home medications. EMS reports pt took gabapentin and tramadol. Pt reports pain has went from legs into back and abdomen. Pt states pain has been ongoing for the last several months worsening over the last 3 days.

## 2017-01-23 ENCOUNTER — Telehealth: Payer: Self-pay | Admitting: *Deleted

## 2017-01-23 ENCOUNTER — Inpatient Hospital Stay: Payer: Medicare Other | Admitting: Physical Medicine & Rehabilitation

## 2017-01-23 NOTE — ED Provider Notes (Signed)
  Upon further review of this patient's chart, it was noted the impressions from multiple radiologists mention the possibility of metastatic disease, but state other explanations are more likely.  In the interest of thoroughness, I sent a message to Curt Bears, MD with oncology to ask for his opinion on the matter.  I also sent a message to the patient's PCP to assure patient has someone following him through this matter.    Lorayne Bender, PA-C 01/23/17 1250    Virgel Manifold, MD 01/24/17 0700

## 2017-01-23 NOTE — Telephone Encounter (Signed)
Opened in error

## 2017-01-23 NOTE — Telephone Encounter (Signed)
Rhonda (DON at [facility]) called to ask about the solumedrol infusions.  Called her back and left a message that the infusions have been done but that she needs to call Dr. Melissa Noon office about the Reclast.

## 2017-01-28 ENCOUNTER — Telehealth: Payer: Self-pay | Admitting: Neurology

## 2017-01-28 NOTE — Telephone Encounter (Signed)
Patient is having problems with Hands but is seeing some movement in leg. Patient does not want anymore test

## 2017-01-28 NOTE — Telephone Encounter (Signed)
I spoke with patient's wife and she said that she will keep the Dec. 10 follow up but patient does not want anymore testing done.

## 2017-01-28 NOTE — ED Provider Notes (Signed)
  01/28/17 10:05 AM Dr. Julien Nordmann, oncologist, recommends patient have referral to [facility] oncologist, Dr. Alen Blew.  Called patient to notify him of this. States he will also discuss it with his wife. Requests I call back in two days.   10:40 AM Spoke with Seth Bake with oncology scheduling office. States she will contact Dr. Alen Blew to see if he will see the patient based on the information available. 11:05 AM Seth Bake states Dr. Alen Blew has agreed to see the patient. Appointment set up for 2pm on Friday, November 30. Patient will need to arrive by 1:30PM.      Lorayne Bender, PA-C 01/28/17 1515    Virgel Manifold, MD 01/29/17 1000

## 2017-01-28 NOTE — Telephone Encounter (Signed)
Patient's wife called and would like you to please call her. She cancelled his Follow Up appt that was scheduled for tomorrow 01/29/17. Please Call. Thanks

## 2017-01-29 ENCOUNTER — Ambulatory Visit: Payer: Medicare Other | Admitting: Neurology

## 2017-01-31 ENCOUNTER — Ambulatory Visit: Payer: Medicare Other | Admitting: Physical Therapy

## 2017-01-31 ENCOUNTER — Telehealth: Payer: Self-pay | Admitting: *Deleted

## 2017-01-31 NOTE — Telephone Encounter (Signed)
"  This is Public relations account executive and transport with [facility].  Just learned one of our patients has been scheduled at your office.  Calling to confirm appointment information."  New patient visit 02-01-2017 at 2:00 pm.  Instructed to arrive at 1:30 pm for registration.  No further questions.

## 2017-02-01 ENCOUNTER — Ambulatory Visit: Payer: Medicare Other | Admitting: Oncology

## 2017-02-01 VITALS — BP 97/71 | HR 114 | Resp 18 | Ht 73.0 in

## 2017-02-01 DIAGNOSIS — C61 Malignant neoplasm of prostate: Secondary | ICD-10-CM

## 2017-02-01 DIAGNOSIS — M6281 Muscle weakness (generalized): Secondary | ICD-10-CM

## 2017-02-01 DIAGNOSIS — Z8546 Personal history of malignant neoplasm of prostate: Secondary | ICD-10-CM | POA: Diagnosis not present

## 2017-02-01 DIAGNOSIS — G629 Polyneuropathy, unspecified: Secondary | ICD-10-CM | POA: Diagnosis not present

## 2017-02-01 NOTE — Progress Notes (Signed)
Reason for Referral: Prostate cancer.  HPI: 76 year old gentleman with history of prostate cancer diagnosed in 2010.  At that time he had a radical prostatectomy performed by Dr. Alinda Money and he remained disease free since that time.  He was in his usual state of health until he presented in September 2018 with bilateral lower extremity weakness that has been progressing for the last 4 months.  He had an extensive workup that included imaging studies such as MRI, PET scan as well as multiple biopsies including nerve and muscle biopsy.  The etiology of all his neurological symptoms are is unclear.  He has polyneuropathy and currently resides in a skilled nursing facility and getting physical therapy.  He has received IVIG and plasmapheresis without improvement previously.  His imaging studies including MRI of the lumbar spine and the thoracic spine in July 2018 showed some osseous changes suggestive of Paget's disease.  Bone scan June 2018 showed asymmetric uptake in the posterior left pelvis, L5 and S1 compatible with Paget's disease.  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  He was evaluated by oncology at [facility] and felt that these findings are less likely malignancy and observation is recommended at the time.  Repeat MRI obtained on January 21, 2017 showed no changes compared to previous MRIs.  The report did indicate that these lesions are metastatic deposits although it was clearly indicated that was not the case on previous MRI.  His PSA in September 2018 was undetectable.  Clinically, he remains quite debilitated and spends the majority of this day in a chair.  He needs assistance to stand up although feels physically better after he has been participating in physical therapy.  He denies any increased back pain or pelvic pain as of late.  He denies any headaches, blurry  vision or syncope.  He does not report any fevers or chills or sweats.  He does not report any cough, wheezing or hemoptysis.  He does not report any nausea, vomiting or abdominal pain.  He does not up any frequency urgency or hesitancy.  Remaining review of systems unremarkable.  Past Medical History:  Diagnosis Date  . Constipated   . Elevated blood pressure reading   . 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 Hypercholesterolemia   . Hypertension   . Spondylosis   :  Past Surgical History:  Procedure Laterality Date  . MUSCLE BIOPSY Left 12/13/2016   Procedure: LEFT VASTUS LATERAILS MUSCLE BIOPSY;  Surgeon: Ralene Ok, MD;  Location: [facility];  Service: General;  Laterality: Left;  . PROATATECTOMY    :   Current Outpatient Medications:  .  acetaminophen (TYLENOL) 500 MG tablet, Take 500-1,000 mg by mouth every 6 (six) hours as needed for headache., Disp: , Rfl:  .  aspirin 81 MG chewable tablet, Chew 1 tablet (81 mg total) by mouth daily., Disp: 30 tablet, Rfl: 0 .  bisacodyl (DULCOLAX) 5 MG EC tablet, Take 1 tablet (5 mg total) by mouth daily as needed for moderate constipation. (Patient taking differently: Take 5 mg daily as needed by mouth for mild constipation. ), Disp: 30 tablet, Rfl: 0 .  cyclobenzaprine (FLEXERIL) 5 MG tablet, Take 1 tablet (5 mg total) by mouth 3 (three) times daily as needed for muscle spasms. (Patient not taking: Reported on 12/21/2016), Disp: 30 tablet, Rfl: 0 .  docusate sodium (COLACE) 100 MG capsule, Take 1 capsule (100 mg total) by mouth every 12 (twelve) hours. (Patient taking differently: Take 100 mg by mouth every other day. ), Disp: 60 capsule, Rfl: 0 .  gabapentin (NEURONTIN) 300 MG capsule, Take 1 capsule (300 mg total) by mouth at bedtime., Disp: 30 capsule, Rfl: 0 .  methylPREDNISolone sodium succinate (SOLU-MEDROL) 1000 MG injection, Inject 1,000 mg daily into the vein. x5 days, Disp: , Rfl:  .  tetrahydrozoline (VISINE)  0.05 % ophthalmic solution, Place 1-2 drops 2 (two) times daily as needed into both eyes (dry eyes.)., Disp: , Rfl:  .  tiZANidine (ZANAFLEX) 2 MG tablet, Take 1 tablet (2 mg total) by mouth 3 (three) times daily. (Patient taking differently: Take 2 mg 2 (two) times daily by mouth. ), Disp: 30 tablet, Rfl: 5 .  traMADol (ULTRAM) 50 MG tablet, Take 1 tablet (50 mg total) by mouth every 6 (six) hours as needed for moderate pain or severe pain., Disp: 30 tablet, Rfl: 0 .  traZODone (DESYREL) 50 MG tablet, Take 1 tablet (50 mg total) by mouth at bedtime. (Patient taking differently: Take 50 mg at bedtime as needed by mouth for sleep. ), Disp: 30 tablet, Rfl: 0 .  Zoledronic Acid (RECLAST IV), Inject into the vein See admin instructions. Yearly, Disp: , Rfl:   Current Facility-Administered Medications:  .  methylPREDNISolone sodium succinate (SOLU-MEDROL) 1,000 mg in sodium chloride 0.9 % 50 mL IVPB, 1,000 mg, Intravenous, Daily, Posey Pronto, Donika K, DO:  Allergies  Allergen Reactions  . Remicade [Infliximab] Anaphylaxis  :  Family History  Problem Relation Age of Onset  . Lung cancer Mother   . Spondylitis Father   :  Social History   Socioeconomic History  . Marital status: Married    Spouse name: Not on file  . Number of children: Not on file  . Years of education: 52  . Highest education level: Not on file  Social Needs  . Financial resource strain: Not on file  . Food insecurity - worry: Not on file  . Food insecurity - inability: Not on file  . Transportation needs - medical: Not on file  . Transportation needs - non-medical: Not on file  Occupational History  . Occupation: retired from [employer]  Tobacco Use  . Smoking status: Former Smoker    Types: Cigarettes  . Smokeless tobacco: Never Used  Substance and Sexual Activity  . Alcohol use: No  . Drug use: No  . Sexual activity: Not on file  Other Topics Concern  . Not on file  Social History Narrative   Lives with wife in a 2  story home.  Has one son.  Retired from [employer].  Education: college.   :  Pertinent items are noted in HPI.  Exam: Blood pressure 97/71, pulse (!) 114, resp. rate 18, height 6\' 1"  (1.854 m), SpO2 99 %. General appearance: alert and cooperative Throat: lips, mucosa, and tongue normal; teeth and gums normal Neck: no adenopathy Back: negative Resp: clear to auscultation bilaterally Chest wall: no tenderness Cardio: regular rate and rhythm, S1, S2 normal, no murmur, click, rub or gallop GI: soft, non-tender; bowel sounds normal; no masses,  no organomegaly Extremities: extremities normal, atraumatic, no cyanosis or edema Pulses: 2+ and symmetric Skin: Skin color, texture, turgor normal. No rashes or lesions Lymph nodes: Cervical, supraclavicular, and axillary nodes normal.  CBC    Component Value Date/Time   WBC 7.7 12/10/2016 0926   RBC 4.37 12/10/2016  0926   HGB 14.3 12/10/2016 0926   HCT 42.9 12/10/2016 0926   PLT 449 (H) 12/10/2016 0926   MCV 98.2 12/10/2016 0926   MCH 32.7 12/10/2016 0926   MCHC 33.3 12/10/2016 0926   RDW 14.2 12/10/2016 0926   LYMPHSABS 1.8 12/10/2016 0926   MONOABS 0.6 12/10/2016 0926   EOSABS 0.1 12/10/2016 0926   BASOSABS 0.0 12/10/2016 0926     Chemistry      Component Value Date/Time   NA 135 12/10/2016 0926   NA 139 08/16/2016 1106   K 4.7 12/10/2016 0926   CL 102 12/10/2016 0926   CO2 26 12/10/2016 0926   BUN 17 12/10/2016 0926   BUN 20 08/16/2016 1106   CREATININE 0.50 (L) 12/10/2016 0926      Component Value Date/Time   CALCIUM 8.3 (L) 12/10/2016 0926   ALKPHOS 64 12/10/2016 0926   AST 22 12/10/2016 0926   ALT 18 12/10/2016 0926   BILITOT 0.3 12/10/2016 0926   BILITOT 0.7 08/16/2016 1106       Mr Thoracic Spine W Wo Contrast  Result Date: 01/21/2017 CLINICAL DATA:  Bilateral lower extremity pain for several months which has worsened over the past 3 days. History of prostate carcinoma. EXAM: MRI THORACIC WITHOUT AND WITH  CONTRAST TECHNIQUE: Multiplanar and multiecho pulse sequences of the thoracic spine were obtained without and with intravenous contrast. CONTRAST:  15 ml MULTIHANCE GADOBENATE DIMEGLUMINE 529 MG/ML IV SOLN COMPARISON:  MRI thoracic spine 10/02/2016. CT chest, abdomen and pelvis 11/20/2016. FINDINGS: MRI THORACIC SPINE FINDINGS Alignment:  Maintained. Vertebrae: Mild loss of vertebral body height at T7 and T9 is unchanged. Multifocal enhancing lesions consistent with metastatic prostate carcinoma are again identified. Largest deposits are in T2, T8 and T11. The appearance is not markedly changed. Cord:  Normal signal throughout. Paraspinal and other soft tissues: Small bilateral pleural effusions are noted, larger on the left. Disc levels: Mild degenerative disc disease most notable at T6-7 there is a shallow right paracentral protrusion is unchanged. IMPRESSION: No change in the appearance of the thoracic spine since 10/02/2016. Scattered metastatic deposits in bone are again seen. The thoracic cord appears normal. There is mild spondylosis without central canal narrowing. Small bilateral pleural effusions, larger on the left. Electronically Signed   By: Inge Rise M.D.   On: 01/21/2017 14:43    Assessment and Plan:   76 year old gentleman with the following issues:  1.  Prostate cancer diagnosed in 2010.  He is status post prostatectomy completed by Dr. Alinda Money without any evidence to suggest recurrent disease.  His PSA in September 2018 was undetectable.  The natural course of this disease was reviewed with the patient as well as the multiple imaging studies that has been documented.  His MRI in November 2018 was reviewed and clearly shows no changes in the thoracic spine noted.  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  I discussed these findings with the patient and his wife today and felt that this is likely to be cancer.  To be 100% sure a biopsy  would be needed to make that differentiation.  I do not feel this is necessary at this time and he is unwilling to undergo any further testing unless absolutely necessary.  He felt that he had multiple tests that are not necessarily diagnostic at this time and unwilling to go any further testing unless needed to.   The likelihood of this is malignancy remains very low  given the stability over a period of months and a negative bone scan and PSA.  No further intervention is needed from an oncology standpoint.  2.  Polyneuropathy and weakness: This is unrelated to his history of prostate cancer.  Paraneoplastic manifestation of prostate cancer would be extremely unlikely.  3.  Follow-up: I am happy to see him in the future as needed.

## 2017-02-07 ENCOUNTER — Encounter: Payer: Self-pay | Admitting: Occupational Therapy

## 2017-02-07 NOTE — Therapy (Signed)
[facility] [address] [place], [place], [address] Phone: [phone]   Fax:  [phone]  Patient Details  Name: Ryker Sudbury MRN: [id number] Date of Birth: [birth year] Referring Provider:  No ref. provider found  Patient has not returned since initial evaluation, and has had progressively worsening symptoms.   No further OP OT warranted at this time.    Mariah Milling, OTR/L 02/07/2017, 5:33 PM  [facility] [address] [place], [place], [address] Phone: [phone]   Fax:  [phone]

## 2017-02-08 ENCOUNTER — Ambulatory Visit: Payer: Medicare Other | Admitting: Physical Therapy

## 2017-02-11 ENCOUNTER — Ambulatory Visit: Payer: Medicare Other | Admitting: Neurology

## 2017-02-13 ENCOUNTER — Ambulatory Visit: Payer: Medicare Other | Admitting: Neurology

## 2017-02-14 NOTE — Progress Notes (Signed)
Follow-up Visit   Date: 02/15/17    Eduardo Burch MRN: [id number] DOB: Aug 04, 1940   Interim History: Eduardo Burch is a 76 y.o. 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 The patient was accompanied to the clinic by wife who also provides collateral information.    History of present illness Starting around mid June 2017, patient started having severe low back pain followed by numbness of the buttocks region, thigh, and weakness of his upper legs. A few weeks later, he started having difficulty with climbing stairs and after being evaluated by his primary care doctor's office, was referred directly to the emergency room for evaluation. He was admitted to [facility] on 7/27 through 10/04/2015 and was seen by the neurology team with high clinical suspicion for Guillain-Barr Syndrome. Exam was notable for 4/5 bilateral lower extremity strength, diminished sensation in the legs, areflexia, and coarse action tremor in bilateral upper extremities. He underwent extensive testing including CSF analysis which showed albuminocytologic dissociation (CSF protein 215 with normal cell count and glucose) and he was started on IVIG for 5 days. Physical therapy assessed him for outpatient PT and recommended using a cane for assistance, which he was unwilling to use initially.   Imaging of the lumbar and thoracic spine with and without contrast did not show any nerve root enhancement, however did demonstrate osseous changes at L5, S1, and left ilium and multiple scattered hemangiomas throughout thoracic spine consistent with Paget's disease.  PET scan in June 2018 was negative, that this was not felt to be metastatic in nature.  He went to out-patient PT and continued to have steady declined and suffered a fall in  late August and started using a walker.  Because of progressive weakness, he was evaluated at [facility] by neurosurgeon Dr. Nikki Dom, who did not see anything surgical causing his symptoms. He had NCS/EMG of the right arm and leg at [facility] which showed active on chronic L4-L5 polyradiculoneuropathy.  He was then referred to see me by Dr. Vertell Limber due to his rapidly progressive weakness.  He saw me on 9/10 and as he was leaving his home to come to the appointment, his legs completely buckled and gave-out on him while he was trying to go down the steps of his front porch. He struggled to get into the car with his wife.  Once at our building, they used a wheelchair, but neither patient nor his wife feel that he is able to stand or get back into his car.  He also complains of difficulty swallowing and unable to take vitamin capsules.  His voice has also become hoarse. He saw ENT on 8/30 at who recommended MBS.   Of note, he has lost a remarkable 50lb over the past few months. He also complains of night sweats and chills.    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  Because of his progressive weight loss, paraneoplastic panel was also checked which returned negative. He completed 5 days of plasmapheresis which she felt improved his upper extremity strength, however no change with his leg strength. He also underwent CT chest, abdomen, pelvis which showed a circumferential colonic wall thickening near the rectosigmoid normal  junction, recommend colonoscopy which has not been scheduled.  Because of his progressive weakness despite IVIG and plasmapheresis, he underwent muscle biopsy on 12/13/2016 which results are not yet available. Nerve biopsy was unable to be performed while he was hospitalized. He had transient improvement  and ability to stand two days following his rehab and walk some, but since then has not been able to stand. He returned home from rehabilitation on October 13. Unfortunately, he legs remains profoundly weak and he is unable to stand or walk.  He has no appetite and drink 1-2 boost daily.    UPDATE 02/15/2017:    He completed Solu-Medrol 1 g for 5 days in November has not appreciated any improvement. He also saw oncology due to MRI findings concerning for prostate metastasis, however upon further review, these findings are most consistent with hemangiomas consistent with Paget's disease. There was very low clinical suspicion that he has a malignant process and did not wish to pursue biopsy of these lesions.  Over the past week, he started noticing weakness of the arms and hands.  He has new numbness over the finger tips and continues to have numbness in the feet.  There as been progressive muscle wasting in the arms and legs.  He denies any shortness of breath or difficulty talking.  He has some difficulty swallowing, which has been stable.    He had on ER visit on 11/19 due to new band-like chest pain.  MRI thoracic spine did not show any spinal cord pathology. He takes gabapentin 346m at bedtime for this.  Low back pain is better with tizanidine   Medications:  Current Outpatient Medications on File Prior to Visit  Medication Sig Dispense Refill  . acetaminophen (TYLENOL) 500 MG tablet Take 500-1,000 mg by mouth every 6 (six) hours as needed for headache.    .Marland Kitchenaspirin 81 MG chewable tablet Chew 1 tablet (81 mg total) by mouth daily. 30 tablet 0  . bisacodyl (DULCOLAX) 5 MG EC tablet Take 1 tablet (5 mg total) by mouth daily as needed for moderate constipation. (Patient taking differently: Take 5 mg daily as needed by mouth for mild constipation. ) 30 tablet 0  . cloNIDine (CATAPRES) 0.1 MG tablet Take 0.1 mg by mouth 2 (two) times daily.    . cyclobenzaprine (FLEXERIL) 5 MG tablet Take 1 tablet  (5 mg total) by mouth 3 (three) times daily as needed for muscle spasms. 30 tablet 0  . docusate sodium (COLACE) 100 MG capsule Take 1 capsule (100 mg total) by mouth every 12 (twelve) hours. (Patient taking differently: Take 100 mg by mouth every other day. ) 60 capsule 0  . gabapentin (NEURONTIN) 300 MG capsule Take 1 capsule (300 mg total) by mouth at bedtime. 30 capsule 0  . methylPREDNISolone sodium succinate (SOLU-MEDROL) 1000 MG injection Inject 1,000 mg daily into the vein. x5 days    . ondansetron (ZOFRAN) 8 MG tablet Take by mouth every 8 (eight) hours as needed for nausea or vomiting.    .Marland Kitchentetrahydrozoline (VISINE) 0.05 % ophthalmic solution Place 1-2 drops 2 (two) times daily as needed into both eyes (dry eyes.).    .Marland KitchentiZANidine (ZANAFLEX) 2 MG tablet Take 1 tablet (2 mg total) by mouth 3 (three) times daily. (Patient taking differently: Take 2 mg 2 (two) times daily by mouth. ) 30 tablet 5  . traMADol (ULTRAM) 50 MG tablet Take 1 tablet (50 mg total) by mouth every 6 (six) hours as needed for moderate  pain or severe pain. 30 tablet 0  . traZODone (DESYREL) 50 MG tablet Take 1 tablet (50 mg total) by mouth at bedtime. (Patient taking differently: Take 50 mg at bedtime as needed by mouth for sleep. ) 30 tablet 0  . Zoledronic Acid (RECLAST IV) Inject into the vein See admin instructions. Yearly     Current Facility-Administered Medications on File Prior to Visit  Medication Dose Route Frequency Provider Last Rate Last Dose  . methylPREDNISolone sodium succinate (SOLU-MEDROL) 1,000 mg in sodium chloride 0.9 % 50 mL IVPB  1,000 mg Intravenous Daily Patel, Donika K, DO        Allergies:  Allergies  Allergen Reactions  . Remicade [Infliximab] Anaphylaxis    Review of Systems:  CONSTITUTIONAL: No fevers, chills, night sweats, +weight loss.  EYES: No visual changes or eye pain ENT: No hearing changes.  No history of nose bleeds.   RESPIRATORY: No cough, wheezing and shortness of  breath.   CARDIOVASCULAR: Negative for chest pain, and palpitations.   GI: Negative for abdominal discomfort, blood in stools or black stools.  No recent change in bowel habits.   GU:  No history of incontinence.   MUSCLOSKELETAL: No history of joint pain or swelling.  No myalgias.   SKIN: Negative for lesions, rash, and itching.   ENDOCRINE: Negative for cold or heat intolerance, polydipsia or goiter.   PSYCH:  No depression or anxiety symptoms.   NEURO: As Above.   Vital Signs:  BP 104/64   Pulse (!) 112   Resp 16    General: Thin-appearing, sitting in wheelchair  Neurological Exam: MENTAL STATUS including orientation to time, place, person, recent and remote memory, attention span and concentration, language, and fund of knowledge is normal.  Speech is not dysarthric.  CRANIAL NERVES: Pupils equal round and reactive to light.  Normal conjugate, extra-ocular eye movements in all directions of gaze.  No ptosis.  Face is symmetric. There is no facial weakness.  Palate elevates symmetrically.  Tongue is midline and strength is 5/5.  No fasciculations.  Snout and palmomental reflexes are absent.  Myerson's sign is present.   MOTOR:  Progressive and severe muscle atrophy of the arms and legs. I do not see any fasciculations.  Intermittent coarse tremor of the hands.  Tone is normal and reduced in the legs.    Right Upper Extremity:    Left Upper Extremity:    Deltoid  4/5   Deltoid  4/5   Biceps  4+/5   Biceps  4+/5   Triceps  4/5   Triceps  4/5   Wrist extensors  5/5   Wrist extensors  5/5   Wrist flexors  5/5   Wrist flexors  5/5   Finger extensors  5/5   Finger extensors  5/5   Finger flexors  5/5   Finger flexors  5/5   Dorsal interossei  4/5   Dorsal interossei * 4/5   Abductor pollicis  4/5   Abductor pollicis  4/5   Tone (Ashworth scale)  0  Tone (Ashworth scale)  0   Right Lower Extremity:    Left Lower Extremity:    Hip flexors  2/5   Hip flexors  2/5   Hip extensors  3/5    Hip extensors  3/5   Adductor 3/5  Adductor 3/5  Knee flexors  2/5   Knee flexors  2/5   Knee extensors  2/5   Knee extensors  2/5   Dorsiflexors  2/5   Dorsiflexors  2/5   Plantarflexors  2/5   Plantarflexors  2/5   Toe extensors  2/5   Toe extensors  2/5   Toe flexors  2/5   Toe flexors  2/5   Tone (Ashworth scale)  0  Tone (Ashworth scale)  0   MSRs:  Diffusely arreflexic  SENSORY:  Vibration is reduced at knees and MCP, trace at the ankles.   COORDINATION/GAIT: Gait is not tested, patient wheelchair bound.  Data: CSF 09/28/2016: R2 W3 P215** G55  IgG index 0.7  OCB none  HSV, fungal cultures negative  Labs 11/12/2016: Paraneoplastic and sensorimotor autoimmune neuropathy panel negative; folate 26, ESR 47, CRP 0.8, vitamin B 12 495, vitamin B 139, Lyme negative, copper 108, SPEP suggestive of subacute inflammatory response Labs 12/21/2016:  ANA, RF, SSA/A, ACE, c-ANCA/p-ANCA, cryoglobulumin, SPEP with IFE, CK 29, aldolase, heavy metal screen all negative  MRI pelvis 09/12/2016: This addendum is given for the purpose of noting the L5-S1 level is autologously fused as seen on the prior CT scan. There is mild degenerative endplate edema in the superior endplate of L5. Disc bulge with endplate spur at Z1-I4 does not appear to cause central canal stenosis.  MRI lumbar spine wwo contrast 09/24/2016: 1. No aggressive osseous lesion to suggest metastatic disease. 2. Osseous changes at L5, S1 and left ilium most compatible with Paget's disease. 3. Lumbar spine spondylosis as described above.  CT head 09/28/2016: Negative for acute hemorrhage. Mild atrophy with asymmetric enlargement of the left lateral ventricle temporal horn. Left temporal horn enlargement is of uncertain etiology.  MRI thoracic spine wwo contrast 10/02/2016: 1. Normal spinal cord signal. No enhancement. 2. 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 Two lesions in the T6 and T11 vertebral bodies  demonstrate hypointensity on T1, and are favored to represent atypical hemangiomas over osseous metastatic disease given recent negative bone scan. Correlation with PSA is recommended. Also consider short-term follow-up MRI in 3-6 months. 3. Mild chronic anterior wedging of the T7 through T9 vertebral bodies.  NCS/EMG of the right arm and leg 10/08/2016:  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  No evidence of large fiber polyneuropathy of myopathy. [personally reviewed, only right sural and ulnar sensory responses were checked which were normal; motor - R ulnar, R tibial, R peroneal is normal; bilateral tibial H is absent.  Needle of genioglossus, deltoid, FDI is normal; R TP, TA, VL with active denervation; lumbar PSP normal]  MRI brain wwo contrast 10/26/2016:  Negative  MRI cervical spine wwo 10/26/2016:  Heterogeneous marrow signal without evidence of focal cervical spine osseous lesion. A benign osseous hemangioma is noted in the T2 vertebral  body.  CT chest, abdomen pelvos wwo contrast 11/20/2016:  1. No findings typical for metastatic prostate cancer in the chest, abdomen or pelvis. 2. Potential areas of circumferential colonic wall thickening near the rectosigmoid junction. These could be secondary to peristalsis. Correlation with the patient's colon cancer screening history is recommended. If screening is not up-to-date, appropriate screening/sigmoidoscopy should be considered to exclude neoplasm. 3. Clustered right upper lobe pulmonary nodularity, likely postinflammatory. 4. 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 Recommend annual imaging followup by CTA or MRA. This recommendation follows 2010 ACCF/AHA/AATS/ACR/ASA/SCA/SCAI/SIR/STS/SVM Guidelines for the Diagnosis and Management of Patients with Thoracic Aortic Disease.Circulation. 2010; 121: e266-e369 5. Additional incidental findings including cholelithiasis, colonic diverticulosis and Paget's disease in  the left pelvis.  Muscle biopsy 10/11/22018:  neurogenic atrophy, no evidence of inflammation  or infiltrative disease.  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  This is a very complex case without identifiable cause.  He initially developed isolated leg weakness and low back pain in June and found to have markedly elevated CSF protein and treated for likely GBS with IVIG and did well for about month.  In September, his legs started getting weaker and he was readmitted for plasmapheresis 5 with improvement of hand strength only due to concern of motor variant of CIDP.  Unfortunately, motor deficits in the legs remained severe, so he was offered 5-day course of Solumedrol in November.  Because he did not improve traditional therapies for CIDP, he underwent muscle biopsy which shows neurogenic atrophy, no evidence of inflammation or infiltrative disease  Work-up has been extensive and includes albuminocytologic dissociation on CSF (protein 215);  NCS/EMG at [facility] showed normal sensory responses and needle examination showed subacute to chronic L4-L5 polyradiculopathy. Laboratory testing for vitamin deficiency, autoimmune disease, paraproteinemias, paraneoplastic panel and sensorimotor autoimmune neuropathy panel is negative.    Thus far, I have been treating him for immune-mediated polyneuropathy/CIDP variant with IVIG, PLEX, and corticosteroids, but he has not responded raising the need to reassess his diagnosis. Clinically, he now had weakness of the arms, worsening weakness of the legs, and sensory complaints.  I recommend to repeat electrodiagnostic testing and/or sural nerve biopsy.  He is very reluctant to proceed with any further testing.  At this juncture, I do not have any specific treatment options without understanding the underlying etiology.  I have offered  him a second opinion at a tertiary care center because of the complexity of this case and unclear etiology for his progress neuropathy, which he declined.   I empathized with patient and can understand his frustration with the lack of definitive diagnosis and treatment plan. For now, continue supportive care.  For his pain, increase gabapentin to 382m twice daily and titrate as needed.  Greater than 50% of this 30 minute visit was spent in counseling, explanation of diagnosis, planning of further management, and coordination of care due to the complexity of his condition.   Thank you for allowing me to participate in patient's care.  If I can answer any additional questions, I would be pleased to do so.    Sincerely,    Donika K. PPosey Pronto DO

## 2017-02-15 ENCOUNTER — Ambulatory Visit: Payer: Medicare Other | Admitting: Neurology

## 2017-02-15 ENCOUNTER — Encounter: Payer: Self-pay | Admitting: Neurology

## 2017-02-15 VITALS — BP 104/64 | HR 112 | Resp 16

## 2017-02-15 DIAGNOSIS — G619 Inflammatory polyneuropathy, unspecified: Secondary | ICD-10-CM

## 2017-02-15 DIAGNOSIS — G622 Polyneuropathy due to other toxic agents: Secondary | ICD-10-CM | POA: Diagnosis not present

## 2017-04-05 DEATH — deceased

## 2017-08-09 IMAGING — CT CT HEAD W/O CM
4 series · 16 of 47 positions shown, 18 images · non-contrast
Comparison: None.

CLINICAL DATA: Lower extremity weakness. Concern for
Jaylon.

EXAM:
CT HEAD WITHOUT CONTRAST
TECHNIQUE: Contiguous axial images were obtained from the base of the skull
through the vertex without intravenous contrast.

[Series 3: head without · axial · non-contrast · 0.47mm/px · z∈[-131,-6]mm · 7 of 35 slices shown, 9 images]
[im 5/35  brain]
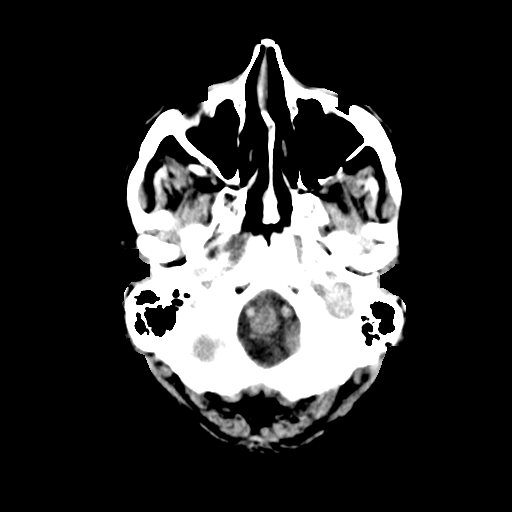
[im 5/35  bone]
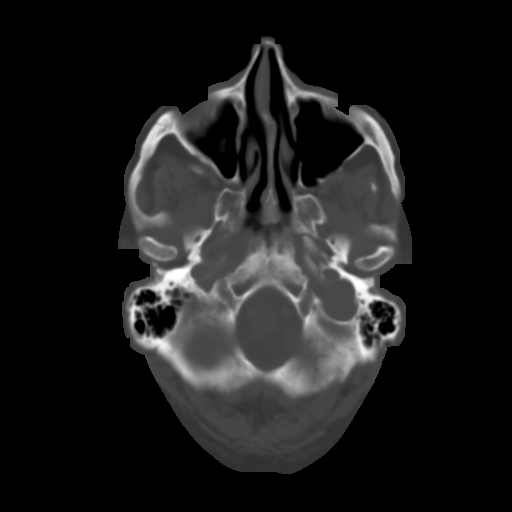
[im 9/35  brain]
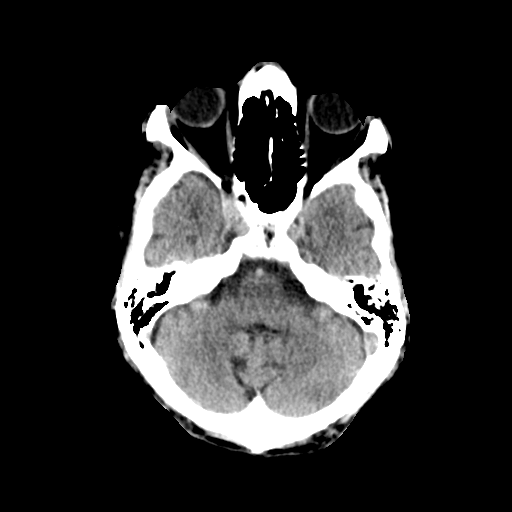
[im 13/35  brain]
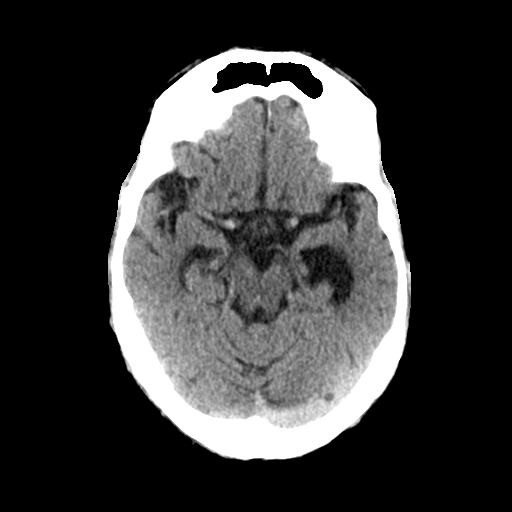
[im 18/35  brain]
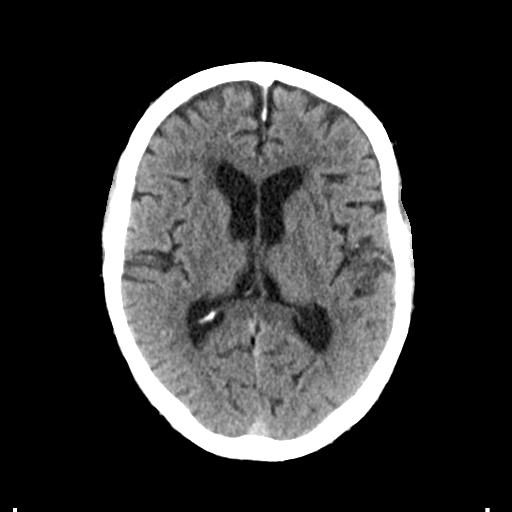
[im 22/35  brain]
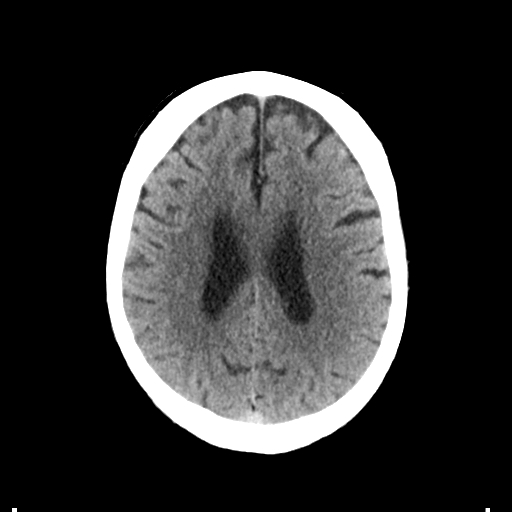
[im 22/35  bone]
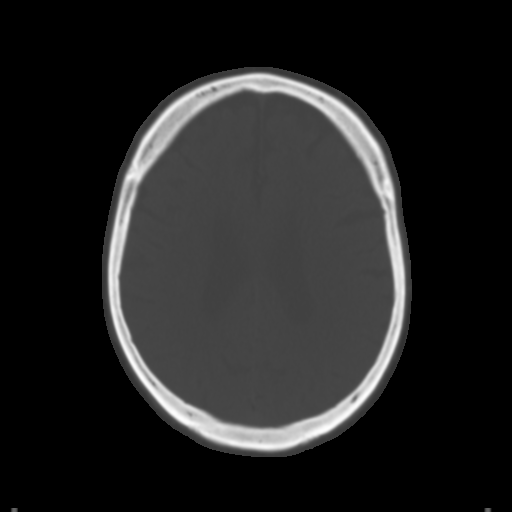
[im 26/35  brain]
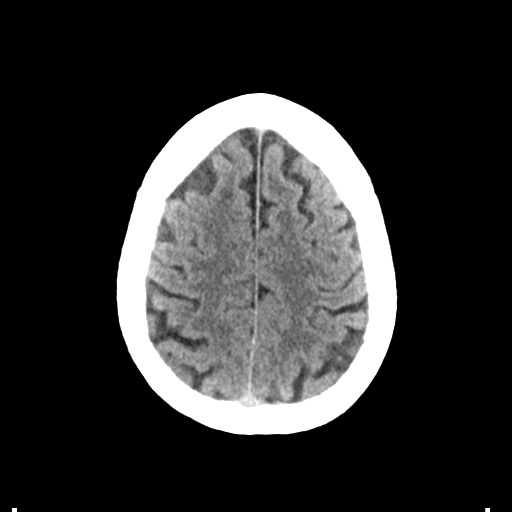
[im 30/35  brain]
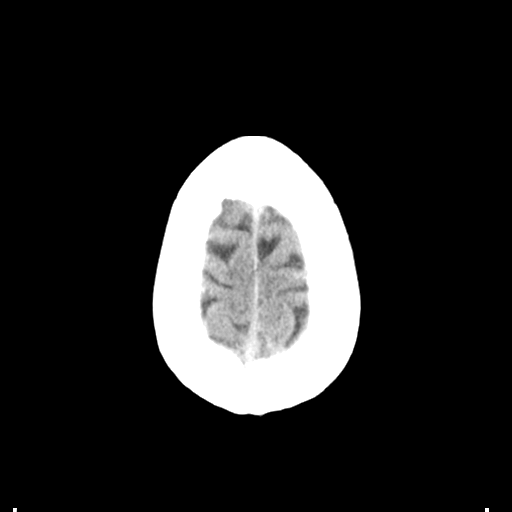

[Series 4: head bone · axial · 0.47mm/px · z∈[-135,-101]mm · 3 of 86 slices shown]
[im 9/86  bone]
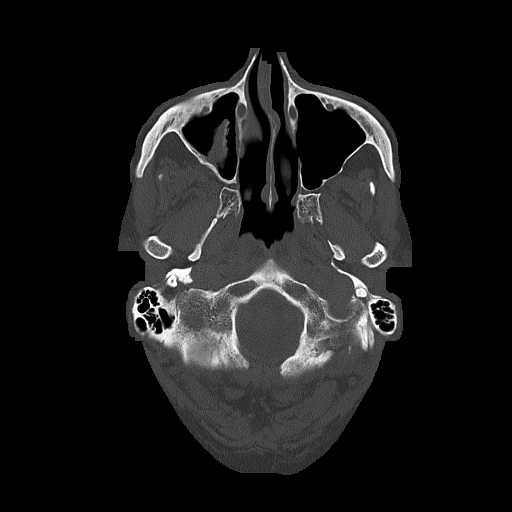
[im 18/86  bone]
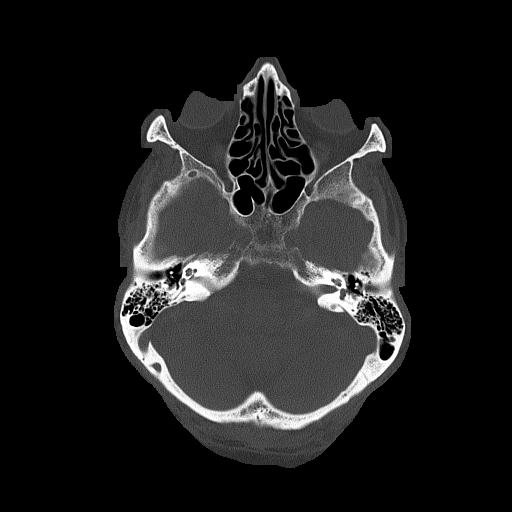
[im 26/86  bone]
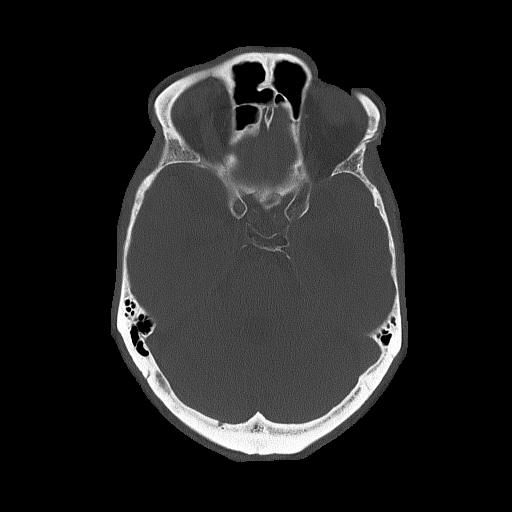

[Series 5: head without cor · coronal · non-contrast · 0.33mm/px · 3 of 75 slices shown]
[im 25/75  brain]
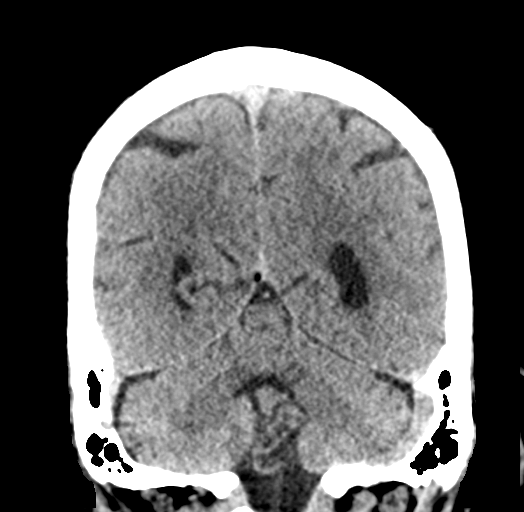
[im 33/75  brain]
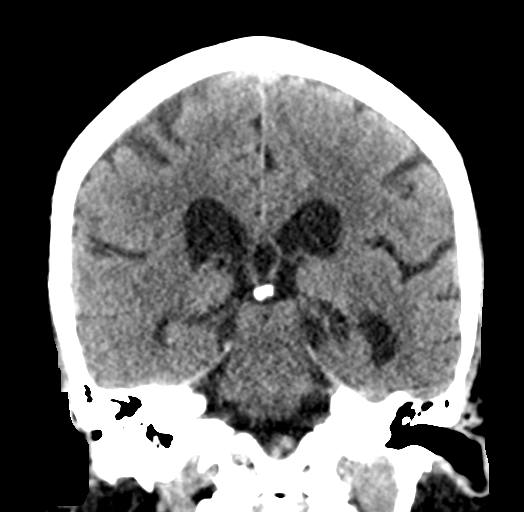
[im 42/75  brain]
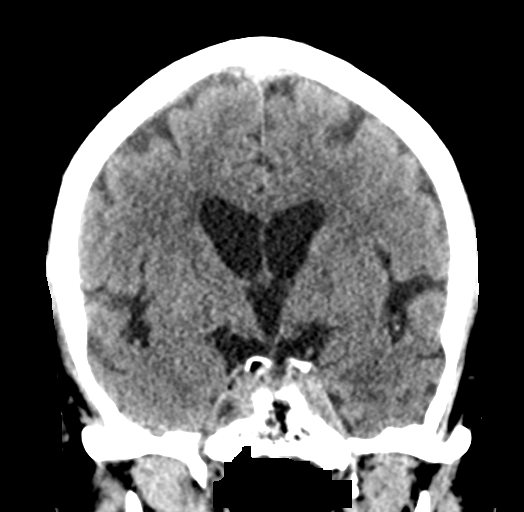

[Series 6: head without sag · sagittal · non-contrast · 0.36mm/px · 3 of 67 slices shown]
[im 23/67  brain]
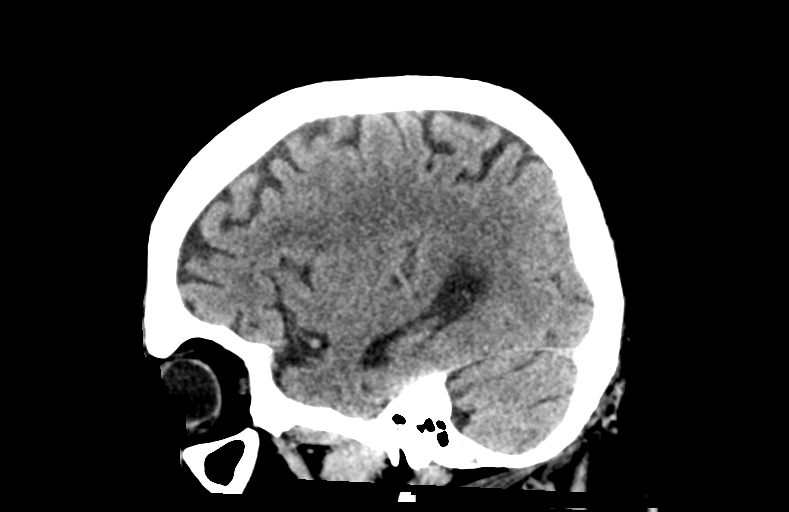
[im 34/67  brain]
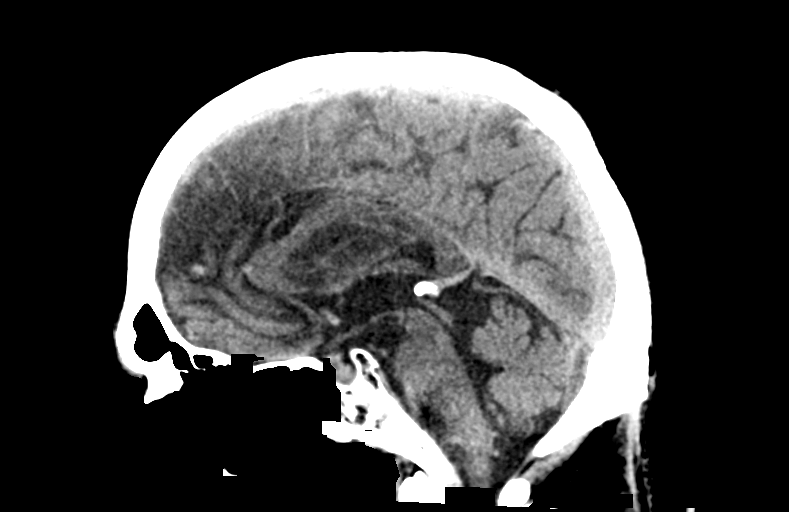
[im 45/67  brain]
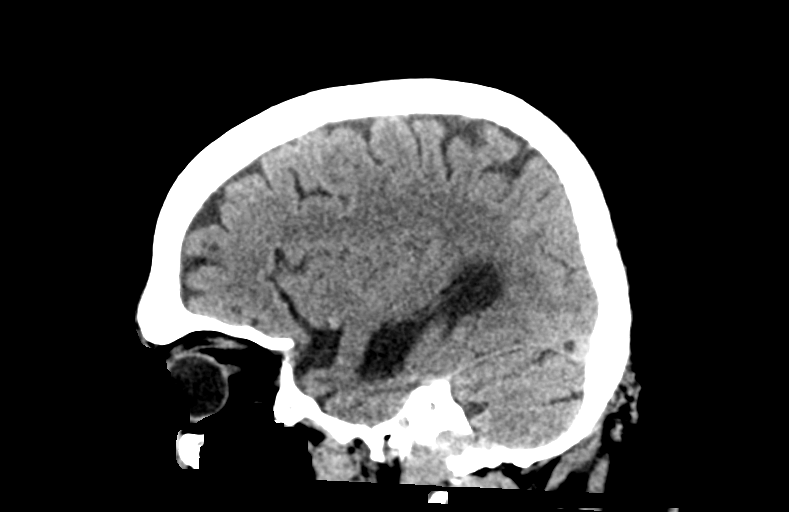

[16 of 47 positions shown; findings below may reference images not displayed]

FINDINGS: Brain: No evidence for acute hemorrhage, mass lesion, midline shift
or large infarct. Evidence for mild atrophy with some low density in
the white matter suggesting chronic changes. Left temporal horn of
the left lateral ventricle is asymmetrically enlarged compared to
the right side.

Vascular: No hyperdense vessel or unexpected calcification.

Skull: Normal. Negative for fracture or focal lesion.

Sinuses/Orbits: Mucosal disease with bone thickening in the right
maxillary sinus. Findings are suggestive for paranasal sinus
disease.

Other: None.
IMPRESSION: Negative for acute hemorrhage.

Mild atrophy with asymmetric enlargement of the left lateral
ventricle temporal horn. Left temporal horn enlargement is of
uncertain etiology.
